# Patient Record
Sex: Male | Born: 1940 | Race: White | Hispanic: No | Smoking: Never smoker
Health system: Southern US, Community
[De-identification: ages and names within clinical notes are randomized; demographics above are authoritative.]

## PROBLEM LIST (undated history)

## (undated) DIAGNOSIS — K219 Gastro-esophageal reflux disease without esophagitis: Secondary | ICD-10-CM

## (undated) DIAGNOSIS — I2510 Atherosclerotic heart disease of native coronary artery without angina pectoris: Secondary | ICD-10-CM

## (undated) DIAGNOSIS — R55 Syncope and collapse: Secondary | ICD-10-CM

## (undated) DIAGNOSIS — G459 Transient cerebral ischemic attack, unspecified: Secondary | ICD-10-CM

## (undated) DIAGNOSIS — E785 Hyperlipidemia, unspecified: Secondary | ICD-10-CM

## (undated) DIAGNOSIS — I219 Acute myocardial infarction, unspecified: Secondary | ICD-10-CM

## (undated) DIAGNOSIS — I493 Ventricular premature depolarization: Secondary | ICD-10-CM

## (undated) DIAGNOSIS — I452 Bifascicular block: Secondary | ICD-10-CM

## (undated) HISTORY — DX: Bifascicular block: I45.2

## (undated) HISTORY — DX: Gastro-esophageal reflux disease without esophagitis: K21.9

## (undated) HISTORY — DX: Transient cerebral ischemic attack, unspecified: G45.9

## (undated) HISTORY — DX: Syncope and collapse: R55

## (undated) HISTORY — DX: Ventricular premature depolarization: I49.3

---

## 2003-09-03 HISTORY — PX: INGUINAL HERNIA REPAIR: SUR1180

## 2009-02-09 ENCOUNTER — Encounter: Payer: Self-pay | Admitting: Gastroenterology

## 2013-09-02 HISTORY — PX: CATARACT EXTRACTION: SUR2

## 2015-09-03 HISTORY — PX: INGUINAL HERNIA REPAIR: SUR1180

## 2015-10-12 DIAGNOSIS — L490 Exfoliation due to erythematous condition involving less than 10 percent of body surface: Secondary | ICD-10-CM | POA: Diagnosis not present

## 2015-10-12 DIAGNOSIS — R600 Localized edema: Secondary | ICD-10-CM | POA: Diagnosis not present

## 2015-10-12 DIAGNOSIS — I83812 Varicose veins of left lower extremities with pain: Secondary | ICD-10-CM | POA: Diagnosis not present

## 2015-11-09 DIAGNOSIS — I83812 Varicose veins of left lower extremities with pain: Secondary | ICD-10-CM | POA: Diagnosis not present

## 2015-11-09 DIAGNOSIS — R600 Localized edema: Secondary | ICD-10-CM | POA: Diagnosis not present

## 2015-11-09 DIAGNOSIS — L490 Exfoliation due to erythematous condition involving less than 10 percent of body surface: Secondary | ICD-10-CM | POA: Diagnosis not present

## 2015-11-21 DIAGNOSIS — E785 Hyperlipidemia, unspecified: Secondary | ICD-10-CM | POA: Diagnosis not present

## 2015-11-21 DIAGNOSIS — Z125 Encounter for screening for malignant neoplasm of prostate: Secondary | ICD-10-CM | POA: Diagnosis not present

## 2015-11-21 DIAGNOSIS — Z1389 Encounter for screening for other disorder: Secondary | ICD-10-CM | POA: Diagnosis not present

## 2015-11-21 DIAGNOSIS — Z0000 Encounter for general adult medical examination without abnormal findings: Secondary | ICD-10-CM | POA: Diagnosis not present

## 2015-11-21 DIAGNOSIS — Z9181 History of falling: Secondary | ICD-10-CM | POA: Diagnosis not present

## 2015-11-21 DIAGNOSIS — Z6825 Body mass index (BMI) 25.0-25.9, adult: Secondary | ICD-10-CM | POA: Diagnosis not present

## 2015-11-21 DIAGNOSIS — Z23 Encounter for immunization: Secondary | ICD-10-CM | POA: Diagnosis not present

## 2015-12-14 DIAGNOSIS — I83812 Varicose veins of left lower extremities with pain: Secondary | ICD-10-CM | POA: Diagnosis not present

## 2015-12-14 DIAGNOSIS — R600 Localized edema: Secondary | ICD-10-CM | POA: Diagnosis not present

## 2015-12-14 DIAGNOSIS — L490 Exfoliation due to erythematous condition involving less than 10 percent of body surface: Secondary | ICD-10-CM | POA: Diagnosis not present

## 2015-12-26 DIAGNOSIS — I83813 Varicose veins of bilateral lower extremities with pain: Secondary | ICD-10-CM | POA: Diagnosis not present

## 2016-01-02 DIAGNOSIS — I83812 Varicose veins of left lower extremities with pain: Secondary | ICD-10-CM | POA: Diagnosis not present

## 2016-01-09 DIAGNOSIS — I83812 Varicose veins of left lower extremities with pain: Secondary | ICD-10-CM | POA: Diagnosis not present

## 2016-01-11 DIAGNOSIS — I83812 Varicose veins of left lower extremities with pain: Secondary | ICD-10-CM | POA: Diagnosis not present

## 2016-01-11 DIAGNOSIS — L490 Exfoliation due to erythematous condition involving less than 10 percent of body surface: Secondary | ICD-10-CM | POA: Diagnosis not present

## 2016-02-06 DIAGNOSIS — I83812 Varicose veins of left lower extremities with pain: Secondary | ICD-10-CM | POA: Diagnosis not present

## 2016-02-12 DIAGNOSIS — H353130 Nonexudative age-related macular degeneration, bilateral, stage unspecified: Secondary | ICD-10-CM | POA: Diagnosis not present

## 2016-02-15 DIAGNOSIS — R600 Localized edema: Secondary | ICD-10-CM | POA: Diagnosis not present

## 2016-02-15 DIAGNOSIS — I83812 Varicose veins of left lower extremities with pain: Secondary | ICD-10-CM | POA: Diagnosis not present

## 2016-06-28 DIAGNOSIS — Z23 Encounter for immunization: Secondary | ICD-10-CM | POA: Diagnosis not present

## 2016-08-12 DIAGNOSIS — H353130 Nonexudative age-related macular degeneration, bilateral, stage unspecified: Secondary | ICD-10-CM | POA: Diagnosis not present

## 2016-08-12 DIAGNOSIS — H524 Presbyopia: Secondary | ICD-10-CM | POA: Diagnosis not present

## 2016-11-22 DIAGNOSIS — Z125 Encounter for screening for malignant neoplasm of prostate: Secondary | ICD-10-CM | POA: Diagnosis not present

## 2016-11-22 DIAGNOSIS — E785 Hyperlipidemia, unspecified: Secondary | ICD-10-CM | POA: Diagnosis not present

## 2016-11-22 DIAGNOSIS — Z6825 Body mass index (BMI) 25.0-25.9, adult: Secondary | ICD-10-CM | POA: Diagnosis not present

## 2016-11-22 DIAGNOSIS — Z0000 Encounter for general adult medical examination without abnormal findings: Secondary | ICD-10-CM | POA: Diagnosis not present

## 2017-02-17 DIAGNOSIS — H353130 Nonexudative age-related macular degeneration, bilateral, stage unspecified: Secondary | ICD-10-CM | POA: Diagnosis not present

## 2017-06-20 DIAGNOSIS — Z23 Encounter for immunization: Secondary | ICD-10-CM | POA: Diagnosis not present

## 2017-08-13 DIAGNOSIS — H353130 Nonexudative age-related macular degeneration, bilateral, stage unspecified: Secondary | ICD-10-CM | POA: Diagnosis not present

## 2017-08-13 DIAGNOSIS — H524 Presbyopia: Secondary | ICD-10-CM | POA: Diagnosis not present

## 2017-11-25 DIAGNOSIS — Z125 Encounter for screening for malignant neoplasm of prostate: Secondary | ICD-10-CM | POA: Diagnosis not present

## 2017-11-25 DIAGNOSIS — E785 Hyperlipidemia, unspecified: Secondary | ICD-10-CM | POA: Diagnosis not present

## 2017-11-25 DIAGNOSIS — Z0000 Encounter for general adult medical examination without abnormal findings: Secondary | ICD-10-CM | POA: Diagnosis not present

## 2017-11-25 DIAGNOSIS — Z6825 Body mass index (BMI) 25.0-25.9, adult: Secondary | ICD-10-CM | POA: Diagnosis not present

## 2018-02-02 DIAGNOSIS — H353131 Nonexudative age-related macular degeneration, bilateral, early dry stage: Secondary | ICD-10-CM | POA: Diagnosis not present

## 2018-06-24 DIAGNOSIS — Z23 Encounter for immunization: Secondary | ICD-10-CM | POA: Diagnosis not present

## 2019-01-27 DIAGNOSIS — H353130 Nonexudative age-related macular degeneration, bilateral, stage unspecified: Secondary | ICD-10-CM | POA: Diagnosis not present

## 2019-01-27 DIAGNOSIS — H524 Presbyopia: Secondary | ICD-10-CM | POA: Diagnosis not present

## 2019-02-01 DIAGNOSIS — Z0000 Encounter for general adult medical examination without abnormal findings: Secondary | ICD-10-CM | POA: Diagnosis not present

## 2019-02-01 DIAGNOSIS — E785 Hyperlipidemia, unspecified: Secondary | ICD-10-CM | POA: Diagnosis not present

## 2019-02-01 DIAGNOSIS — Z125 Encounter for screening for malignant neoplasm of prostate: Secondary | ICD-10-CM | POA: Diagnosis not present

## 2019-02-01 DIAGNOSIS — Z1331 Encounter for screening for depression: Secondary | ICD-10-CM | POA: Diagnosis not present

## 2019-04-20 ENCOUNTER — Encounter: Payer: Self-pay | Admitting: Gastroenterology

## 2019-06-08 DIAGNOSIS — Z1331 Encounter for screening for depression: Secondary | ICD-10-CM | POA: Diagnosis not present

## 2019-06-08 DIAGNOSIS — Z125 Encounter for screening for malignant neoplasm of prostate: Secondary | ICD-10-CM | POA: Diagnosis not present

## 2019-06-08 DIAGNOSIS — Z9181 History of falling: Secondary | ICD-10-CM | POA: Diagnosis not present

## 2019-06-08 DIAGNOSIS — Z0000 Encounter for general adult medical examination without abnormal findings: Secondary | ICD-10-CM | POA: Diagnosis not present

## 2019-06-12 DIAGNOSIS — Z23 Encounter for immunization: Secondary | ICD-10-CM | POA: Diagnosis not present

## 2019-08-25 DIAGNOSIS — H353132 Nonexudative age-related macular degeneration, bilateral, intermediate dry stage: Secondary | ICD-10-CM | POA: Diagnosis not present

## 2020-02-02 DIAGNOSIS — Z0000 Encounter for general adult medical examination without abnormal findings: Secondary | ICD-10-CM | POA: Diagnosis not present

## 2020-02-02 DIAGNOSIS — E785 Hyperlipidemia, unspecified: Secondary | ICD-10-CM | POA: Diagnosis not present

## 2020-02-23 DIAGNOSIS — H524 Presbyopia: Secondary | ICD-10-CM | POA: Diagnosis not present

## 2021-02-05 DIAGNOSIS — Z6824 Body mass index (BMI) 24.0-24.9, adult: Secondary | ICD-10-CM | POA: Diagnosis not present

## 2021-02-05 DIAGNOSIS — Z0000 Encounter for general adult medical examination without abnormal findings: Secondary | ICD-10-CM | POA: Diagnosis not present

## 2021-04-04 DIAGNOSIS — H353132 Nonexudative age-related macular degeneration, bilateral, intermediate dry stage: Secondary | ICD-10-CM | POA: Diagnosis not present

## 2021-04-04 DIAGNOSIS — H524 Presbyopia: Secondary | ICD-10-CM | POA: Diagnosis not present

## 2021-06-18 DIAGNOSIS — Z9181 History of falling: Secondary | ICD-10-CM | POA: Diagnosis not present

## 2021-06-18 DIAGNOSIS — Z0000 Encounter for general adult medical examination without abnormal findings: Secondary | ICD-10-CM | POA: Diagnosis not present

## 2021-06-18 DIAGNOSIS — E785 Hyperlipidemia, unspecified: Secondary | ICD-10-CM | POA: Diagnosis not present

## 2021-06-18 DIAGNOSIS — Z1331 Encounter for screening for depression: Secondary | ICD-10-CM | POA: Diagnosis not present

## 2021-06-22 DIAGNOSIS — Z23 Encounter for immunization: Secondary | ICD-10-CM | POA: Diagnosis not present

## 2021-10-09 DIAGNOSIS — Z961 Presence of intraocular lens: Secondary | ICD-10-CM | POA: Diagnosis not present

## 2021-10-09 DIAGNOSIS — H353132 Nonexudative age-related macular degeneration, bilateral, intermediate dry stage: Secondary | ICD-10-CM | POA: Diagnosis not present

## 2021-10-09 DIAGNOSIS — H524 Presbyopia: Secondary | ICD-10-CM | POA: Diagnosis not present

## 2021-10-09 DIAGNOSIS — H43393 Other vitreous opacities, bilateral: Secondary | ICD-10-CM | POA: Diagnosis not present

## 2021-11-11 DIAGNOSIS — G930 Cerebral cysts: Secondary | ICD-10-CM | POA: Diagnosis not present

## 2021-11-11 DIAGNOSIS — I6603 Occlusion and stenosis of bilateral middle cerebral arteries: Secondary | ICD-10-CM | POA: Diagnosis not present

## 2021-11-11 DIAGNOSIS — G459 Transient cerebral ischemic attack, unspecified: Secondary | ICD-10-CM | POA: Diagnosis not present

## 2021-11-11 DIAGNOSIS — R531 Weakness: Secondary | ICD-10-CM | POA: Diagnosis not present

## 2021-11-11 DIAGNOSIS — I361 Nonrheumatic tricuspid (valve) insufficiency: Secondary | ICD-10-CM | POA: Diagnosis not present

## 2021-11-11 DIAGNOSIS — Z7982 Long term (current) use of aspirin: Secondary | ICD-10-CM | POA: Diagnosis not present

## 2021-11-11 DIAGNOSIS — I340 Nonrheumatic mitral (valve) insufficiency: Secondary | ICD-10-CM | POA: Diagnosis not present

## 2021-11-11 DIAGNOSIS — R55 Syncope and collapse: Secondary | ICD-10-CM | POA: Diagnosis not present

## 2021-11-12 DIAGNOSIS — G930 Cerebral cysts: Secondary | ICD-10-CM | POA: Diagnosis not present

## 2021-11-12 DIAGNOSIS — R531 Weakness: Secondary | ICD-10-CM | POA: Diagnosis not present

## 2021-11-12 DIAGNOSIS — G459 Transient cerebral ischemic attack, unspecified: Secondary | ICD-10-CM | POA: Diagnosis not present

## 2021-11-12 DIAGNOSIS — R29898 Other symptoms and signs involving the musculoskeletal system: Secondary | ICD-10-CM | POA: Diagnosis not present

## 2021-11-12 DIAGNOSIS — R55 Syncope and collapse: Secondary | ICD-10-CM | POA: Diagnosis not present

## 2021-11-14 DIAGNOSIS — R55 Syncope and collapse: Secondary | ICD-10-CM | POA: Diagnosis not present

## 2021-11-14 DIAGNOSIS — I452 Bifascicular block: Secondary | ICD-10-CM | POA: Diagnosis not present

## 2021-11-14 DIAGNOSIS — G459 Transient cerebral ischemic attack, unspecified: Secondary | ICD-10-CM | POA: Diagnosis not present

## 2021-11-14 DIAGNOSIS — I493 Ventricular premature depolarization: Secondary | ICD-10-CM | POA: Diagnosis not present

## 2021-11-20 ENCOUNTER — Encounter: Payer: Self-pay | Admitting: Cardiology

## 2021-12-06 ENCOUNTER — Encounter: Payer: Self-pay | Admitting: Cardiology

## 2021-12-06 ENCOUNTER — Ambulatory Visit: Payer: Medicare Other | Admitting: Cardiology

## 2021-12-06 ENCOUNTER — Ambulatory Visit (INDEPENDENT_AMBULATORY_CARE_PROVIDER_SITE_OTHER): Payer: Medicare Other

## 2021-12-06 VITALS — BP 136/78 | HR 90 | Ht 69.5 in | Wt 170.8 lb

## 2021-12-06 DIAGNOSIS — R079 Chest pain, unspecified: Secondary | ICD-10-CM | POA: Diagnosis not present

## 2021-12-06 DIAGNOSIS — I10 Essential (primary) hypertension: Secondary | ICD-10-CM

## 2021-12-06 DIAGNOSIS — I25709 Atherosclerosis of coronary artery bypass graft(s), unspecified, with unspecified angina pectoris: Secondary | ICD-10-CM | POA: Diagnosis not present

## 2021-12-06 DIAGNOSIS — I2510 Atherosclerotic heart disease of native coronary artery without angina pectoris: Secondary | ICD-10-CM

## 2021-12-06 DIAGNOSIS — R55 Syncope and collapse: Secondary | ICD-10-CM

## 2021-12-06 DIAGNOSIS — E785 Hyperlipidemia, unspecified: Secondary | ICD-10-CM | POA: Insufficient documentation

## 2021-12-06 DIAGNOSIS — I493 Ventricular premature depolarization: Secondary | ICD-10-CM | POA: Insufficient documentation

## 2021-12-06 HISTORY — DX: Atherosclerotic heart disease of native coronary artery without angina pectoris: I25.10

## 2021-12-06 HISTORY — DX: Hyperlipidemia, unspecified: E78.5

## 2021-12-06 HISTORY — DX: Ventricular premature depolarization: I49.3

## 2021-12-06 HISTORY — DX: Essential (primary) hypertension: I10

## 2021-12-06 MED ORDER — ROSUVASTATIN CALCIUM 10 MG PO TABS: 10.0000 mg | ORAL_TABLET | Freq: Every day | ORAL | 3 refills | Status: DC

## 2021-12-06 MED ORDER — NITROGLYCERIN 0.4 MG SL SUBL: 0.4000 mg | SUBLINGUAL_TABLET | SUBLINGUAL | 2 refills | Status: DC | PRN

## 2021-12-06 MED ORDER — METOPROLOL TARTRATE 50 MG PO TABS: 50.0000 mg | ORAL_TABLET | Freq: Once | ORAL | 0 refills | Status: DC

## 2021-12-06 MED ORDER — METOPROLOL TARTRATE 25 MG PO TABS: 25.0000 mg | ORAL_TABLET | Freq: Two times a day (BID) | ORAL | 3 refills | Status: DC

## 2021-12-06 NOTE — Patient Instructions (Signed)
Medication Instructions:  ?Your physician has recommended you make the following change in your medication:  ? ?START: Metoprolol Tartrate 25 mg twice daily ?START: Crestor 10 mg daily ?START: Nitroglycerin 0.4 mg place 1 tablet under the tongue every five minutes as needed for chest pain ?*If you need a refill on your cardiac medications before your next appointment, please call your pharmacy* ? ? ?Lab Work: ? ?BMP 1 week before CT ? ?If you have labs (blood work) drawn today and your tests are completely normal, you will receive your results only by: ?MyChart Message (if you have MyChart) OR ?A paper copy in the mail ?If you have any lab test that is abnormal or we need to change your treatment, we will call you to review the results. ? ? ?Testing/Procedures: ?A zio monitor was ordered today. It will remain on for 14 days. You will then return monitor and event diary in provided box. It takes 1-2 weeks for report to be downloaded and returned to [place]. We will call you with the results. If monitor falls off or has orange flashing light, please call Zio for further instructions.  ? ? ? ?Your cardiac CT will be scheduled at one of the below locations:  ? ?[facility] ?[address] ?[place], [place] [address] ?[phone] ? ?OR ? ?[facility] [address] ?[address] ?[place],  [address] ?[phone] ? ?If scheduled at [facility], please arrive at the [facility] and Children's Entrance (Entrance C2) of [facility] 30 minutes prior to test start time. ?You can use the FREE valet parking offered at entrance C (encouraged to control the heart rate for the test)  ?Proceed to the [facility] Radiology Department (first floor) to check-in and test prep. ? ?All radiology patients and guests should use entrance C2 at [facility], accessed from [facility], even though the hospital's physical address listed is [address] ? ? ? ?If scheduled at [facility], please arrive 15 mins early for check-in and test prep. ? ?Please follow these instructions carefully (unless otherwise directed): ? ?On the Night Before the Test: ?Be sure to Drink plenty of water. ?Do not consume any caffeinated/decaffeinated beverages or chocolate 12 hours prior to your test. ?Do not take any antihistamines 12 hours prior to your test. ? ?On the Day of the Test: ?Drink plenty of water until 1 hour prior to the test. ?Do not eat any food 4 hours prior to the test. ?You may take your regular medications prior to the test.  ?Take metoprolol (Lopressor) two hours prior to test.   ? ?     ?After the Test: ?Drink plenty of water. ?After receiving IV contrast, you may experience a mild flushed feeling. This is normal. ?On occasion, you may experience a mild rash up to 24 hours after the test. This is not dangerous. If this occurs, you can take Benadryl 25 mg and increase your fluid intake. ?If you experience trouble breathing, this can be serious. If it is severe call 911 IMMEDIATELY. If it is mild, please call our office. ? ?We will call to schedule your test 2-4 weeks out understanding that some insurance companies will need an authorization prior to the service being performed.  ? ?For non-scheduling related questions, please contact the cardiac imaging nurse navigator should you have any questions/concerns: ?Marchia Bond, Cardiac Imaging Nurse Navigator ?Gordy Clement, Cardiac Imaging Nurse Navigator ?[facility] ?Direct Office Dial: [phone]  ? ?For scheduling needs, including cancellations and rescheduling, please call [place], [phone]. ? ? ? ?Follow-Up: ?At [facility], you and your health needs are our priority.  As part of our continuing mission to provide you with exceptional heart care, we have created designated Provider Care Teams.  These Care Teams include your primary  Cardiologist (physician) and Advanced Practice Providers (APPs -  Physician Assistants and Nurse Practitioners) who all work together to provide you with the care you need, when you need it. ? ?We recommend signing up for the patient portal called "MyChart".  Sign up information is provided on this After Visit Summary.  MyChart is used to connect with patients for Virtual Visits (Telemedicine).  Patients are able to view lab/test results, encounter notes, upcoming appointments, etc.  Non-urgent messages can be sent to your provider as well.   ?To learn more about what you can do with MyChart, go to [email or web address].   ? ?Your next appointment:   ?2 month(s) ? ?The format for your next appointment:   ?In Person ? ?Provider:   ?Jenne Campus, MD  ? ? ?Other Instructions ?None ? ?

## 2021-12-06 NOTE — Progress Notes (Signed)
? ?Cardiology Consultation:   ? ?Date:  12/06/2021  ? ?ID:  Roger Smith, DOB 1941/08/21, MRN [id number] ? ?PCP:  Paulina FusiSchultz, Douglas E, MD  ?Cardiologist:  Gypsy Balsamobert Skarleth Delmonico, MD  ? ?Referring MD: Paulina FusiSchultz, Douglas E, MD  ? ?Chief Complaint  ?Patient presents with  ? Chest Pain  ? Loss of Consciousness  ?  11/11/21 first episode/ Ongoing chest pain for 3 weeks   ? ? ?History of Present Illness:   ? ?Roger Smith is a 81 y.o. male who is being seen today for the evaluation of syncope at the request of Paulina FusiSchultz, Douglas E, MD. past medical history significant for essential hypertension, dyslipidemia, right bundle branch block, recently he ended up being in the hospital because of episode of syncope.  It happened on Sunday he went to church he ate some 2 donuts before he went there then he was sitting for about 1 hour listening to preacher preaching after that he got up to sink he became dizzy swimmy headed for couple seconds and eventually ended up sitting down and then passed out.  When he woke up a lot of people were around him and he was profoundly sweating he did not bite his tongue he did not wet himself.  He refused to go to hospital however his daughter convinced him to go in the hospital quite extensive evaluation has been done which included echocardiogram which show segmental wall motion abnormalities apparently he did have hypokinesis involving inferior wall.  Overall ejection fraction was 50%.  Interestingly EKG did not show evidence of Q-wave inferiorly.  He started get even more interesting.  He describes episodes of chest pain that happened with extreme exertion for example while he is cutting grass and has been going on for more than a year.  He stops that sensation goes away he describes fairly typical angina pectoris.  He never smoked, he is wife used to be my patient that she actually had severe cardiomyopathy in the pending heart transplant and leaflet that for more than 10 years eventually end up dying  from cancer that she develops complication of antirejection medications.  So he knows me very well.  He does not exercise on the regular basis but he still trying to be active and do activity of daily living. ? ?Past Medical History:  ?Diagnosis Date  ? Chronic GERD   ? Frequent PVCs   ? RBBB (right bundle branch block with left anterior fascicular block)   ? Syncope and collapse   ? TIA (transient ischemic attack)   ? ? ?Past Surgical History:  ?Procedure Laterality Date  ? CATARACT EXTRACTION Bilateral 2015  ? INGUINAL HERNIA REPAIR Right 2005  ? INGUINAL HERNIA REPAIR Left 2017  ? Dr. Dimas ChyleGimenez  ? ? ?Current Medications: ?Current Meds  ?Medication Sig  ? aspirin EC 81 MG tablet Take 81 mg by mouth daily. Swallow whole.  ? multivitamin-lutein (OCUVITE-LUTEIN) CAPS capsule Take 1 capsule by mouth 2 (two) times daily.  ? Omega-3 Fatty Acids (FISH OIL) 1200 MG CAPS Take 1,200 mg by mouth daily.  ?  ? ?Allergies:   Patient has no known allergies.  ? ?Social History  ? ?Socioeconomic History  ? Marital status: Widowed  ?  Spouse name: Not on file  ? Number of children: Not on file  ? Years of education: Not on file  ? Highest education level: Not on file  ?Occupational History  ? Not on file  ?Tobacco Use  ? Smoking status: Never  ?  Smokeless tobacco: Never  ?Substance and Sexual Activity  ? Alcohol use: Never  ? Drug use: Never  ? Sexual activity: Not on file  ?Other Topics Concern  ? Not on file  ?Social History Narrative  ? Not on file  ? ?Social Determinants of Health  ? ?Financial Resource Strain: Not on file  ?Food Insecurity: Not on file  ?Transportation Needs: Not on file  ?Physical Activity: Not on file  ?Stress: Not on file  ?Social Connections: Not on file  ?  ? ?Family History: ?The patient's family history includes Heart attack in his mother; Heart disease in his mother; Stomach cancer in his maternal grandfather. ?ROS:   ?Please see the history of present illness.    ?All 14 point review of systems negative  except as described per history of present illness. ? ?EKGs/Labs/Other Studies Reviewed:   ? ?The following studies were reviewed today: ?I did review record from hospital including echocardiogram showing inferior wall hypokinesis ejection fraction 50% ? ?EKG:  EKG is  ordered today.  The ekg ordered today demonstrates normal sinus rhythm, right bundle branch block, PVCs look like origin of this PVCs is RVOT, 1 ventricular couplets noted. ? ?Recent Labs: ?No results found for requested labs within last 8760 hours.  ?Recent Lipid Panel ?No results found for: CHOL, TRIG, HDL, CHOLHDL, VLDL, LDLCALC, LDLDIRECT ? ?Physical Exam:   ? ?VS:  BP 136/78 (BP Location: Left Arm, Patient Position: Sitting)   Pulse 90   Ht 5' 9.5" (1.765 m)   Wt 170 lb 12.8 oz (77.5 kg)   SpO2 96%   BMI 24.86 kg/m?    ? ?Wt Readings from Last 3 Encounters:  ?12/06/21 170 lb 12.8 oz (77.5 kg)  ?11/14/21 169 lb (76.7 kg)  ?  ? ?GEN:  Well nourished, well developed in no acute distress ?HEENT: Normal ?NECK: No JVD; No carotid bruits ?LYMPHATICS: No lymphadenopathy ?CARDIAC: RRR, no murmurs, no rubs, no gallops ?RESPIRATORY:  Clear to auscultation without rales, wheezing or rhonchi  ?ABDOMEN: Soft, non-tender, non-distended ?MUSCULOSKELETAL:  No edema; No deformity  ?SKIN: Warm and dry ?NEUROLOGIC:  Alert and oriented x 3 ?PSYCHIATRIC:  Normal affect  ? ?ASSESSMENT:   ? ?1. Coronary artery disease involving coronary bypass graft of native heart with angina pectoris (HCC)   ?2. Essential hypertension   ?3. Dyslipidemia   ?4. Syncope and collapse   ?5. Ventricular ectopy   ? ?PLAN:   ? ?In order of problems listed above: ? ?Coronary disease look like he may have had inferior wall myocardial infarction on top of that he described to have fairly typical angina pectoris which appears to be stable.  He is already on antiplatelet therapy which I will continue.  I will put him on metoprolol 25 mg twice daily, I will give him prescription for  nitroglycerin.  He will be scheduled to have coronary CT angio to have better understanding of the significance of those findings.  He also is also told to call 911 if pain is not relieved by nitroglycerin. ?Essential hypertension blood pressure seems to well controlled continue present management. 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  I will ask disease to put Zio patch for 2 weeks to see if he had any significant arrhythmia.  In the meantime we will use beta-blocker to manage palpitations. ?Dyslipidemia he is without any medications however  AST is look like he does have evidence of myocardial infarction I will put him on statin we will put him on Crestor 10 he is LDL was 128 HDL 51 this is a last year. ? ? ?Medication Adjustments/Labs and Tests Ordered: ?Current medicines are reviewed at length with the patient today.  Concerns regarding medicines are outlined above.  ?No orders of the defined types were placed in this encounter. ? ?No orders of the defined types were placed in this encounter. ? ? ?Signed, ?Georgeanna Lea, MD, [facility]. ?12/06/2021 11:03 AM    ?[facility] ?

## 2021-12-13 DIAGNOSIS — R079 Chest pain, unspecified: Secondary | ICD-10-CM | POA: Diagnosis not present

## 2021-12-13 DIAGNOSIS — I10 Essential (primary) hypertension: Secondary | ICD-10-CM | POA: Diagnosis not present

## 2021-12-13 DIAGNOSIS — I25709 Atherosclerosis of coronary artery bypass graft(s), unspecified, with unspecified angina pectoris: Secondary | ICD-10-CM | POA: Diagnosis not present

## 2021-12-13 DIAGNOSIS — E785 Hyperlipidemia, unspecified: Secondary | ICD-10-CM | POA: Diagnosis not present

## 2021-12-14 LAB — BASIC METABOLIC PANEL
BUN/Creatinine Ratio: 15 (ref 10–24)
BUN: 12 mg/dL (ref 8–27)
CO2: 26 mmol/L (ref 20–29)
Calcium: 10.5 mg/dL — ABNORMAL HIGH (ref 8.6–10.2)
Chloride: 106 mmol/L (ref 96–106)
Creatinine, Ser: 0.82 mg/dL (ref 0.76–1.27)
Glucose: 98 mg/dL (ref 70–99)
Potassium: 4.9 mmol/L (ref 3.5–5.2)
Sodium: 145 mmol/L — ABNORMAL HIGH (ref 134–144)
eGFR: 89 mL/min/{1.73_m2} (ref >59)

## 2021-12-19 ENCOUNTER — Telehealth (HOSPITAL_COMMUNITY): Payer: Self-pay | Admitting: *Deleted

## 2021-12-19 NOTE — Telephone Encounter (Signed)
Reaching out to patient to offer assistance regarding upcoming cardiac imaging study; pt verbalizes understanding of appt date/time, parking situation and where to check in, pre-test NPO status and medications ordered, and verified current allergies; name and call back number provided for further questions should they arise ? ?Larey Brick RN Navigator Cardiac Imaging ?[facility] ?[phone] office ?[phone] cell ? ?Patient to take 75mg  metoprolol tartrate two hours prior to his cardiac CT scan.  He is aware to arrive at 9:30am. ?

## 2021-12-20 ENCOUNTER — Encounter (HOSPITAL_COMMUNITY): Payer: Self-pay

## 2021-12-20 ENCOUNTER — Ambulatory Visit (HOSPITAL_COMMUNITY)
Admission: RE | Admit: 2021-12-20 | Discharge: 2021-12-20 | Disposition: A | Discharge status: Home / Self Care | Payer: Medicare Other | Source: Ambulatory Visit | Attending: Cardiology | Admitting: Cardiology

## 2021-12-20 DIAGNOSIS — I2510 Atherosclerotic heart disease of native coronary artery without angina pectoris: Secondary | ICD-10-CM | POA: Insufficient documentation

## 2021-12-20 DIAGNOSIS — R079 Chest pain, unspecified: Secondary | ICD-10-CM | POA: Diagnosis not present

## 2021-12-20 MED ORDER — NITROGLYCERIN 0.4 MG SL SUBL
0.8000 mg | SUBLINGUAL_TABLET | Freq: Once | SUBLINGUAL | Status: AC
  Administered 2021-12-20: 0.8 mg via SUBLINGUAL

## 2021-12-20 MED ORDER — NITROGLYCERIN 0.4 MG SL SUBL
SUBLINGUAL_TABLET | SUBLINGUAL | Status: AC
Start: 2021-12-20 — End: 2021-12-20
  Filled 2021-12-20: qty 2

## 2021-12-20 MED ORDER — IOHEXOL 350 MG/ML SOLN
100.0000 mL | Freq: Once | INTRAVENOUS | Status: AC | PRN
  Administered 2021-12-20: 100 mL via INTRAVENOUS

## 2021-12-21 ENCOUNTER — Telehealth: Payer: Self-pay

## 2021-12-21 NOTE — Telephone Encounter (Signed)
-----   Message from Georgeanna Lea, MD sent at 12/21/2021  8:24 AM EDT ----- ?Significantly abnormal coronary CT angio.  He need to have a follow-up appointment to talk about options most likely require cardiac catheterization ?

## 2021-12-21 NOTE — Telephone Encounter (Signed)
Patient came by in person, notified of the following per Dr. Raliegh Ip and reschedule on 04/26 at 12:40 ?

## 2021-12-26 ENCOUNTER — Encounter: Payer: Self-pay | Admitting: Cardiology

## 2021-12-26 ENCOUNTER — Ambulatory Visit: Payer: Medicare Other | Admitting: Cardiology

## 2021-12-26 VITALS — BP 110/52 | HR 48 | Ht 69.5 in | Wt 167.4 lb

## 2021-12-26 DIAGNOSIS — I10 Essential (primary) hypertension: Secondary | ICD-10-CM | POA: Diagnosis not present

## 2021-12-26 DIAGNOSIS — I493 Ventricular premature depolarization: Secondary | ICD-10-CM

## 2021-12-26 DIAGNOSIS — E785 Hyperlipidemia, unspecified: Secondary | ICD-10-CM | POA: Diagnosis not present

## 2021-12-26 DIAGNOSIS — I25118 Atherosclerotic heart disease of native coronary artery with other forms of angina pectoris: Secondary | ICD-10-CM | POA: Diagnosis not present

## 2021-12-26 NOTE — Addendum Note (Signed)
Addended by: Baldo Ash D on: 12/26/2021 02:12 PM ? ? Modules accepted: Orders ? ?

## 2021-12-26 NOTE — Patient Instructions (Addendum)
Medication Instructions:  ?No medication restrictions before procedure ? ?  ? ?*If you need a refill on your cardiac medications before your next appointment, please call your pharmacy* ? ? ?Lab Work: ?BMP, CBC today ? ?If you have labs (blood work) drawn today and your tests are completely normal, you will receive your results only by: ?MyChart Message (if you have MyChart) OR ?A paper copy in the mail ?If you have any lab test that is abnormal or we need to change your treatment, we will call you to review the results. ? ? ?Testing/Procedures: ? ?[facility] ?[facility] [place] ?[address] ?[place] [id number] ?Dept: [phone] ?Loc: [phone]: [phone] ? ?Roger Smith  12/26/2021 ? ?You are scheduled for a Cardiac Catheterization on Wednesday, May 3 with Dr. Verne Carrowhristopher McAlhany. ? ?1. Please arrive at the [facility]: [address] [place], [place] [address] at 9:30 AM(This time is two hours before your procedure to ensure your preparation). Free valet parking service is available.  ? ?Special note: Every effort is made to have your procedure done on time. Please understand that emergencies sometimes delay scheduled procedures. ? ?2. Diet: Do not eat solid foods after midnight.  You may have clear liquids until 5 AM upon the day of the procedure. ? ?3. Labs: You will need to have blood drawn on Wednesday, April 26 at [facility]: [address], [place] . You do not need to be fasting. ? ?4. Medication instructions in preparation for your procedure: ? ? Contrast Allergy: No ? ? ?*For reference purposes while preparing patient instructions.   ?Delete this med list prior to printing instructions for patient.* ? ? ?On the morning of your procedure, take Aspirin and any morning medicines NOT listed above.  You may use sips of water. ? ?5. Plan to go home the same day, you will only stay overnight if medically necessary. ?6.  You MUST have a responsible adult to drive you home. ?7. An adult MUST be with you the first 24 hours after you arrive home. ?8. Bring a current list of your medications, and the last time and date medication taken. ?9. Bring ID and current insurance cards. ?10.Please wear clothes that are easy to get on and off and wear slip-on shoes. ? ?Thank you for allowing us to care for you! ?  -- [facility]  ? ? ?Follow-Up: ?At [facility], you and your health needs are our priority.  As part of our continuing mission to provide you with exceptional heart care, we have created designated Provider Care Teams.  These Care Teams include your primary Cardiologist (physician) and Advanced Practice Providers (APPs -  Physician Assistants and Nurse Practitioners) who all work together to provide you with the care you need, when you need it. ? ?We recommend signing up for the patient portal called "MyChart".  Sign up information is provided on this After Visit Summary.  MyChart is used to connect with patients for Virtual Visits (Telemedicine).  Patients are able to view lab/test results, encounter notes, upcoming appointments, etc.  Non-urgent messages can be sent to your provider as well.   ?To learn more about what you can do with MyChart, go to [email or web address].   ? ?Your next appointment:   ?6 week(s) ? ?The format for your next appointment:   ?In Person ? ?Provider:   ?Gypsy Balsamobert Krasowski, MD ? ? ?Other Instructions ? ?Coronary Angiogram  With Stent ?Coronary angiogram with stent placement is a procedure to widen or open a narrow blood vessel of the heart (coronary artery). Arteries may become blocked by cholesterol buildup (plaques) in the lining of the artery wall. When a coronary artery becomes partially blocked, blood flow to that area decreases. This may lead to chest pain or a heart attack (myocardial infarction). ?A stent is a small piece of metal that looks like mesh or spring.  Stent placement may be done as treatment after a heart attack, or to prevent a heart attack if a blocked artery is found by a coronary angiogram. ?Let your health care provider know about: ?Any allergies you have, including allergies to medicines or contrast dye. ?All medicines you are taking, including vitamins, herbs, eye drops, creams, and over-the-counter medicines. ?Any problems you or family members have had with anesthetic medicines. ?Any blood disorders you have. ?Any surgeries you have had. ?Any medical conditions you have, including kidney problems or kidney failure. ?Whether you are pregnant or may be pregnant. ?Whether you are breastfeeding. ?What are the risks? ?Generally, this is a safe procedure. However, serious problems may occur, including: ?Damage to nearby structures or organs, such as the heart, blood vessels, or kidneys. ?A return of blockage. ?Bleeding, infection, or bruising at the insertion site. ?A collection of blood under the skin (hematoma) at the insertion site. ?A blood clot in another part of the body. ?Allergic reaction to medicines or dyes. ?Bleeding into the abdomen (retroperitoneal bleeding). ?Stroke (rare). ?Heart attack (rare). ?What happens before the procedure? ?Staying hydrated ?Follow instructions from your health care provider about hydration, which may include: ?Up to 2 hours before the procedure - you may continue to drink clear liquids, such as water, clear fruit juice, black coffee, and plain tea.  ?  ?Eating and drinking restrictions ?Follow instructions from your health care provider about eating and drinking, which may include: ?8 hours before the procedure - stop eating heavy meals or foods, such as meat, fried foods, or fatty foods. ?6 hours before the procedure - stop eating light meals or foods, such as toast or cereal. ?2 hours before the procedure - stop drinking clear liquids. ?Medicines ?Ask your health care provider about: ?Changing or stopping your regular  medicines. This is especially important if you are taking diabetes medicines or blood thinners. ?Taking medicines such as aspirin and ibuprofen. These medicines can thin your blood. Do not take these medicines unless your health care provider tells you to take them. ?Generally, aspirin is recommended before a thin tube, called a catheter, is passed through a blood vessel and inserted into the heart (cardiac catheterization). ?Taking over-the-counter medicines, vitamins, herbs, and supplements. ?General instructions ?Do not use any products that contain nicotine or tobacco for at least 4 weeks before the procedure. These products include cigarettes, e-cigarettes, and chewing tobacco. If you need help quitting, ask your health care provider. ?Plan to have someone take you home from the hospital or clinic. ?If you will be going home right after the procedure, plan to have someone with you for 24 hours. ?You may have tests and imaging procedures. ?Ask your health care provider: ?How your insertion site will be marked. Ask which artery will be used for the procedure. ?What steps will be taken to help prevent infection. These may include: ?Removing hair at the insertion site. ?Washing skin with a germ-killing soap. ?Taking antibiotic medicine. ?What happens during the procedure? ?An IV will be inserted into one of your veins. ?Electrodes  may be placed on your chest to monitor your heart rate during the procedure. ?You will be given one or more of the following: ?A medicine to help you relax (sedative). ?A medicine to numb the area (local anesthetic) for catheter insertion. ?A small incision will be made for catheter insertion. ?The catheter will be inserted into an artery using a guide wire. The location may be in your groin, your wrist, or the fold of your arm (near your elbow). ?An X-ray procedure (fluoroscopy) will be used to help guide the catheter to the opening of the heart arteries. ?A dye will be injected into the  catheter. X-rays will be taken. The dye helps to show where any narrowing or blockages are located in the arteries. ?Tell your health care provider if you have chest pain or trouble breathing. ?A tiny w

## 2021-12-26 NOTE — Progress Notes (Signed)
?Cardiology Office Note:   ? ?Date:  12/26/2021  ? ?ID:  Roger Smith, DOB [birth year], MRN [id number] ? ?PCP:  Nicoletta Dress, MD  ?Cardiologist:  Jenne Campus, MD   ? ?Referring MD: Nicoletta Dress, MD  ? ?Chief Complaint  ?Patient presents with  ? Results  ? ? ?History of Present Illness:   ? ?Roger Smith is a 81 y.o. male with past medical history significant for essential hypertension, dyslipidemia, right bundle branch block.  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  He described to have some chest pain with exertion.  That happen rather rarely I brought him back to the office today to talk about options for this situation. ? ?Past Medical History:  ?Diagnosis Date  ? Chronic GERD   ? Frequent PVCs   ? RBBB (right bundle branch block with left anterior fascicular block)   ? Syncope and collapse   ? TIA (transient ischemic attack)   ? ? ?Past Surgical History:  ?Procedure Laterality Date  ? CATARACT EXTRACTION Bilateral 2015  ? INGUINAL HERNIA REPAIR Right 2005  ? INGUINAL HERNIA REPAIR Left 2017  ? Dr. Pauletta Browns  ? ? ?Current Medications: ?Current Meds  ?Medication Sig  ? aspirin EC 81 MG tablet Take 81 mg by mouth daily. Swallow whole.  ? famotidine (PEPCID) 40 MG tablet Take 40 mg by mouth daily.  ? metoprolol tartrate (LOPRESSOR) 25 MG tablet Take 1 tablet (25 mg total) by mouth 2 (two) times daily.  ? multivitamin-lutein (OCUVITE-LUTEIN) CAPS capsule Take 1 capsule by mouth 2 (two) times daily.  ? nitroGLYCERIN (NITROSTAT) 0.4 MG SL tablet Place 1 tablet (0.4 mg total) under the tongue every 5 (five) minutes as needed for chest pain.  ? Omega-3 Fatty  Acids (FISH OIL) 1200 MG CAPS Take 1,200 mg by mouth daily.  ? rosuvastatin (CRESTOR) 10 MG tablet Take 1 tablet (10 mg total) by mouth daily.  ? [DISCONTINUED] metoprolol tartrate (LOPRESSOR) 50 MG tablet Take 1 tablet (50 mg total) by mouth once for 1 dose.  ?  ? ?Allergies:   Patient has no known allergies.  ? ?Social History  ? ?Socioeconomic History  ? Marital status: Widowed  ?  Spouse name: Not on file  ? Number of children: Not on file  ? Years of education: Not on file  ? Highest education level: Not on file  ?Occupational History  ? Not on file  ?Tobacco Use  ? Smoking status: Never  ? Smokeless tobacco: Never  ?Substance and Sexual Activity  ? Alcohol use: Never  ? Drug use: Never  ? Sexual activity: Not on file  ?Other Topics Concern  ? Not on file  ?Social History Narrative  ? Not on file  ? ?Social Determinants of Health  ? ?Financial Resource Strain: Not on file  ?Food Insecurity: Not on file  ?Transportation Needs: Not on file  ?Physical Activity: Not on file  ?Stress: Not on file  ?Social Connections: Not on file  ?  ? ?Family History: ?The patient's family history includes Heart attack in his mother; Heart disease in his mother; Stomach cancer in his maternal grandfather. ?ROS:   ?Please see the history of present  illness.    ?All 14 point review of systems negative except as described per history of present illness ? ?EKGs/Labs/Other Studies Reviewed:   ? ?Coronary CT angio showed: ?RCA is a large dominant artery that gives rise to PDA and PLA. There ?is mixed plaque in the proximal portion of this artery with severe ?stenosis of 70-99%. Mid portion of this artery has soft plaques with ?moderate stenosis of 50-69%. ?  ?Left main is a large artery that gives rise to LAD and LCX arteries ?as well as small IM branch. This artery has calcified plaque with ?minimal stenosis of 0-25%. ?  ?LAD is a large vessel that has no large mixed plaque in the proximal ?portion with severe stenosis of 70-99%. This  artery gives rise to ?large diagonal branch that has mixed moderate stenosis of 50-69%. ?  ?LCX is a non-dominant artery that gives rise to one large OM1 ?branch. Proximal portion of this artery has mixed plaque with severe ?stenosis of 70-99% ?  ?Other findings: ?  ?Normal pulmonary vein drainage into the left atrium. ?  ?Normal left atrial appendage without a thrombus. ?  ?Normal size of the pulmonary artery. ?  ?IMPRESSION: ?1. Coronary calcium score of 906. This was 15 percentile for age and ?sex matched control. ?  ?2. Normal coronary origin with right dominance. ?  ?3. CAD-RADS 4 Severe stenosis. (70-99% or > 50% left main) of LAD, ?CX, RCA. Cardiac catheterization or CT FFR is recommended. Consider ?symptom-guided anti-ischemic pharmacotherapy as well as risk factor ?modification per guideline directed care. ?  ? ?Recent Labs: ?12/13/2021: BUN 12; Creatinine, Ser 0.82; Potassium 4.9; Sodium 145  ?Recent Lipid Panel ?No results found for: CHOL, TRIG, HDL, CHOLHDL, VLDL, LDLCALC, LDLDIRECT ? ?Physical Exam:   ? ?VS:  BP (!) 110/52 (BP Location: Left Arm, Patient Position: Sitting)   Pulse (!) 48   Ht 5' 9.5" (1.765 m)   Wt 167 lb 6.4 oz (75.9 kg)   SpO2 92%   BMI 24.37 kg/m?    ? ?Wt Readings from Last 3 Encounters:  ?12/26/21 167 lb 6.4 oz (75.9 kg)  ?12/06/21 170 lb 12.8 oz (77.5 kg)  ?11/14/21 169 lb (76.7 kg)  ?  ? ?GEN:  Well nourished, well developed in no acute distress ?HEENT: Normal ?NECK: No JVD; No carotid bruits ?LYMPHATICS: No lymphadenopathy ?CARDIAC: RRR, no murmurs, no rubs, no gallops ?RESPIRATORY:  Clear to auscultation without rales, wheezing or rhonchi  ?ABDOMEN: Soft, non-tender, non-distended ?MUSCULOSKELETAL:  No edema; No deformity  ?SKIN: Warm and dry ?LOWER EXTREMITIES: no swelling ?NEUROLOGIC:  Alert and oriented x 3 ?PSYCHIATRIC:  Normal affect  ? ?ASSESSMENT:   ? ?1. Coronary artery disease of native artery of native heart with stable angina pectoris ([place])   ?2. Essential  hypertension   ?3. Ventricular ectopy   ?4. Dyslipidemia   ? ?PLAN:   ? ?In order of problems listed above: ? ?Coronary CT angio showing significant narrowing of 3 arteries concern is about triple-vessel disease.  There is a lot of calcium in his coronary arteries we talk about options for this situation my opinion best option is to pursue cardiac catheterization.  Procedure has been explained including all risk benefits alternatives.  He is already on antiplatelet therapy which I will continue, he is also on beta-blocker which I will continue.  He is already on statin.  He will get nitroglycerin to take on as-needed basis and will pursue cardiac catheterization. ?Essential hypertension, blood pressure well controlled continue  present management, ?Dyslipidemia I did review K PN which show me LDL of 128 HDL 51.  We will increase dose of Crestor we will check fasting lipid profile. ? ? ?Medication Adjustments/Labs and Tests Ordered: ?Current medicines are reviewed at length with the patient today.  Concerns regarding medicines are outlined above.  ?No orders of the defined types were placed in this encounter. ? ?Medication changes: No orders of the defined types were placed in this encounter. ? ? ?Signed, ?Park Liter, MD, [facility] ?12/26/2021 1:22 PM    ?[place] ?

## 2021-12-26 NOTE — H&P (View-Only) (Signed)
?Cardiology Office Note:   ? ?Date:  12/26/2021  ? ?ID:  Roger Smith, DOB 07/07/1941, MRN [id number] ? ?PCP:  Schultz, Douglas E, MD  ?Cardiologist:  Jenea Dake, MD   ? ?Referring MD: Schultz, Douglas E, MD  ? ?Chief Complaint  ?Patient presents with  ? Results  ? ? ?History of Present Illness:   ? ?Roger Smith is a 81 y.o. male with past medical history significant for essential hypertension, dyslipidemia, right bundle branch block.  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  He described to have some chest pain with exertion.  That happen rather rarely I brought him back to the office today to talk about options for this situation. ? ?Past Medical History:  ?Diagnosis Date  ? Chronic GERD   ? Frequent PVCs   ? RBBB (right bundle branch block with left anterior fascicular block)   ? Syncope and collapse   ? TIA (transient ischemic attack)   ? ? ?Past Surgical History:  ?Procedure Laterality Date  ? CATARACT EXTRACTION Bilateral 2015  ? INGUINAL HERNIA REPAIR Right 2005  ? INGUINAL HERNIA REPAIR Left 2017  ? Dr. Gimenez  ? ? ?Current Medications: ?Current Meds  ?Medication Sig  ? aspirin EC 81 MG tablet Take 81 mg by mouth daily. Swallow whole.  ? famotidine (PEPCID) 40 MG tablet Take 40 mg by mouth daily.  ? metoprolol tartrate (LOPRESSOR) 25 MG tablet Take 1 tablet (25 mg total) by mouth 2 (two) times daily.  ? multivitamin-lutein (OCUVITE-LUTEIN) CAPS capsule Take 1 capsule by mouth 2 (two) times daily.  ? nitroGLYCERIN (NITROSTAT) 0.4 MG SL tablet Place 1 tablet (0.4 mg total) under the tongue every 5 (five) minutes as needed for chest pain.  ? Omega-3 Fatty  Acids (FISH OIL) 1200 MG CAPS Take 1,200 mg by mouth daily.  ? rosuvastatin (CRESTOR) 10 MG tablet Take 1 tablet (10 mg total) by mouth daily.  ? [DISCONTINUED] metoprolol tartrate (LOPRESSOR) 50 MG tablet Take 1 tablet (50 mg total) by mouth once for 1 dose.  ?  ? ?Allergies:   Patient has no known allergies.  ? ?Social History  ? ?Socioeconomic History  ? Marital status: Widowed  ?  Spouse name: Not on file  ? Number of children: Not on file  ? Years of education: Not on file  ? Highest education level: Not on file  ?Occupational History  ? Not on file  ?Tobacco Use  ? Smoking status: Never  ? Smokeless tobacco: Never  ?Substance and Sexual Activity  ? Alcohol use: Never  ? Drug use: Never  ? Sexual activity: Not on file  ?Other Topics Concern  ? Not on file  ?Social History Narrative  ? Not on file  ? ?Social Determinants of Health  ? ?Financial Resource Strain: Not on file  ?Food Insecurity: Not on file  ?Transportation Needs: Not on file  ?Physical Activity: Not on file  ?Stress: Not on file  ?Social Connections: Not on file  ?  ? ?Family History: ?The patient's family history includes Heart attack in his mother; Heart disease in his mother; Stomach cancer in his maternal grandfather. ?ROS:   ?Please see the history of present   illness.    ?All 14 point review of systems negative except as described per history of present illness ? ?EKGs/Labs/Other Studies Reviewed:   ? ?Coronary CT angio showed: ?RCA is a large dominant artery that gives rise to PDA and PLA. There ?is mixed plaque in the proximal portion of this artery with severe ?stenosis of 70-99%. Mid portion of this artery has soft plaques with ?moderate stenosis of 50-69%. ?  ?Left main is a large artery that gives rise to LAD and LCX arteries ?as well as small IM branch. This artery has calcified plaque with ?minimal stenosis of 0-25%. ?  ?LAD is a large vessel that has no large mixed plaque in the proximal ?portion with severe stenosis of 70-99%. This  artery gives rise to ?large diagonal branch that has mixed moderate stenosis of 50-69%. ?  ?LCX is a non-dominant artery that gives rise to one large OM1 ?branch. Proximal portion of this artery has mixed plaque with severe ?stenosis of 70-99% ?  ?Other findings: ?  ?Normal pulmonary vein drainage into the left atrium. ?  ?Normal left atrial appendage without a thrombus. ?  ?Normal size of the pulmonary artery. ?  ?IMPRESSION: ?1. Coronary calcium score of 906. This was 70 percentile for age and ?sex matched control. ?  ?2. Normal coronary origin with right dominance. ?  ?3. CAD-RADS 4 Severe stenosis. (70-99% or > 50% left main) of LAD, ?CX, RCA. Cardiac catheterization or CT FFR is recommended. Consider ?symptom-guided anti-ischemic pharmacotherapy as well as risk factor ?modification per guideline directed care. ?  ? ?Recent Labs: ?12/13/2021: BUN 12; Creatinine, Ser 0.82; Potassium 4.9; Sodium 145  ?Recent Lipid Panel ?No results found for: CHOL, TRIG, HDL, CHOLHDL, VLDL, LDLCALC, LDLDIRECT ? ?Physical Exam:   ? ?VS:  BP (!) 110/52 (BP Location: Left Arm, Patient Position: Sitting)   Pulse (!) 48   Ht 5' 9.5" (1.765 m)   Wt 167 lb 6.4 oz (75.9 kg)   SpO2 92%   BMI 24.37 kg/m?    ? ?Wt Readings from Last 3 Encounters:  ?12/26/21 167 lb 6.4 oz (75.9 kg)  ?12/06/21 170 lb 12.8 oz (77.5 kg)  ?11/14/21 169 lb (76.7 kg)  ?  ? ?GEN:  Well nourished, well developed in no acute distress ?HEENT: Normal ?NECK: No JVD; No carotid bruits ?LYMPHATICS: No lymphadenopathy ?CARDIAC: RRR, no murmurs, no rubs, no gallops ?RESPIRATORY:  Clear to auscultation without rales, wheezing or rhonchi  ?ABDOMEN: Soft, non-tender, non-distended ?MUSCULOSKELETAL:  No edema; No deformity  ?SKIN: Warm and dry ?LOWER EXTREMITIES: no swelling ?NEUROLOGIC:  Alert and oriented x 3 ?PSYCHIATRIC:  Normal affect  ? ?ASSESSMENT:   ? ?1. Coronary artery disease of native artery of native heart with stable angina pectoris ([facility])   ?2. Essential  hypertension   ?3. Ventricular ectopy   ?4. Dyslipidemia   ? ?PLAN:   ? ?In order of problems listed above: ? ?Coronary CT angio showing significant narrowing of 3 arteries concern is about triple-vessel disease.  There is a lot of calcium in his coronary arteries we talk about options for this situation my opinion best option is to pursue cardiac catheterization.  Procedure has been explained including all risk benefits alternatives.  He is already on antiplatelet therapy which I will continue, he is also on beta-blocker which I will continue.  He is already on statin.  He will get nitroglycerin to take on as-needed basis and will pursue cardiac catheterization. ?Essential hypertension, blood pressure well controlled continue   present management, ?Dyslipidemia I did review K PN which show me LDL of 128 HDL 51.  We will increase dose of Crestor we will check fasting lipid profile. ? ? ?Medication Adjustments/Labs and Tests Ordered: ?Current medicines are reviewed at length with the patient today.  Concerns regarding medicines are outlined above.  ?No orders of the defined types were placed in this encounter. ? ?Medication changes: No orders of the defined types were placed in this encounter. ? ? ?Signed, ?Antavion Bartoszek J. Mairlyn Tegtmeyer, MD, FACC ?12/26/2021 1:22 PM    ?[facility] ?

## 2021-12-27 LAB — CBC
Hematocrit: 42.7 % (ref 37.5–51.0)
Hemoglobin: 14.7 g/dL (ref 13.0–17.7)
MCH: 33.5 pg — ABNORMAL HIGH (ref 26.6–33.0)
MCHC: 34.4 g/dL (ref 31.5–35.7)
MCV: 97 fL (ref 79–97)
Platelets: 241 10*3/uL (ref 150–450)
RBC: 4.39 x10E6/uL (ref 4.14–5.80)
RDW: 12.3 % (ref 11.6–15.4)
WBC: 9 10*3/uL (ref 3.4–10.8)

## 2021-12-27 LAB — BASIC METABOLIC PANEL
BUN/Creatinine Ratio: 13 (ref 10–24)
BUN: 12 mg/dL (ref 8–27)
CO2: 23 mmol/L (ref 20–29)
Calcium: 10.8 mg/dL — ABNORMAL HIGH (ref 8.6–10.2)
Chloride: 104 mmol/L (ref 96–106)
Creatinine, Ser: 0.92 mg/dL (ref 0.76–1.27)
Glucose: 103 mg/dL — ABNORMAL HIGH (ref 70–99)
Potassium: 4.7 mmol/L (ref 3.5–5.2)
Sodium: 141 mmol/L (ref 134–144)
eGFR: 85 mL/min/{1.73_m2} (ref >59)

## 2021-12-28 ENCOUNTER — Telehealth: Payer: Self-pay

## 2021-12-28 NOTE — Telephone Encounter (Signed)
-----   Message from Georgeanna Lea, MD sent at 12/28/2021  9:59 AM EDT ----- ?Labs are looking good, good to proceed with cardiac cath ?

## 2021-12-28 NOTE — Telephone Encounter (Signed)
Patient notified of results.

## 2022-01-01 ENCOUNTER — Telehealth: Payer: Self-pay | Admitting: *Deleted

## 2022-01-01 NOTE — Telephone Encounter (Signed)
Cardiac Catheterization scheduled at [facility] for: Wednesday Jan 02, 2022 11:30 AM ?Arrival time and place: [facility] Main Entrance A at: 9:30 AM ? ? ?No solid food after midnight prior to cath, clear liquids until 5 AM day of procedure. ? ?Medication instructions: ?-Usual morning medications can be taken with sips of water including aspirin 81 mg. ? ?Confirmed patient has responsible adult to drive home post procedure and be with patient first 24 hours after arriving home. ? ?Patient reports no new symptoms concerning for COVID-19/no exposure to COVID-19 in the past 10 days. ? ?Reviewed procedure instructions with patient.  ?

## 2022-01-02 ENCOUNTER — Ambulatory Visit (HOSPITAL_COMMUNITY)
Admission: RE | Disposition: A | Discharge status: Home / Self Care | Payer: Self-pay | Source: Ambulatory Visit | Attending: Cardiovascular Disease

## 2022-01-02 ENCOUNTER — Ambulatory Visit (HOSPITAL_COMMUNITY)
Admission: RE | Admit: 2022-01-02 | Discharge: 2022-01-02 | Disposition: A | Discharge status: Home / Self Care | Payer: Medicare Other | Source: Ambulatory Visit | Attending: Cardiovascular Disease | Admitting: Cardiovascular Disease

## 2022-01-02 ENCOUNTER — Other Ambulatory Visit: Payer: Self-pay

## 2022-01-02 DIAGNOSIS — I4510 Unspecified right bundle-branch block: Secondary | ICD-10-CM | POA: Diagnosis not present

## 2022-01-02 DIAGNOSIS — I2510 Atherosclerotic heart disease of native coronary artery without angina pectoris: Secondary | ICD-10-CM

## 2022-01-02 DIAGNOSIS — I493 Ventricular premature depolarization: Secondary | ICD-10-CM | POA: Insufficient documentation

## 2022-01-02 DIAGNOSIS — I25118 Atherosclerotic heart disease of native coronary artery with other forms of angina pectoris: Secondary | ICD-10-CM | POA: Diagnosis not present

## 2022-01-02 DIAGNOSIS — I10 Essential (primary) hypertension: Secondary | ICD-10-CM | POA: Diagnosis not present

## 2022-01-02 DIAGNOSIS — I2582 Chronic total occlusion of coronary artery: Secondary | ICD-10-CM | POA: Diagnosis not present

## 2022-01-02 DIAGNOSIS — E785 Hyperlipidemia, unspecified: Secondary | ICD-10-CM | POA: Insufficient documentation

## 2022-01-02 HISTORY — PX: LEFT HEART CATH AND CORONARY ANGIOGRAPHY: CATH118249

## 2022-01-02 SURGERY — LEFT HEART CATH AND CORONARY ANGIOGRAPHY
Anesthesia: LOCAL

## 2022-01-02 MED ORDER — HEPARIN SODIUM (PORCINE) 1000 UNIT/ML IJ SOLN
INTRAMUSCULAR | Status: AC
  Filled 2022-01-02: qty 10

## 2022-01-02 MED ORDER — HEPARIN SODIUM (PORCINE) 1000 UNIT/ML IJ SOLN
INTRAMUSCULAR | Status: DC | PRN
  Administered 2022-01-02: 4000 [IU] via INTRAVENOUS

## 2022-01-02 MED ORDER — MIDAZOLAM HCL 2 MG/2ML IJ SOLN
INTRAMUSCULAR | Status: AC
  Filled 2022-01-02: qty 2

## 2022-01-02 MED ORDER — FENTANYL CITRATE (PF) 100 MCG/2ML IJ SOLN
INTRAMUSCULAR | Status: AC
Start: 2022-01-02 — End: ?
  Filled 2022-01-02: qty 2

## 2022-01-02 MED ORDER — LABETALOL HCL 5 MG/ML IV SOLN: 10.0000 mg | INTRAVENOUS | Status: DC | PRN

## 2022-01-02 MED ORDER — HEPARIN (PORCINE) IN NACL 1000-0.9 UT/500ML-% IV SOLN
INTRAVENOUS | Status: AC
  Filled 2022-01-02: qty 500

## 2022-01-02 MED ORDER — SODIUM CHLORIDE 0.9% FLUSH: 3.0000 mL | Freq: Two times a day (BID) | INTRAVENOUS | Status: DC

## 2022-01-02 MED ORDER — SODIUM CHLORIDE 0.9 % WEIGHT BASED INFUSION: 1.0000 mL/kg/h | INTRAVENOUS | Status: DC

## 2022-01-02 MED ORDER — LIDOCAINE HCL (PF) 1 % IJ SOLN
INTRAMUSCULAR | Status: DC | PRN
  Administered 2022-01-02: 2 mL

## 2022-01-02 MED ORDER — SODIUM CHLORIDE 0.9% FLUSH: 3.0000 mL | INTRAVENOUS | Status: DC | PRN

## 2022-01-02 MED ORDER — LIDOCAINE HCL (PF) 1 % IJ SOLN
INTRAMUSCULAR | Status: AC
  Filled 2022-01-02: qty 30

## 2022-01-02 MED ORDER — ASPIRIN 81 MG PO CHEW: 81.0000 mg | CHEWABLE_TABLET | ORAL | Status: DC

## 2022-01-02 MED ORDER — FENTANYL CITRATE (PF) 100 MCG/2ML IJ SOLN
INTRAMUSCULAR | Status: DC | PRN
  Administered 2022-01-02: 25 ug via INTRAVENOUS

## 2022-01-02 MED ORDER — VERAPAMIL HCL 2.5 MG/ML IV SOLN
INTRAVENOUS | Status: AC
  Filled 2022-01-02: qty 2

## 2022-01-02 MED ORDER — HYDRALAZINE HCL 20 MG/ML IJ SOLN: 10.0000 mg | INTRAMUSCULAR | Status: DC | PRN

## 2022-01-02 MED ORDER — SODIUM CHLORIDE 0.9 % IV SOLN
250.0000 mL | INTRAVENOUS | Status: DC | PRN
Start: 2022-01-02 — End: 2022-01-02

## 2022-01-02 MED ORDER — ACETAMINOPHEN 325 MG PO TABS: 650.0000 mg | ORAL_TABLET | ORAL | Status: DC | PRN

## 2022-01-02 MED ORDER — IOHEXOL 350 MG/ML SOLN
INTRAVENOUS | Status: DC | PRN
  Administered 2022-01-02: 75 mL

## 2022-01-02 MED ORDER — HEPARIN (PORCINE) IN NACL 1000-0.9 UT/500ML-% IV SOLN
INTRAVENOUS | Status: DC | PRN
  Administered 2022-01-02 (×2): 500 mL

## 2022-01-02 MED ORDER — ONDANSETRON HCL 4 MG/2ML IJ SOLN: 4.0000 mg | Freq: Four times a day (QID) | INTRAMUSCULAR | Status: DC | PRN

## 2022-01-02 MED ORDER — SODIUM CHLORIDE 0.9 % IV SOLN: 250.0000 mL | INTRAVENOUS | Status: DC | PRN

## 2022-01-02 MED ORDER — SODIUM CHLORIDE 0.9 % WEIGHT BASED INFUSION
3.0000 mL/kg/h | INTRAVENOUS | Status: AC
  Administered 2022-01-02: 3 mL/kg/h via INTRAVENOUS

## 2022-01-02 MED ORDER — SODIUM CHLORIDE 0.9 % IV SOLN: INTRAVENOUS | Status: AC

## 2022-01-02 MED ORDER — MIDAZOLAM HCL 2 MG/2ML IJ SOLN
INTRAMUSCULAR | Status: DC | PRN
Start: 2022-01-02 — End: 2022-01-02
  Administered 2022-01-02: 1 mg via INTRAVENOUS

## 2022-01-02 SURGICAL SUPPLY — 9 items

## 2022-01-02 NOTE — Interval H&P Note (Signed)
History and Physical Interval Note: ? ?01/02/2022 ?9:31 AM ? ?Osmani Laclair  has presented today for surgery, with the diagnosis of abnormal CT angio.  The various methods of treatment have been discussed with the patient and family. After consideration of risks, benefits and other options for treatment, the patient has consented to  Procedure(s): ?LEFT HEART CATH AND CORONARY ANGIOGRAPHY (N/A) as a surgical intervention.  The patient's history has been reviewed, patient examined, no change in status, stable for surgery.  I have reviewed the patient's chart and labs.  Questions were answered to the patient's satisfaction.   ? ?Cath Lab Visit (complete for each Cath Lab visit) ? ?Clinical Evaluation Leading to the Procedure:  ? ?ACS: No. ? ?Non-ACS:   ? ?Anginal Classification: No Symptoms ? ?Anti-ischemic medical therapy: Minimal Therapy (1 class of medications) ? ?Non-Invasive Test Results: High-risk stress test findings: cardiac mortality >3%/year (Coronary CTA with severe three vessel CAD) ? ?Prior CABG: Previous CABG ? ? ? ? ? ? ? ?Lauree Chandler ? ? ?

## 2022-01-03 ENCOUNTER — Encounter (HOSPITAL_COMMUNITY): Payer: Self-pay | Admitting: Cardiovascular Disease

## 2022-01-03 MED FILL — Verapamil HCl IV Soln 2.5 MG/ML: INTRAVENOUS | Qty: 2 | Status: AC

## 2022-01-14 ENCOUNTER — Encounter: Payer: Self-pay | Admitting: Cardiothoracic Surgery

## 2022-01-14 ENCOUNTER — Other Ambulatory Visit: Payer: Self-pay | Admitting: *Deleted

## 2022-01-14 ENCOUNTER — Encounter: Payer: Self-pay | Admitting: *Deleted

## 2022-01-14 ENCOUNTER — Institutional Professional Consult (permissible substitution): Payer: Medicare Other | Admitting: Cardiothoracic Surgery

## 2022-01-14 VITALS — BP 130/73 | HR 64 | Resp 20 | Ht 69.0 in | Wt 168.0 lb

## 2022-01-14 DIAGNOSIS — I25118 Atherosclerotic heart disease of native coronary artery with other forms of angina pectoris: Secondary | ICD-10-CM

## 2022-01-14 NOTE — Progress Notes (Signed)
PCP is Nicoletta Dress, MD ?Referring Provider is Burnell Blanks* ? ?Chief Complaint  ?Patient presents with  ? Coronary Artery Disease  ?  Initial surgical consult, Cath 5/3, ECHO 3/12  ? ? ?HPI: ?Patient examined, images of coronary arteriogram and echocardiogram and cardiac CT personally reviewed and discussed with patient and daughter. ? ?Very nice retired 81 year old nondiabetic non-smoker with recently diagnosed severe three-vessel coronary artery disease.  A syncopal episode in church prompted his evaluation by cardiology in [place].  An echocardiogram showed inferior wall hypokinesia.  Subsequent cardiac CT calcium score was 700.  He admitted to episodes of exertional chest pain which preceded the syncopal episode.  Patient states he had a brain scan after the syncopal episode at [facility] which was normal.  He was never seen by neurology after discharge. ? ?Following the high calcium score he underwent cardiac catheterization here which demonstrated severe three-vessel coronary disease.  LVEDP is 25.  He did not have venogram performed. ? ?The echocardiogram was performed at [facility] for which images are not available.  EF was listed as 35%. ? ?Patient is retired from working at the [employer].  His wife died 48 years after cardiac transplantation from lymphoma.  He has daughters which live close by. ? ?Only prior surgical history of bilateral inguinal hernia repair under general anesthesia without complication or surgical bleeding. ? ?Patient is right-hand dominant.  No previous history of thoracic trauma or pneumothorax. ? ?Patient has had outpatient varicose left leg vein injection in the past.  He has bilateral varicose veins below the knee. ? ?Past Medical History:  ?Diagnosis Date  ? Chronic GERD   ? Frequent PVCs   ? RBBB (right bundle branch block with left anterior fascicular block)   ? Syncope and collapse   ? TIA (transient ischemic attack)   ? ? ?Past Surgical History:   ?Procedure Laterality Date  ? CATARACT EXTRACTION Bilateral 2015  ? INGUINAL HERNIA REPAIR Right 2005  ? INGUINAL HERNIA REPAIR Left 2017  ? Dr. Pauletta Browns  ? LEFT HEART CATH AND CORONARY ANGIOGRAPHY N/A 01/02/2022  ? Procedure: LEFT HEART CATH AND CORONARY ANGIOGRAPHY;  Surgeon: Burnell Blanks, MD;  Location: [place] [facility];  Service: Cardiovascular;  Laterality: N/A;  ? ? ?Family History  ?Problem Relation Age of Onset  ? Heart attack Mother   ? Heart disease Mother   ? Stomach cancer Maternal Grandfather   ? ? ?Social History ?Social History  ? ?Tobacco Use  ? Smoking status: Never  ? Smokeless tobacco: Never  ?Substance Use Topics  ? Alcohol use: Never  ? Drug use: Never  ? ? ?Current Outpatient Medications  ?Medication Sig Dispense Refill  ? aspirin EC 81 MG tablet Take 81 mg by mouth daily. Swallow whole.    ? famotidine (PEPCID) 40 MG tablet Take 40 mg by mouth daily.    ? metoprolol tartrate (LOPRESSOR) 25 MG tablet Take 1 tablet (25 mg total) by mouth 2 (two) times daily. 180 tablet 3  ? multivitamin-lutein (OCUVITE-LUTEIN) CAPS capsule Take 1 capsule by mouth 2 (two) times daily.    ? nitroGLYCERIN (NITROSTAT) 0.4 MG SL tablet Place 1 tablet (0.4 mg total) under the tongue every 5 (five) minutes as needed for chest pain. 35 tablet 2  ? Omega-3 Fatty Acids (FISH OIL) 1200 MG CAPS Take 1,200 mg by mouth daily.    ? rosuvastatin (CRESTOR) 10 MG tablet Take 1 tablet (10 mg total) by mouth daily. 90 tablet 3  ? ?  No current facility-administered medications for this visit.  ? ? ?No Known Allergies ? ?Review of Systems: ?     ? ?           ? ?Review of Systems :  [ y ] = yes, [  ] = no ? ?      General :  Weight gain [   ]    Weight loss  [   ]  Fatigue [  ]  Fever [  ]  Chills  [  ]                                 ? ? ?       HEENT    Headache [  ]  Dizziness [  ]  Blurred vision [  ] Glaucoma  [  ]   ?                       Nosebleeds [  ] Painful or loose teeth [  ] ? ?      Cardiac :  Chest pain/  pressure [ y exertional]  Resting SOB [  ] exertional SOB [  ] ?                       Orthopnea [  ]  Pedal edema  [  ]  Palpitations [  ] Syncope/presyncope [ ]  ?                       Paroxysmal nocturnal dyspnea [  ] ? ?       Pulmonary : cough [  ]  wheezing [  ]  Hemoptysis [  ] Sputum [  ] Snoring [  ] ?                             Pneumothorax [  ]  Sleep apnea [  ] ? ?      GI : Vomiting [  ]  Dysphagia [  ]  Melena  [  ]  Abdominal pain [  ] BRBPR [  ] ?             Heart burn [  ]  Constipation [  ] Diarrhea  [  ] Colonoscopy [   ] ? ?      GU : Hematuria [  ]  Dysuria [  ]  Nocturia [  ] UTI's [  ] ? ?      Vascular : Claudication [  ]  Rest pain [  ]  DVT [  ] Vein stripping [  ] leg ulcers [  ] ?                         TIA [  ] Stroke [  ]  Varicose veins [ y ] ? ?      NEURO :  Headaches  [  ] Seizures [  ] Vision changes [  ] Paresthesias [  ]      history of syncope as noted.  Right-hand-dominant.                                  ? ?  Musculoskeletal :  Arthritis [  ] Gout  [  ]  Back pain [  ]  Joint pain [  ] ? ?      Skin :  Rash [  ]  Melanoma [  ] Sores [  ] ? ?      Heme : Bleeding problems [  ]Clotting Disorders [  ] Anemia [  ]Blood Transfusion [ ]  ? ?      Endocrine : Diabetes [  ] Heat or Cold intolerance [  ] Polyuria [  ]excessive thirst [ ]  ? ?      Psych : Depression [  ]  Anxiety [  ]  Psych hospitalizations [  ] Memory change [  ] ?      ?   ?       ?                   ?         ? ?                   ?         ? ? ?BP 130/73 (BP Location: Right Arm, Patient Position: Sitting)   Pulse 64   Resp 20   Ht 5\' 9"  (1.753 m)   Wt 168 lb (76.2 kg)   SpO2 97% Comment: RA  BMI 24.81 kg/m?  ?Physical Exam: ?    ?Physical Exam ? ?General: Well-nourished but thin 81 year old male who appears to be in good health no distress ?HEENT: Normocephalic pupils equal , dentition adequate bilateral plates ?Neck: Supple without JVD, adenopathy, or bruit ?Chest: Clear to auscultation, symmetrical  breath sounds, no rhonchi, no tenderness ?            or deformity ?Cardiovascular: Regular rate and rhythm, no murmur, no gallop, peripheral pulses ?            palpable in all extremities ?Abdomen:  Soft, nontender, no palpable mass or organomegaly ?Extremities: Warm, well-perfused, no clubbing cyanosis edema or tenderness, ?       Bilateral varicose veins but      no venous stasis changes of the legs ?Rectal/GU: Deferred ?Neuro: Grossly non--focal and symmetrical throughout ?Skin: Clean and dry without rash or ulceration  ? ?Diagnostic Tests: ?Patient has severe three-vessel coronary disease with 95% proximal RCA, ?Total LAD, 80% circumflex and 90% stenosis of the large diagonal. ?LVEF depressed although echo images are pending. 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  Patient would benefit from multivessel bypass grafting for preservation of LV function, improve survival and relief of exertional angina. ? ?Plan: ?CABG will be scheduled at [facility] May 24.  Because of his varicose veins he may need left radial artery as conduit.  Prior to surgery he will receive an echocardiogram to further evaluate his LV function and valves, saphenous vein mapping, and pre-CABG Dopplers to assess carotids. ?I discussed the details of surgery with the patient and his daughter as well as the benefits and risks.  They understand and agree to proceed with the plan for surgery on May 24. ? ?Dahlia Byes, MD ?[facility] ?[phone] ? ? ? ? ?

## 2022-01-18 NOTE — Progress Notes (Signed)
Surgical Instructions    Your procedure is scheduled on Wednesday, 01/23/22.  Report to [facility] Main Entrance "A" at 5:30 A.M., then check in with the Admitting office.  Call this number if you have problems the morning of surgery:  [phone]   If you have any questions prior to your surgery date call [phone]: Open Monday-Friday 8am-4pm    Remember:  Do not eat or drink after midnight the night before your surgery     Take these medicines the morning of surgery with A SIP OF WATER:  famotidine (PEPCID)  metoprolol tartrate (LOPRESSOR) rosuvastatin (CRESTOR) nitroGLYCERIN (NITROSTAT)- if needed  As of today, STOP taking any Aspirin (unless otherwise instructed by your surgeon) Aleve, Naproxen, Ibuprofen, Motrin, Advil, Goody's, BC's, all herbal medications, fish oil, and all vitamins.           Do not wear jewelry or makeup Do not wear lotions, powders, perfumes/colognes, or deodorant. Men may shave face and neck. Do not bring valuables to the hospital. Do not wear nail polish, gel polish, artificial nails, or any other type of covering on natural nails (fingers and toes) If you have artificial nails or gel coating that need to be removed by a nail salon, please have this removed prior to surgery. Artificial nails or gel coating may interfere with anesthesia's ability to adequately monitor your vital signs.  [facility] is not responsible for any belongings or valuables. .   Do NOT Smoke (Tobacco/Vaping)  24 hours prior to your procedure  If you use a CPAP at night, you may bring your mask for your overnight stay.   Contacts, glasses, hearing aids, dentures or partials may not be worn into surgery, please bring cases for these belongings   For patients admitted to the hospital, discharge time will be determined by your treatment team.   Patients discharged the day of surgery will not be allowed to drive home, and someone needs to stay with them for 24  hours.   SURGICAL WAITING ROOM VISITATION Patients having surgery or a procedure in a hospital may have two support people. Children under the age of 15 must have an adult with them who is not the patient. They may stay in the waiting area during the procedure and may switch out with other visitors. If the patient needs to stay at the hospital during part of their recovery, the visitor guidelines for inpatient rooms apply.  Please refer to the [facility] website for the visitor guidelines for Inpatients (after your surgery is over and you are in a regular room).       Special instructions:    Oral Hygiene is also important to reduce your risk of infection.  Remember - BRUSH YOUR TEETH THE MORNING OF SURGERY WITH YOUR REGULAR TOOTHPASTE   [facility]- Preparing For Surgery  Before surgery, you can play an important role. Because skin is not sterile, your skin needs to be as free of germs as possible. You can reduce the number of germs on your skin by washing with CHG (chlorahexidine gluconate) Soap before surgery.  CHG is an antiseptic cleaner which kills germs and bonds with the skin to continue killing germs even after washing.     Please do not use if you have an allergy to CHG or antibacterial soaps. If your skin becomes reddened/irritated stop using the CHG.  Do not shave (including legs and underarms) for at least 48 hours prior to first CHG shower. It is OK to shave your face.  Please follow these instructions carefully.     Shower the NIGHT BEFORE SURGERY and the MORNING OF SURGERY with CHG Soap.   If you chose to wash your hair, wash your hair first as usual with your normal shampoo. After you shampoo, rinse your hair and body thoroughly to remove the shampoo.  Then [employer] and genitals (private parts) with your normal soap and rinse thoroughly to remove soap.  After that Use CHG Soap as you would any other liquid soap. You can apply CHG directly to the skin and wash gently  with a scrungie or a clean washcloth.   Apply the CHG Soap to your body ONLY FROM THE NECK DOWN.  Do not use on open wounds or open sores. Avoid contact with your eyes, ears, mouth and genitals (private parts). Wash Face and genitals (private parts)  with your normal soap.   Wash thoroughly, paying special attention to the area where your surgery will be performed.  Thoroughly rinse your body with warm water from the neck down.  DO NOT shower/wash with your normal soap after using and rinsing off the CHG Soap.  Pat yourself dry with a CLEAN TOWEL.  Wear CLEAN PAJAMAS to bed the night before surgery  Place CLEAN SHEETS on your bed the night before your surgery  DO NOT SLEEP WITH PETS.   Day of Surgery: Take a shower with CHG soap. Wear Clean/Comfortable clothing the morning of surgery Do not apply any deodorants/lotions.   Remember to brush your teeth WITH YOUR REGULAR TOOTHPASTE.    If you received a COVID test during your pre-op visit, it is requested that you wear a mask when out in public, stay away from anyone that may not be feeling well, and notify your surgeon if you develop symptoms. If you have been in contact with anyone that has tested positive in the last 10 days, please notify your surgeon.    Please read over the following fact sheets that you were given.

## 2022-01-21 ENCOUNTER — Ambulatory Visit (HOSPITAL_BASED_OUTPATIENT_CLINIC_OR_DEPARTMENT_OTHER)
Admission: RE | Admit: 2022-01-21 | Discharge: 2022-01-21 | Disposition: A | Discharge status: Home / Self Care | Payer: Medicare Other | Source: Ambulatory Visit | Attending: Cardiothoracic Surgery | Admitting: Cardiothoracic Surgery

## 2022-01-21 ENCOUNTER — Other Ambulatory Visit: Payer: Self-pay

## 2022-01-21 ENCOUNTER — Ambulatory Visit (HOSPITAL_COMMUNITY)
Admission: RE | Admit: 2022-01-21 | Discharge: 2022-01-21 | Disposition: A | Discharge status: Home / Self Care | Payer: Medicare Other | Source: Ambulatory Visit | Attending: Cardiothoracic Surgery | Admitting: Cardiothoracic Surgery

## 2022-01-21 ENCOUNTER — Encounter (HOSPITAL_COMMUNITY)
Admission: RE | Admit: 2022-01-21 | Discharge: 2022-01-21 | Disposition: A | Discharge status: Home / Self Care | Payer: Medicare Other | Source: Ambulatory Visit | Attending: Cardiothoracic Surgery | Admitting: Cardiothoracic Surgery

## 2022-01-21 ENCOUNTER — Encounter (HOSPITAL_COMMUNITY): Payer: Self-pay

## 2022-01-21 VITALS — BP 144/90 | HR 85 | Temp 98.0°F | Resp 18 | Ht 69.0 in | Wt 167.7 lb

## 2022-01-21 DIAGNOSIS — I2510 Atherosclerotic heart disease of native coronary artery without angina pectoris: Secondary | ICD-10-CM | POA: Diagnosis not present

## 2022-01-21 DIAGNOSIS — I340 Nonrheumatic mitral (valve) insufficiency: Secondary | ICD-10-CM | POA: Insufficient documentation

## 2022-01-21 DIAGNOSIS — I25118 Atherosclerotic heart disease of native coronary artery with other forms of angina pectoris: Secondary | ICD-10-CM | POA: Diagnosis not present

## 2022-01-21 DIAGNOSIS — D696 Thrombocytopenia, unspecified: Secondary | ICD-10-CM | POA: Diagnosis not present

## 2022-01-21 DIAGNOSIS — E785 Hyperlipidemia, unspecified: Secondary | ICD-10-CM | POA: Diagnosis not present

## 2022-01-21 DIAGNOSIS — J90 Pleural effusion, not elsewhere classified: Secondary | ICD-10-CM | POA: Diagnosis not present

## 2022-01-21 DIAGNOSIS — Z8673 Personal history of transient ischemic attack (TIA), and cerebral infarction without residual deficits: Secondary | ICD-10-CM | POA: Insufficient documentation

## 2022-01-21 DIAGNOSIS — I10 Essential (primary) hypertension: Secondary | ICD-10-CM | POA: Diagnosis not present

## 2022-01-21 DIAGNOSIS — Z8249 Family history of ischemic heart disease and other diseases of the circulatory system: Secondary | ICD-10-CM | POA: Diagnosis not present

## 2022-01-21 DIAGNOSIS — J811 Chronic pulmonary edema: Secondary | ICD-10-CM | POA: Diagnosis not present

## 2022-01-21 DIAGNOSIS — Z20822 Contact with and (suspected) exposure to covid-19: Secondary | ICD-10-CM | POA: Diagnosis not present

## 2022-01-21 DIAGNOSIS — R531 Weakness: Secondary | ICD-10-CM | POA: Diagnosis not present

## 2022-01-21 DIAGNOSIS — J939 Pneumothorax, unspecified: Secondary | ICD-10-CM | POA: Diagnosis not present

## 2022-01-21 DIAGNOSIS — I252 Old myocardial infarction: Secondary | ICD-10-CM | POA: Diagnosis not present

## 2022-01-21 DIAGNOSIS — I452 Bifascicular block: Secondary | ICD-10-CM | POA: Diagnosis not present

## 2022-01-21 DIAGNOSIS — I8390 Asymptomatic varicose veins of unspecified lower extremity: Secondary | ICD-10-CM | POA: Insufficient documentation

## 2022-01-21 DIAGNOSIS — Z01818 Encounter for other preprocedural examination: Secondary | ICD-10-CM

## 2022-01-21 DIAGNOSIS — E8770 Fluid overload, unspecified: Secondary | ICD-10-CM | POA: Diagnosis not present

## 2022-01-21 DIAGNOSIS — I255 Ischemic cardiomyopathy: Secondary | ICD-10-CM | POA: Diagnosis not present

## 2022-01-21 DIAGNOSIS — D62 Acute posthemorrhagic anemia: Secondary | ICD-10-CM | POA: Diagnosis not present

## 2022-01-21 DIAGNOSIS — Z951 Presence of aortocoronary bypass graft: Secondary | ICD-10-CM | POA: Diagnosis not present

## 2022-01-21 DIAGNOSIS — K219 Gastro-esophageal reflux disease without esophagitis: Secondary | ICD-10-CM | POA: Diagnosis not present

## 2022-01-21 DIAGNOSIS — J9811 Atelectasis: Secondary | ICD-10-CM | POA: Diagnosis not present

## 2022-01-21 DIAGNOSIS — I088 Other rheumatic multiple valve diseases: Secondary | ICD-10-CM | POA: Diagnosis not present

## 2022-01-21 DIAGNOSIS — Z800 Family history of malignant neoplasm of digestive organs: Secondary | ICD-10-CM | POA: Diagnosis not present

## 2022-01-21 HISTORY — DX: Atherosclerotic heart disease of native coronary artery without angina pectoris: I25.10

## 2022-01-21 HISTORY — DX: Acute myocardial infarction, unspecified: I21.9

## 2022-01-21 HISTORY — DX: Hyperlipidemia, unspecified: E78.5

## 2022-01-21 LAB — CBC
HCT: 41.3 % (ref 39.0–52.0)
Hemoglobin: 14.3 g/dL (ref 13.0–17.0)
MCH: 33.8 pg (ref 26.0–34.0)
MCHC: 34.6 g/dL (ref 30.0–36.0)
MCV: 97.6 fL (ref 80.0–100.0)
Platelets: 187 10*3/uL (ref 150–400)
RBC: 4.23 MIL/uL (ref 4.22–5.81)
RDW: 12.4 % (ref 11.5–15.5)
WBC: 10.8 10*3/uL — ABNORMAL HIGH (ref 4.0–10.5)
nRBC: 0 % (ref 0.0–0.2)

## 2022-01-21 LAB — BLOOD GAS, ARTERIAL
Acid-base deficit: 0.2 mmol/L (ref 0.0–2.0)
Bicarbonate: 24 mmol/L (ref 20.0–28.0)
Drawn by: 58793
O2 Saturation: 98.5 %
Patient temperature: 37
pCO2 arterial: 37 mmHg (ref 32–48)
pH, Arterial: 7.42 (ref 7.35–7.45)
pO2, Arterial: 111 mmHg — ABNORMAL HIGH (ref 83–108)

## 2022-01-21 LAB — URINALYSIS, ROUTINE W REFLEX MICROSCOPIC
Bilirubin Urine: NEGATIVE
Glucose, UA: NEGATIVE mg/dL
Hgb urine dipstick: NEGATIVE
Ketones, ur: NEGATIVE mg/dL
Leukocytes,Ua: NEGATIVE
Nitrite: NEGATIVE
Protein, ur: NEGATIVE mg/dL
Specific Gravity, Urine: 1.016 (ref 1.005–1.030)
pH: 6 (ref 5.0–8.0)

## 2022-01-21 LAB — COMPREHENSIVE METABOLIC PANEL
ALT: 15 U/L (ref 0–44)
AST: 18 U/L (ref 15–41)
Albumin: 4 g/dL (ref 3.5–5.0)
Alkaline Phosphatase: 42 U/L (ref 38–126)
Anion gap: 9 (ref 5–15)
BUN: 13 mg/dL (ref 8–23)
CO2: 21 mmol/L — ABNORMAL LOW (ref 22–32)
Calcium: 10.1 mg/dL (ref 8.9–10.3)
Chloride: 108 mmol/L (ref 98–111)
Creatinine, Ser: 0.9 mg/dL (ref 0.61–1.24)
GFR, Estimated: >60 mL/min (ref >60)
Glucose, Bld: 120 mg/dL — ABNORMAL HIGH (ref 70–99)
Potassium: 4.3 mmol/L (ref 3.5–5.1)
Sodium: 138 mmol/L (ref 135–145)
Total Bilirubin: 0.6 mg/dL (ref 0.3–1.2)
Total Protein: 6.9 g/dL (ref 6.5–8.1)

## 2022-01-21 LAB — ECHOCARDIOGRAM COMPLETE
Area-P 1/2: 4.1 cm2
Calc EF: 23.9 %
MV M vel: 4.55 m/s
MV Peak grad: 82.6 mmHg
Radius: 0.3 cm
S' Lateral: 5.4 cm
Single Plane A2C EF: 29.2 %
Single Plane A4C EF: 17.2 %

## 2022-01-21 LAB — HEMOGLOBIN A1C
Hgb A1c MFr Bld: 5.3 % (ref 4.8–5.6)
Mean Plasma Glucose: 105.41 mg/dL

## 2022-01-21 LAB — PROTIME-INR
INR: 1 (ref 0.8–1.2)
Prothrombin Time: 13.6 seconds (ref 11.4–15.2)

## 2022-01-21 LAB — SURGICAL PCR SCREEN
MRSA, PCR: NEGATIVE
Staphylococcus aureus: NEGATIVE

## 2022-01-21 LAB — APTT: aPTT: 31 seconds (ref 24–36)

## 2022-01-21 NOTE — Progress Notes (Signed)
PCP - Dr. Delanna Notice Cardiologist - Dr. Blima Ledger  PPM/ICD - n/a  Chest x-ray - 01/21/22 EKG - 01/21/22 Stress Test - denies ECHO - 01/21/22 Cardiac Cath - 01/02/22  Sleep Study - denies CPAP - denies  Blood Thinner Instructions: n/a Aspirin Instructions: Cont taking, none DOS.   NPO  COVID TEST- 01/21/22, done in PAT  Anesthesia review: Yes, hx of CAD.  Patient denies shortness of breath, fever, cough and chest pain at PAT appointment   All instructions explained to the patient, with a verbal understanding of the material. Patient agrees to go over the instructions while at home for a better understanding. Patient also instructed to self quarantine after being tested for COVID-19. The opportunity to ask questions was provided.

## 2022-01-21 NOTE — Progress Notes (Signed)
  Echocardiogram 2D Echocardiogram has been performed.  Fidel Levy 01/21/2022, 2:04 PM

## 2022-01-22 ENCOUNTER — Other Ambulatory Visit: Payer: Self-pay | Admitting: *Deleted

## 2022-01-22 DIAGNOSIS — I25118 Atherosclerotic heart disease of native coronary artery with other forms of angina pectoris: Secondary | ICD-10-CM

## 2022-01-22 LAB — SARS CORONAVIRUS 2 (TAT 6-24 HRS): SARS Coronavirus 2: NEGATIVE

## 2022-01-22 MED ORDER — DEXMEDETOMIDINE HCL IN NACL 400 MCG/100ML IV SOLN
0.1000 ug/kg/h | INTRAVENOUS | Status: AC
  Administered 2022-01-23: .5 ug/kg/h via INTRAVENOUS
  Filled 2022-01-22: qty 100

## 2022-01-22 MED ORDER — INSULIN REGULAR(HUMAN) IN NACL 100-0.9 UT/100ML-% IV SOLN
INTRAVENOUS | Status: AC
Start: 2022-01-23 — End: 2022-01-23
  Administered 2022-01-23: .9 [IU]/h via INTRAVENOUS
  Filled 2022-01-22: qty 100

## 2022-01-22 MED ORDER — TRANEXAMIC ACID (OHS) PUMP PRIME SOLUTION
2.0000 mg/kg | INTRAVENOUS | Status: DC
  Filled 2022-01-22: qty 1.52

## 2022-01-22 MED ORDER — POTASSIUM CHLORIDE 2 MEQ/ML IV SOLN
80.0000 meq | INTRAVENOUS | Status: DC
  Filled 2022-01-22: qty 40

## 2022-01-22 MED ORDER — VANCOMYCIN HCL 1250 MG/250ML IV SOLN
1250.0000 mg | INTRAVENOUS | Status: AC
Start: 2022-01-23 — End: 2022-01-23
  Administered 2022-01-23: 1250 mg via INTRAVENOUS
  Filled 2022-01-22: qty 250

## 2022-01-22 MED ORDER — NOREPINEPHRINE 4 MG/250ML-% IV SOLN
0.0000 ug/min | INTRAVENOUS | Status: AC
  Administered 2022-01-23: 2 ug/min via INTRAVENOUS
  Filled 2022-01-22: qty 250

## 2022-01-22 MED ORDER — PLASMA-LYTE A IV SOLN
INTRAVENOUS | Status: DC
  Filled 2022-01-22 (×2): qty 2.5

## 2022-01-22 MED ORDER — CEFAZOLIN SODIUM-DEXTROSE 2-4 GM/100ML-% IV SOLN
2.0000 g | INTRAVENOUS | Status: AC
  Administered 2022-01-23 (×2): 2 g via INTRAVENOUS
  Filled 2022-01-22: qty 100

## 2022-01-22 MED ORDER — NITROGLYCERIN IN D5W 200-5 MCG/ML-% IV SOLN
2.0000 ug/min | INTRAVENOUS | Status: AC
  Administered 2022-01-23: 5 ug/min via INTRAVENOUS
  Filled 2022-01-22: qty 250

## 2022-01-22 MED ORDER — EPINEPHRINE HCL 5 MG/250ML IV SOLN IN NS
0.0000 ug/min | INTRAVENOUS | Status: AC
  Administered 2022-01-23: 3 ug/min via INTRAVENOUS
  Filled 2022-01-22: qty 250

## 2022-01-22 MED ORDER — MAGNESIUM SULFATE 50 % IJ SOLN
40.0000 meq | INTRAMUSCULAR | Status: DC
  Filled 2022-01-22: qty 9.85

## 2022-01-22 MED ORDER — TRANEXAMIC ACID (OHS) BOLUS VIA INFUSION
15.0000 mg/kg | INTRAVENOUS | Status: AC
  Administered 2022-01-23: 1141.5 mg via INTRAVENOUS
  Filled 2022-01-22: qty 1142

## 2022-01-22 MED ORDER — HEPARIN 30,000 UNITS/1000 ML (OHS) CELLSAVER SOLUTION
Status: DC
  Filled 2022-01-22: qty 1000

## 2022-01-22 MED ORDER — TRANEXAMIC ACID 1000 MG/10ML IV SOLN
1.5000 mg/kg/h | INTRAVENOUS | Status: AC
  Administered 2022-01-23: 1.5 mg/kg/h via INTRAVENOUS
  Filled 2022-01-22: qty 25

## 2022-01-22 MED ORDER — MILRINONE LACTATE IN DEXTROSE 20-5 MG/100ML-% IV SOLN
0.3000 ug/kg/min | INTRAVENOUS | Status: AC
  Administered 2022-01-23: .3 ug/kg/min via INTRAVENOUS
  Filled 2022-01-22: qty 100

## 2022-01-22 MED ORDER — PHENYLEPHRINE HCL-NACL 20-0.9 MG/250ML-% IV SOLN
30.0000 ug/min | INTRAVENOUS | Status: AC
  Administered 2022-01-23: 50 ug/min via INTRAVENOUS
  Filled 2022-01-22: qty 250

## 2022-01-22 MED ORDER — CEFAZOLIN SODIUM-DEXTROSE 2-4 GM/100ML-% IV SOLN
2.0000 g | INTRAVENOUS | Status: DC
  Filled 2022-01-22: qty 100

## 2022-01-22 NOTE — Anesthesia Preprocedure Evaluation (Signed)
Anesthesia Evaluation  Patient identified by MRN, date of birth, ID band Patient awake    Reviewed: Allergy & Precautions, H&P , NPO status , Patient's Chart, lab work & pertinent test results, reviewed documented beta blocker date and time   Airway Mallampati: I  TM Distance: >3 FB Neck ROM: Full    Dental no notable dental hx. (+) Edentulous Upper, Edentulous Lower, Dental Advisory Given   Pulmonary neg pulmonary ROS,    Pulmonary exam normal breath sounds clear to auscultation       Cardiovascular Exercise Tolerance: Good hypertension, Pt. on medications and Pt. on home beta blockers + CAD and + Past MI  + dysrhythmias  Rhythm:Regular Rate:Normal     Neuro/Psych negative neurological ROS  negative psych ROS   GI/Hepatic Neg liver ROS, GERD  Medicated,  Endo/Other  negative endocrine ROS  Renal/GU negative Renal ROS  negative genitourinary   Musculoskeletal   Abdominal   Peds  Hematology negative hematology ROS (+)   Anesthesia Other Findings   Reproductive/Obstetrics negative OB ROS                            Anesthesia Physical Anesthesia Plan  ASA: 4  Anesthesia Plan: General   Post-op Pain Management: Tylenol PO (pre-op)*   Induction: Intravenous  PONV Risk Score and Plan: 2 and Ondansetron, Dexamethasone and Midazolam  Airway Management Planned: Oral ETT  Additional Equipment: Arterial line, CVP, PA Cath, TEE and Ultrasound Guidance Line Placement  Intra-op Plan:   Post-operative Plan: Post-operative intubation/ventilation  Informed Consent: I have reviewed the patients History and Physical, chart, labs and discussed the procedure including the risks, benefits and alternatives for the proposed anesthesia with the patient or authorized representative who has indicated his/her understanding and acceptance.     Dental advisory given  Plan Discussed with:  CRNA  Anesthesia Plan Comments:        Anesthesia Quick Evaluation

## 2022-01-22 NOTE — Progress Notes (Signed)
Pt made aware of surgery time change. Surgery [phone], arrival 0615.

## 2022-01-23 ENCOUNTER — Inpatient Hospital Stay (HOSPITAL_COMMUNITY)
Admission: RE | Disposition: A | Discharge status: Home / Self Care | Payer: Self-pay | Source: Home / Self Care | Attending: Cardiothoracic Surgery

## 2022-01-23 ENCOUNTER — Inpatient Hospital Stay (HOSPITAL_COMMUNITY): Payer: Medicare Other

## 2022-01-23 ENCOUNTER — Inpatient Hospital Stay (HOSPITAL_COMMUNITY): Payer: Medicare Other | Admitting: Physician Assistant

## 2022-01-23 ENCOUNTER — Encounter (HOSPITAL_COMMUNITY): Payer: Self-pay | Admitting: Cardiothoracic Surgery

## 2022-01-23 ENCOUNTER — Other Ambulatory Visit: Payer: Self-pay

## 2022-01-23 ENCOUNTER — Inpatient Hospital Stay (HOSPITAL_COMMUNITY): Payer: Medicare Other | Admitting: Anesthesiology

## 2022-01-23 ENCOUNTER — Inpatient Hospital Stay (HOSPITAL_COMMUNITY)
Admission: RE | Admit: 2022-01-23 | Discharge: 2022-01-29 | DRG: 236 | Disposition: A | Discharge status: Home Health | Payer: Medicare Other | Attending: Cardiothoracic Surgery | Admitting: Cardiothoracic Surgery

## 2022-01-23 DIAGNOSIS — K219 Gastro-esophageal reflux disease without esophagitis: Secondary | ICD-10-CM | POA: Diagnosis present

## 2022-01-23 DIAGNOSIS — D696 Thrombocytopenia, unspecified: Secondary | ICD-10-CM | POA: Diagnosis not present

## 2022-01-23 DIAGNOSIS — I25118 Atherosclerotic heart disease of native coronary artery with other forms of angina pectoris: Principal | ICD-10-CM | POA: Diagnosis present

## 2022-01-23 DIAGNOSIS — I10 Essential (primary) hypertension: Secondary | ICD-10-CM | POA: Diagnosis not present

## 2022-01-23 DIAGNOSIS — E8770 Fluid overload, unspecified: Secondary | ICD-10-CM | POA: Diagnosis not present

## 2022-01-23 DIAGNOSIS — Z8249 Family history of ischemic heart disease and other diseases of the circulatory system: Secondary | ICD-10-CM | POA: Diagnosis not present

## 2022-01-23 DIAGNOSIS — I252 Old myocardial infarction: Secondary | ICD-10-CM

## 2022-01-23 DIAGNOSIS — I452 Bifascicular block: Secondary | ICD-10-CM | POA: Diagnosis not present

## 2022-01-23 DIAGNOSIS — E785 Hyperlipidemia, unspecified: Secondary | ICD-10-CM | POA: Diagnosis not present

## 2022-01-23 DIAGNOSIS — J811 Chronic pulmonary edema: Secondary | ICD-10-CM | POA: Diagnosis not present

## 2022-01-23 DIAGNOSIS — D62 Acute posthemorrhagic anemia: Secondary | ICD-10-CM | POA: Diagnosis not present

## 2022-01-23 DIAGNOSIS — Z8673 Personal history of transient ischemic attack (TIA), and cerebral infarction without residual deficits: Secondary | ICD-10-CM | POA: Diagnosis not present

## 2022-01-23 DIAGNOSIS — I2510 Atherosclerotic heart disease of native coronary artery without angina pectoris: Secondary | ICD-10-CM

## 2022-01-23 DIAGNOSIS — Z800 Family history of malignant neoplasm of digestive organs: Secondary | ICD-10-CM | POA: Diagnosis not present

## 2022-01-23 DIAGNOSIS — Z951 Presence of aortocoronary bypass graft: Principal | ICD-10-CM

## 2022-01-23 DIAGNOSIS — I255 Ischemic cardiomyopathy: Secondary | ICD-10-CM | POA: Diagnosis present

## 2022-01-23 DIAGNOSIS — Z20822 Contact with and (suspected) exposure to covid-19: Secondary | ICD-10-CM | POA: Diagnosis not present

## 2022-01-23 HISTORY — PX: CORONARY ARTERY BYPASS GRAFT: SHX141

## 2022-01-23 HISTORY — PX: TEE WITHOUT CARDIOVERSION: SHX5443

## 2022-01-23 HISTORY — DX: Presence of aortocoronary bypass graft: Z95.1

## 2022-01-23 HISTORY — PX: RADIAL ARTERY HARVEST: SHX5067

## 2022-01-23 LAB — BASIC METABOLIC PANEL
Anion gap: 6 (ref 5–15)
BUN: 11 mg/dL (ref 8–23)
CO2: 21 mmol/L — ABNORMAL LOW (ref 22–32)
Calcium: 8.1 mg/dL — ABNORMAL LOW (ref 8.9–10.3)
Chloride: 109 mmol/L (ref 98–111)
Creatinine, Ser: 0.81 mg/dL (ref 0.61–1.24)
GFR, Estimated: >60 mL/min (ref >60)
Glucose, Bld: 204 mg/dL — ABNORMAL HIGH (ref 70–99)
Potassium: 3.7 mmol/L (ref 3.5–5.1)
Sodium: 136 mmol/L (ref 135–145)

## 2022-01-23 LAB — POCT I-STAT, CHEM 8
BUN: 17 mg/dL (ref 8–23)
BUN: 14 mg/dL (ref 8–23)
BUN: 12 mg/dL (ref 8–23)
BUN: 11 mg/dL (ref 8–23)
BUN: 11 mg/dL (ref 8–23)
BUN: 12 mg/dL (ref 8–23)
Calcium, Ion: 1.37 mmol/L (ref 1.15–1.40)
Calcium, Ion: 1.32 mmol/L (ref 1.15–1.40)
Calcium, Ion: 1.1 mmol/L — ABNORMAL LOW (ref 1.15–1.40)
Calcium, Ion: 1.14 mmol/L — ABNORMAL LOW (ref 1.15–1.40)
Calcium, Ion: 1.07 mmol/L — ABNORMAL LOW (ref 1.15–1.40)
Calcium, Ion: 0.95 mmol/L — ABNORMAL LOW (ref 1.15–1.40)
Chloride: 105 mmol/L (ref 98–111)
Chloride: 106 mmol/L (ref 98–111)
Chloride: 106 mmol/L (ref 98–111)
Chloride: 102 mmol/L (ref 98–111)
Chloride: 104 mmol/L (ref 98–111)
Chloride: 103 mmol/L (ref 98–111)
Creatinine, Ser: 0.6 mg/dL — ABNORMAL LOW (ref 0.61–1.24)
Creatinine, Ser: 0.5 mg/dL — ABNORMAL LOW (ref 0.61–1.24)
Creatinine, Ser: 0.4 mg/dL — ABNORMAL LOW (ref 0.61–1.24)
Creatinine, Ser: 0.4 mg/dL — ABNORMAL LOW (ref 0.61–1.24)
Creatinine, Ser: 0.4 mg/dL — ABNORMAL LOW (ref 0.61–1.24)
Creatinine, Ser: 0.4 mg/dL — ABNORMAL LOW (ref 0.61–1.24)
Glucose, Bld: 112 mg/dL — ABNORMAL HIGH (ref 70–99)
Glucose, Bld: 114 mg/dL — ABNORMAL HIGH (ref 70–99)
Glucose, Bld: 103 mg/dL — ABNORMAL HIGH (ref 70–99)
Glucose, Bld: 130 mg/dL — ABNORMAL HIGH (ref 70–99)
Glucose, Bld: 140 mg/dL — ABNORMAL HIGH (ref 70–99)
Glucose, Bld: 140 mg/dL — ABNORMAL HIGH (ref 70–99)
HCT: 36 % — ABNORMAL LOW (ref 39.0–52.0)
HCT: 33 % — ABNORMAL LOW (ref 39.0–52.0)
HCT: 24 % — ABNORMAL LOW (ref 39.0–52.0)
HCT: 23 % — ABNORMAL LOW (ref 39.0–52.0)
HCT: 25 % — ABNORMAL LOW (ref 39.0–52.0)
HCT: 23 % — ABNORMAL LOW (ref 39.0–52.0)
Hemoglobin: 12.2 g/dL — ABNORMAL LOW (ref 13.0–17.0)
Hemoglobin: 11.2 g/dL — ABNORMAL LOW (ref 13.0–17.0)
Hemoglobin: 8.2 g/dL — ABNORMAL LOW (ref 13.0–17.0)
Hemoglobin: 7.8 g/dL — ABNORMAL LOW (ref 13.0–17.0)
Hemoglobin: 8.5 g/dL — ABNORMAL LOW (ref 13.0–17.0)
Hemoglobin: 7.8 g/dL — ABNORMAL LOW (ref 13.0–17.0)
Potassium: 4.4 mmol/L (ref 3.5–5.1)
Potassium: 4.2 mmol/L (ref 3.5–5.1)
Potassium: 6.3 mmol/L (ref 3.5–5.1)
Potassium: 4.9 mmol/L (ref 3.5–5.1)
Potassium: 4.9 mmol/L (ref 3.5–5.1)
Potassium: 3.7 mmol/L (ref 3.5–5.1)
Sodium: 141 mmol/L (ref 135–145)
Sodium: 140 mmol/L (ref 135–145)
Sodium: 138 mmol/L (ref 135–145)
Sodium: 136 mmol/L (ref 135–145)
Sodium: 137 mmol/L (ref 135–145)
Sodium: 141 mmol/L (ref 135–145)
TCO2: 29 mmol/L (ref 22–32)
TCO2: 25 mmol/L (ref 22–32)
TCO2: 25 mmol/L (ref 22–32)
TCO2: 24 mmol/L (ref 22–32)
TCO2: 24 mmol/L (ref 22–32)
TCO2: 23 mmol/L (ref 22–32)

## 2022-01-23 LAB — POCT I-STAT 7, (LYTES, BLD GAS, ICA,H+H)
Acid-Base Excess: 5 mmol/L — ABNORMAL HIGH (ref 0.0–2.0)
Acid-Base Excess: 1 mmol/L (ref 0.0–2.0)
Acid-base deficit: 1 mmol/L (ref 0.0–2.0)
Acid-base deficit: 1 mmol/L (ref 0.0–2.0)
Acid-base deficit: 2 mmol/L (ref 0.0–2.0)
Acid-base deficit: 3 mmol/L — ABNORMAL HIGH (ref 0.0–2.0)
Acid-base deficit: 4 mmol/L — ABNORMAL HIGH (ref 0.0–2.0)
Acid-base deficit: 5 mmol/L — ABNORMAL HIGH (ref 0.0–2.0)
Bicarbonate: 30.8 mmol/L — ABNORMAL HIGH (ref 20.0–28.0)
Bicarbonate: 25.8 mmol/L (ref 20.0–28.0)
Bicarbonate: 24 mmol/L (ref 20.0–28.0)
Bicarbonate: 24.3 mmol/L (ref 20.0–28.0)
Bicarbonate: 23.1 mmol/L (ref 20.0–28.0)
Bicarbonate: 23.3 mmol/L (ref 20.0–28.0)
Bicarbonate: 21.9 mmol/L (ref 20.0–28.0)
Bicarbonate: 20.4 mmol/L (ref 20.0–28.0)
Calcium, Ion: 1.41 mmol/L — ABNORMAL HIGH (ref 1.15–1.40)
Calcium, Ion: 1.12 mmol/L — ABNORMAL LOW (ref 1.15–1.40)
Calcium, Ion: 1.13 mmol/L — ABNORMAL LOW (ref 1.15–1.40)
Calcium, Ion: 1.02 mmol/L — ABNORMAL LOW (ref 1.15–1.40)
Calcium, Ion: 0.99 mmol/L — ABNORMAL LOW (ref 1.15–1.40)
Calcium, Ion: 1.23 mmol/L (ref 1.15–1.40)
Calcium, Ion: 1.27 mmol/L (ref 1.15–1.40)
Calcium, Ion: 1.3 mmol/L (ref 1.15–1.40)
HCT: 37 % — ABNORMAL LOW (ref 39.0–52.0)
HCT: 25 % — ABNORMAL LOW (ref 39.0–52.0)
HCT: 22 % — ABNORMAL LOW (ref 39.0–52.0)
HCT: 27 % — ABNORMAL LOW (ref 39.0–52.0)
HCT: 26 % — ABNORMAL LOW (ref 39.0–52.0)
HCT: 26 % — ABNORMAL LOW (ref 39.0–52.0)
HCT: 23 % — ABNORMAL LOW (ref 39.0–52.0)
HCT: 24 % — ABNORMAL LOW (ref 39.0–52.0)
Hemoglobin: 12.6 g/dL — ABNORMAL LOW (ref 13.0–17.0)
Hemoglobin: 8.5 g/dL — ABNORMAL LOW (ref 13.0–17.0)
Hemoglobin: 7.5 g/dL — ABNORMAL LOW (ref 13.0–17.0)
Hemoglobin: 9.2 g/dL — ABNORMAL LOW (ref 13.0–17.0)
Hemoglobin: 8.8 g/dL — ABNORMAL LOW (ref 13.0–17.0)
Hemoglobin: 8.8 g/dL — ABNORMAL LOW (ref 13.0–17.0)
Hemoglobin: 7.8 g/dL — ABNORMAL LOW (ref 13.0–17.0)
Hemoglobin: 8.2 g/dL — ABNORMAL LOW (ref 13.0–17.0)
O2 Saturation: 100 %
O2 Saturation: 100 %
O2 Saturation: 100 %
O2 Saturation: 100 %
O2 Saturation: 99 %
O2 Saturation: 96 %
O2 Saturation: 99 %
O2 Saturation: 96 %
Patient temperature: 36.4
Patient temperature: 97.6
Patient temperature: 98.2
Potassium: 4.4 mmol/L (ref 3.5–5.1)
Potassium: 4.4 mmol/L (ref 3.5–5.1)
Potassium: 4.7 mmol/L (ref 3.5–5.1)
Potassium: 5.4 mmol/L — ABNORMAL HIGH (ref 3.5–5.1)
Potassium: 3.7 mmol/L (ref 3.5–5.1)
Potassium: 3.6 mmol/L (ref 3.5–5.1)
Potassium: 3.7 mmol/L (ref 3.5–5.1)
Potassium: 3.8 mmol/L (ref 3.5–5.1)
Sodium: 140 mmol/L (ref 135–145)
Sodium: 140 mmol/L (ref 135–145)
Sodium: 139 mmol/L (ref 135–145)
Sodium: 137 mmol/L (ref 135–145)
Sodium: 142 mmol/L (ref 135–145)
Sodium: 139 mmol/L (ref 135–145)
Sodium: 139 mmol/L (ref 135–145)
Sodium: 139 mmol/L (ref 135–145)
TCO2: 32 mmol/L (ref 22–32)
TCO2: 27 mmol/L (ref 22–32)
TCO2: 25 mmol/L (ref 22–32)
TCO2: 26 mmol/L (ref 22–32)
TCO2: 24 mmol/L (ref 22–32)
TCO2: 25 mmol/L (ref 22–32)
TCO2: 23 mmol/L (ref 22–32)
TCO2: 22 mmol/L (ref 22–32)
pCO2 arterial: 47.7 mmHg (ref 32–48)
pCO2 arterial: 41 mmHg (ref 32–48)
pCO2 arterial: 38.3 mmHg (ref 32–48)
pCO2 arterial: 42.9 mmHg (ref 32–48)
pCO2 arterial: 40.3 mmHg (ref 32–48)
pCO2 arterial: 43.5 mmHg (ref 32–48)
pCO2 arterial: 41.7 mmHg (ref 32–48)
pCO2 arterial: 39.3 mmHg (ref 32–48)
pH, Arterial: 7.418 (ref 7.35–7.45)
pH, Arterial: 7.407 (ref 7.35–7.45)
pH, Arterial: 7.404 (ref 7.35–7.45)
pH, Arterial: 7.36 (ref 7.35–7.45)
pH, Arterial: 7.366 (ref 7.35–7.45)
pH, Arterial: 7.333 — ABNORMAL LOW (ref 7.35–7.45)
pH, Arterial: 7.327 — ABNORMAL LOW (ref 7.35–7.45)
pH, Arterial: 7.322 — ABNORMAL LOW (ref 7.35–7.45)
pO2, Arterial: 388 mmHg — ABNORMAL HIGH (ref 83–108)
pO2, Arterial: 376 mmHg — ABNORMAL HIGH (ref 83–108)
pO2, Arterial: 261 mmHg — ABNORMAL HIGH (ref 83–108)
pO2, Arterial: 303 mmHg — ABNORMAL HIGH (ref 83–108)
pO2, Arterial: 125 mmHg — ABNORMAL HIGH (ref 83–108)
pO2, Arterial: 83 mmHg (ref 83–108)
pO2, Arterial: 126 mmHg — ABNORMAL HIGH (ref 83–108)
pO2, Arterial: 89 mmHg (ref 83–108)

## 2022-01-23 LAB — CBC
HCT: 29 % — ABNORMAL LOW (ref 39.0–52.0)
HCT: 25.1 % — ABNORMAL LOW (ref 39.0–52.0)
Hemoglobin: 10.2 g/dL — ABNORMAL LOW (ref 13.0–17.0)
Hemoglobin: 8.7 g/dL — ABNORMAL LOW (ref 13.0–17.0)
MCH: 33.3 pg (ref 26.0–34.0)
MCH: 33.2 pg (ref 26.0–34.0)
MCHC: 35.2 g/dL (ref 30.0–36.0)
MCHC: 34.7 g/dL (ref 30.0–36.0)
MCV: 94.8 fL (ref 80.0–100.0)
MCV: 95.8 fL (ref 80.0–100.0)
Platelets: 112 10*3/uL — ABNORMAL LOW (ref 150–400)
Platelets: 101 10*3/uL — ABNORMAL LOW (ref 150–400)
RBC: 3.06 MIL/uL — ABNORMAL LOW (ref 4.22–5.81)
RBC: 2.62 MIL/uL — ABNORMAL LOW (ref 4.22–5.81)
RDW: 13.6 % (ref 11.5–15.5)
RDW: 13.6 % (ref 11.5–15.5)
WBC: 19.7 10*3/uL — ABNORMAL HIGH (ref 4.0–10.5)
WBC: 16.3 10*3/uL — ABNORMAL HIGH (ref 4.0–10.5)
nRBC: 0 % (ref 0.0–0.2)
nRBC: 0 % (ref 0.0–0.2)

## 2022-01-23 LAB — POCT I-STAT EG7
Acid-Base Excess: 0 mmol/L (ref 0.0–2.0)
Bicarbonate: 25 mmol/L (ref 20.0–28.0)
Calcium, Ion: 1.19 mmol/L (ref 1.15–1.40)
HCT: 26 % — ABNORMAL LOW (ref 39.0–52.0)
Hemoglobin: 8.8 g/dL — ABNORMAL LOW (ref 13.0–17.0)
O2 Saturation: 86 %
Potassium: 4.1 mmol/L (ref 3.5–5.1)
Sodium: 141 mmol/L (ref 135–145)
TCO2: 26 mmol/L (ref 22–32)
pCO2, Ven: 42.3 mmHg — ABNORMAL LOW (ref 44–60)
pH, Ven: 7.38 (ref 7.25–7.43)
pO2, Ven: 52 mmHg — ABNORMAL HIGH (ref 32–45)

## 2022-01-23 LAB — GLUCOSE, CAPILLARY
Glucose-Capillary: 117 mg/dL — ABNORMAL HIGH (ref 70–99)
Glucose-Capillary: 160 mg/dL — ABNORMAL HIGH (ref 70–99)
Glucose-Capillary: 182 mg/dL — ABNORMAL HIGH (ref 70–99)
Glucose-Capillary: 180 mg/dL — ABNORMAL HIGH (ref 70–99)
Glucose-Capillary: 180 mg/dL — ABNORMAL HIGH (ref 70–99)
Glucose-Capillary: 191 mg/dL — ABNORMAL HIGH (ref 70–99)
Glucose-Capillary: 190 mg/dL — ABNORMAL HIGH (ref 70–99)
Glucose-Capillary: 182 mg/dL — ABNORMAL HIGH (ref 70–99)

## 2022-01-23 LAB — HEMOGLOBIN AND HEMATOCRIT, BLOOD
HCT: 24.8 % — ABNORMAL LOW (ref 39.0–52.0)
Hemoglobin: 8.6 g/dL — ABNORMAL LOW (ref 13.0–17.0)

## 2022-01-23 LAB — PLATELET COUNT: Platelets: 124 10*3/uL — ABNORMAL LOW (ref 150–400)

## 2022-01-23 LAB — APTT: aPTT: 32 seconds (ref 24–36)

## 2022-01-23 LAB — MAGNESIUM: Magnesium: 2.6 mg/dL — ABNORMAL HIGH (ref 1.7–2.4)

## 2022-01-23 LAB — ABO/RH: ABO/RH(D): O POS

## 2022-01-23 LAB — PROTIME-INR
INR: 1.5 — ABNORMAL HIGH (ref 0.8–1.2)
Prothrombin Time: 17.5 seconds — ABNORMAL HIGH (ref 11.4–15.2)

## 2022-01-23 LAB — FIBRINOGEN: Fibrinogen: 148 mg/dL — ABNORMAL LOW (ref 210–475)

## 2022-01-23 LAB — ECHO INTRAOPERATIVE TEE
Height: 69 in
S' Lateral: 5.1 cm
Weight: 2688 oz

## 2022-01-23 LAB — PREPARE RBC (CROSSMATCH)

## 2022-01-23 SURGERY — CORONARY ARTERY BYPASS GRAFTING (CABG)
Anesthesia: General | Site: Chest

## 2022-01-23 MED ORDER — NITROGLYCERIN IN D5W 200-5 MCG/ML-% IV SOLN: 7.0000 ug/min | INTRAVENOUS | Status: DC

## 2022-01-23 MED ORDER — ORAL CARE MOUTH RINSE: 15.0000 mL | OROMUCOSAL | Status: DC

## 2022-01-23 MED ORDER — SODIUM CHLORIDE 0.9 % IV SOLN: 250.0000 mL | INTRAVENOUS | Status: DC

## 2022-01-23 MED ORDER — ACETAMINOPHEN 650 MG RE SUPP
650.0000 mg | Freq: Once | RECTAL | Status: AC
  Administered 2022-01-23: 650 mg via RECTAL

## 2022-01-23 MED ORDER — ACETAMINOPHEN 160 MG/5ML PO SOLN: 1000.0000 mg | Freq: Four times a day (QID) | ORAL | Status: AC

## 2022-01-23 MED ORDER — CHLORHEXIDINE GLUCONATE 4 % EX LIQD: 30.0000 mL | CUTANEOUS | Status: DC

## 2022-01-23 MED ORDER — SODIUM CHLORIDE 0.9% FLUSH: 10.0000 mL | INTRAVENOUS | Status: DC | PRN

## 2022-01-23 MED ORDER — FENTANYL CITRATE (PF) 250 MCG/5ML IJ SOLN
INTRAMUSCULAR | Status: AC
  Filled 2022-01-23: qty 5

## 2022-01-23 MED ORDER — METOPROLOL TARTRATE 5 MG/5ML IV SOLN: 2.5000 mg | INTRAVENOUS | Status: DC | PRN

## 2022-01-23 MED ORDER — SODIUM CHLORIDE 0.9 % IV SOLN: INTRAVENOUS | Status: DC | PRN

## 2022-01-23 MED ORDER — LACTATED RINGERS IV SOLN: 500.0000 mL | Freq: Once | INTRAVENOUS | Status: DC | PRN

## 2022-01-23 MED ORDER — POTASSIUM CHLORIDE 10 MEQ/50ML IV SOLN
10.0000 meq | INTRAVENOUS | Status: AC
  Administered 2022-01-23 – 2022-01-24 (×3): 10 meq via INTRAVENOUS
  Filled 2022-01-23 (×3): qty 50

## 2022-01-23 MED ORDER — MILRINONE LACTATE IN DEXTROSE 20-5 MG/100ML-% IV SOLN
0.3000 ug/kg/min | INTRAVENOUS | Status: DC
  Administered 2022-01-24: 0.3 ug/kg/min via INTRAVENOUS
  Filled 2022-01-23: qty 100

## 2022-01-23 MED ORDER — NOREPINEPHRINE 4 MG/250ML-% IV SOLN
0.0000 ug/min | INTRAVENOUS | Status: DC
  Administered 2022-01-24: 6 ug/min via INTRAVENOUS
  Filled 2022-01-23: qty 250

## 2022-01-23 MED ORDER — SODIUM CHLORIDE 0.9% FLUSH: 3.0000 mL | INTRAVENOUS | Status: DC | PRN

## 2022-01-23 MED ORDER — ROCURONIUM BROMIDE 10 MG/ML (PF) SYRINGE
PREFILLED_SYRINGE | INTRAVENOUS | Status: AC
  Filled 2022-01-23: qty 30

## 2022-01-23 MED ORDER — LACTATED RINGERS IV SOLN: INTRAVENOUS | Status: DC | PRN

## 2022-01-23 MED ORDER — BISACODYL 10 MG RE SUPP
10.0000 mg | Freq: Every day | RECTAL | Status: DC
  Filled 2022-01-23: qty 1

## 2022-01-23 MED ORDER — ROCURONIUM BROMIDE 100 MG/10ML IV SOLN
INTRAVENOUS | Status: DC | PRN
  Administered 2022-01-23: 20 mg via INTRAVENOUS
  Administered 2022-01-23: 100 mg via INTRAVENOUS
  Administered 2022-01-23: 50 mg via INTRAVENOUS
  Administered 2022-01-23: 30 mg via INTRAVENOUS
  Administered 2022-01-23: 50 mg via INTRAVENOUS

## 2022-01-23 MED ORDER — MAGNESIUM SULFATE 4 GM/100ML IV SOLN
4.0000 g | Freq: Once | INTRAVENOUS | Status: AC
  Administered 2022-01-23: 4 g via INTRAVENOUS
  Filled 2022-01-23: qty 100

## 2022-01-23 MED ORDER — PROTAMINE SULFATE 10 MG/ML IV SOLN
INTRAVENOUS | Status: DC | PRN
  Administered 2022-01-23: 20 mg via INTRAVENOUS
  Administered 2022-01-23: 10 mg via INTRAVENOUS
  Administered 2022-01-23: 220 mg via INTRAVENOUS

## 2022-01-23 MED ORDER — ALBUMIN HUMAN 5 % IV SOLN: INTRAVENOUS | Status: DC | PRN

## 2022-01-23 MED ORDER — SODIUM CHLORIDE 0.9 % IV SOLN
20.0000 ug | Freq: Once | INTRAVENOUS | Status: AC
  Administered 2022-01-23: 20 ug via INTRAVENOUS
  Filled 2022-01-23: qty 5

## 2022-01-23 MED ORDER — OXYCODONE HCL 5 MG PO TABS
5.0000 mg | ORAL_TABLET | ORAL | Status: DC | PRN
  Administered 2022-01-24 (×2): 10 mg via ORAL
  Administered 2022-01-24: 5 mg via ORAL
  Administered 2022-01-25: 10 mg via ORAL
  Filled 2022-01-23: qty 2
  Filled 2022-01-23: qty 1
  Filled 2022-01-23 (×2): qty 2

## 2022-01-23 MED ORDER — SODIUM CHLORIDE 0.9 % IV SOLN: INTRAVENOUS | Status: DC

## 2022-01-23 MED ORDER — DEXTROSE 50 % IV SOLN: 0.0000 mL | INTRAVENOUS | Status: DC | PRN

## 2022-01-23 MED ORDER — ROSUVASTATIN CALCIUM 5 MG PO TABS
10.0000 mg | ORAL_TABLET | Freq: Every day | ORAL | Status: DC
  Administered 2022-01-24 – 2022-01-29 (×6): 10 mg via ORAL
  Filled 2022-01-23 (×6): qty 2

## 2022-01-23 MED ORDER — OCUVITE-LUTEIN PO CAPS: 1.0000 | ORAL_CAPSULE | Freq: Two times a day (BID) | ORAL | Status: DC

## 2022-01-23 MED ORDER — VANCOMYCIN HCL IN DEXTROSE 1-5 GM/200ML-% IV SOLN: 1000.0000 mg | Freq: Once | INTRAVENOUS | Status: DC

## 2022-01-23 MED ORDER — POTASSIUM CHLORIDE 10 MEQ/50ML IV SOLN
10.0000 meq | INTRAVENOUS | Status: AC
  Administered 2022-01-23 (×3): 10 meq via INTRAVENOUS

## 2022-01-23 MED ORDER — VANCOMYCIN HCL IN DEXTROSE 1-5 GM/200ML-% IV SOLN
1000.0000 mg | Freq: Two times a day (BID) | INTRAVENOUS | Status: AC
  Administered 2022-01-23 – 2022-01-24 (×2): 1000 mg via INTRAVENOUS
  Filled 2022-01-23 (×2): qty 200

## 2022-01-23 MED ORDER — ASPIRIN 325 MG PO TBEC
325.0000 mg | DELAYED_RELEASE_TABLET | Freq: Every day | ORAL | Status: DC
  Administered 2022-01-24: 325 mg via ORAL
  Filled 2022-01-23: qty 1

## 2022-01-23 MED ORDER — SODIUM CHLORIDE (PF) 0.9 % IJ SOLN
OROMUCOSAL | Status: DC | PRN
  Administered 2022-01-23: 12 mL via TOPICAL

## 2022-01-23 MED ORDER — EPINEPHRINE HCL 5 MG/250ML IV SOLN IN NS
0.0000 ug/min | INTRAVENOUS | Status: DC
  Administered 2022-01-24: 2 ug/min via INTRAVENOUS
  Filled 2022-01-23: qty 250

## 2022-01-23 MED ORDER — METOPROLOL TARTRATE 12.5 MG HALF TABLET: 12.5000 mg | ORAL_TABLET | Freq: Two times a day (BID) | ORAL | Status: DC

## 2022-01-23 MED ORDER — PHENYLEPHRINE HCL-NACL 20-0.9 MG/250ML-% IV SOLN: 0.0000 ug/min | INTRAVENOUS | Status: DC

## 2022-01-23 MED ORDER — SODIUM CHLORIDE 0.9% FLUSH
10.0000 mL | Freq: Two times a day (BID) | INTRAVENOUS | Status: DC
  Administered 2022-01-23 – 2022-01-27 (×5): 10 mL

## 2022-01-23 MED ORDER — TRAMADOL HCL 50 MG PO TABS
50.0000 mg | ORAL_TABLET | ORAL | Status: DC | PRN
  Administered 2022-01-24 (×2): 50 mg via ORAL
  Filled 2022-01-23 (×2): qty 1

## 2022-01-23 MED ORDER — METOPROLOL TARTRATE 25 MG/10 ML ORAL SUSPENSION: 12.5000 mg | Freq: Two times a day (BID) | ORAL | Status: DC

## 2022-01-23 MED ORDER — ONDANSETRON HCL 4 MG/2ML IJ SOLN
4.0000 mg | Freq: Four times a day (QID) | INTRAMUSCULAR | Status: DC | PRN
  Administered 2022-01-24 – 2022-01-25 (×3): 4 mg via INTRAVENOUS
  Filled 2022-01-23 (×3): qty 2

## 2022-01-23 MED ORDER — MIDAZOLAM HCL 2 MG/2ML IJ SOLN
2.0000 mg | INTRAMUSCULAR | Status: DC | PRN
  Administered 2022-01-23 (×2): 2 mg via INTRAVENOUS
  Filled 2022-01-23 (×2): qty 2

## 2022-01-23 MED ORDER — FAMOTIDINE IN NACL 20-0.9 MG/50ML-% IV SOLN
20.0000 mg | Freq: Two times a day (BID) | INTRAVENOUS | Status: AC
  Administered 2022-01-23: 20 mg via INTRAVENOUS
  Filled 2022-01-23: qty 50

## 2022-01-23 MED ORDER — SODIUM CHLORIDE 0.9% FLUSH
3.0000 mL | Freq: Two times a day (BID) | INTRAVENOUS | Status: DC
  Administered 2022-01-24 – 2022-01-27 (×6): 3 mL via INTRAVENOUS

## 2022-01-23 MED ORDER — MIDAZOLAM HCL 5 MG/5ML IJ SOLN
INTRAMUSCULAR | Status: DC | PRN
  Administered 2022-01-23 (×2): 1 mg via INTRAVENOUS
  Administered 2022-01-23: 2 mg via INTRAVENOUS
  Administered 2022-01-23: 1 mg via INTRAVENOUS
  Administered 2022-01-23: 2 mg via INTRAVENOUS
  Administered 2022-01-23: 1 mg via INTRAVENOUS
  Administered 2022-01-23: 2 mg via INTRAVENOUS

## 2022-01-23 MED ORDER — PROPOFOL 10 MG/ML IV BOLUS
INTRAVENOUS | Status: AC
  Filled 2022-01-23: qty 20

## 2022-01-23 MED ORDER — MIDAZOLAM HCL (PF) 10 MG/2ML IJ SOLN
INTRAMUSCULAR | Status: AC
  Filled 2022-01-23: qty 2

## 2022-01-23 MED ORDER — HEMOSTATIC AGENTS (NO CHARGE) OPTIME
TOPICAL | Status: DC | PRN
  Administered 2022-01-23 (×3): 1 via TOPICAL

## 2022-01-23 MED ORDER — BISACODYL 5 MG PO TBEC
10.0000 mg | DELAYED_RELEASE_TABLET | Freq: Every day | ORAL | Status: DC
  Administered 2022-01-24 – 2022-01-27 (×4): 10 mg via ORAL
  Filled 2022-01-23 (×5): qty 2

## 2022-01-23 MED ORDER — FENTANYL CITRATE (PF) 250 MCG/5ML IJ SOLN
INTRAMUSCULAR | Status: DC | PRN
  Administered 2022-01-23: 25 ug via INTRAVENOUS
  Administered 2022-01-23 (×2): 150 ug via INTRAVENOUS
  Administered 2022-01-23: 125 ug via INTRAVENOUS
  Administered 2022-01-23 (×2): 100 ug via INTRAVENOUS

## 2022-01-23 MED ORDER — CHLORHEXIDINE GLUCONATE 0.12 % MT SOLN
15.0000 mL | OROMUCOSAL | Status: AC
  Administered 2022-01-23: 15 mL via OROMUCOSAL

## 2022-01-23 MED ORDER — PLASMA-LYTE A IV SOLN: INTRAVENOUS | Status: DC | PRN

## 2022-01-23 MED ORDER — CHLORHEXIDINE GLUCONATE 0.12 % MT SOLN
15.0000 mL | Freq: Once | OROMUCOSAL | Status: AC
  Administered 2022-01-23: 15 mL via OROMUCOSAL
  Filled 2022-01-23: qty 15

## 2022-01-23 MED ORDER — LACTATED RINGERS IV SOLN: INTRAVENOUS | Status: DC

## 2022-01-23 MED ORDER — ACETAMINOPHEN 160 MG/5ML PO SOLN: 650.0000 mg | Freq: Once | ORAL | Status: AC

## 2022-01-23 MED ORDER — ETOMIDATE 2 MG/ML IV SOLN
INTRAVENOUS | Status: DC | PRN
  Administered 2022-01-23: 10 mg via INTRAVENOUS

## 2022-01-23 MED ORDER — METOPROLOL TARTRATE 12.5 MG HALF TABLET
12.5000 mg | ORAL_TABLET | Freq: Once | ORAL | Status: DC
  Filled 2022-01-23: qty 1

## 2022-01-23 MED ORDER — DOCUSATE SODIUM 100 MG PO CAPS
200.0000 mg | ORAL_CAPSULE | Freq: Every day | ORAL | Status: DC
  Administered 2022-01-24 – 2022-01-28 (×5): 200 mg via ORAL
  Filled 2022-01-23 (×5): qty 2

## 2022-01-23 MED ORDER — ORAL CARE MOUTH RINSE
15.0000 mL | Freq: Two times a day (BID) | OROMUCOSAL | Status: DC
  Administered 2022-01-23 – 2022-01-28 (×9): 15 mL via OROMUCOSAL

## 2022-01-23 MED ORDER — 0.9 % SODIUM CHLORIDE (POUR BTL) OPTIME
TOPICAL | Status: DC | PRN
  Administered 2022-01-23: 5000 mL

## 2022-01-23 MED ORDER — PANTOPRAZOLE SODIUM 40 MG PO TBEC
40.0000 mg | DELAYED_RELEASE_TABLET | Freq: Every day | ORAL | Status: DC
  Administered 2022-01-25 – 2022-01-29 (×5): 40 mg via ORAL
  Filled 2022-01-23 (×5): qty 1

## 2022-01-23 MED ORDER — CEFAZOLIN SODIUM-DEXTROSE 2-4 GM/100ML-% IV SOLN
2.0000 g | Freq: Three times a day (TID) | INTRAVENOUS | Status: AC
  Administered 2022-01-23 – 2022-01-25 (×6): 2 g via INTRAVENOUS
  Filled 2022-01-23 (×6): qty 100

## 2022-01-23 MED ORDER — HEPARIN SODIUM (PORCINE) 1000 UNIT/ML IJ SOLN
INTRAMUSCULAR | Status: DC | PRN
  Administered 2022-01-23: 19000 [IU] via INTRAVENOUS
  Administered 2022-01-23 (×2): 2000 [IU] via INTRAVENOUS

## 2022-01-23 MED ORDER — ACETAMINOPHEN 500 MG PO TABS
1000.0000 mg | ORAL_TABLET | Freq: Four times a day (QID) | ORAL | Status: AC
  Administered 2022-01-24 – 2022-01-28 (×16): 1000 mg via ORAL
  Filled 2022-01-23 (×15): qty 2

## 2022-01-23 MED ORDER — CHLORHEXIDINE GLUCONATE 0.12% ORAL RINSE (MEDLINE KIT)
15.0000 mL | Freq: Two times a day (BID) | OROMUCOSAL | Status: DC
  Administered 2022-01-23: 15 mL via OROMUCOSAL

## 2022-01-23 MED ORDER — SODIUM CHLORIDE 0.45 % IV SOLN: INTRAVENOUS | Status: DC | PRN

## 2022-01-23 MED ORDER — INSULIN REGULAR(HUMAN) IN NACL 100-0.9 UT/100ML-% IV SOLN
INTRAVENOUS | Status: DC
  Administered 2022-01-23: 1.8 [IU]/h via INTRAVENOUS

## 2022-01-23 MED ORDER — ~~LOC~~ CARDIAC SURGERY, PATIENT & FAMILY EDUCATION
Freq: Once | Status: DC
  Filled 2022-01-23: qty 1

## 2022-01-23 MED ORDER — ASPIRIN 81 MG PO CHEW: 324.0000 mg | CHEWABLE_TABLET | Freq: Every day | ORAL | Status: DC

## 2022-01-23 MED ORDER — MORPHINE SULFATE (PF) 2 MG/ML IV SOLN
1.0000 mg | INTRAVENOUS | Status: DC | PRN
  Administered 2022-01-23: 4 mg via INTRAVENOUS
  Administered 2022-01-23 – 2022-01-24 (×2): 1 mg via INTRAVENOUS
  Filled 2022-01-23: qty 1
  Filled 2022-01-23: qty 2
  Filled 2022-01-23: qty 1

## 2022-01-23 MED ORDER — DEXMEDETOMIDINE HCL IN NACL 400 MCG/100ML IV SOLN
0.0000 ug/kg/h | INTRAVENOUS | Status: DC
  Administered 2022-01-23: 0.7 ug/kg/h via INTRAVENOUS
  Filled 2022-01-23: qty 100

## 2022-01-23 MED ORDER — CHLORHEXIDINE GLUCONATE CLOTH 2 % EX PADS
6.0000 | MEDICATED_PAD | Freq: Every day | CUTANEOUS | Status: DC
  Administered 2022-01-23 – 2022-01-27 (×5): 6 via TOPICAL

## 2022-01-23 MED ORDER — ALBUMIN HUMAN 5 % IV SOLN
250.0000 mL | INTRAVENOUS | Status: DC | PRN
  Administered 2022-01-23 (×3): 12.5 g via INTRAVENOUS
  Filled 2022-01-23 (×2): qty 250

## 2022-01-23 SURGICAL SUPPLY — 125 items
ADAPTER CARDIO PERF ANTE/RETRO (ADAPTER) ×4 IMPLANT
APPLIER CLIP 9.375 SM OPEN (CLIP) ×4
BAG DECANTER FOR FLEXI CONT (MISCELLANEOUS) ×4 IMPLANT
BLADE CLIPPER SURG (BLADE) ×4 IMPLANT
BLADE NDL 3 SS STRL (BLADE) IMPLANT
BLADE NEEDLE 3 SS STRL (BLADE) ×4 IMPLANT
BLADE STERNUM SYSTEM 6 (BLADE) ×4 IMPLANT
BLADE SURG 12 STRL SS (BLADE) ×4 IMPLANT
BLADE SURG 15 STRL LF DISP TIS (BLADE) ×3 IMPLANT
BLADE SURG 15 STRL SS (BLADE) ×1
BNDG ELASTIC 4X5.8 VLCR STR LF (GAUZE/BANDAGES/DRESSINGS) ×5 IMPLANT
BNDG ELASTIC 6X5.8 VLCR STR LF (GAUZE/BANDAGES/DRESSINGS) ×4 IMPLANT
BNDG GAUZE ELAST 4 BULKY (GAUZE/BANDAGES/DRESSINGS) ×5 IMPLANT
CANISTER SUCT 3000ML PPV (MISCELLANEOUS) ×4 IMPLANT
CANNULA GUNDRY RCSP 15FR (MISCELLANEOUS) ×4 IMPLANT
CATH CPB KIT VANTRIGT (MISCELLANEOUS) ×4 IMPLANT
CATH ROBINSON RED A/P 18FR (CATHETERS) ×12 IMPLANT
CATH THORACIC 28FR (CATHETERS) ×1 IMPLANT
CATH THORACIC 28FR RT ANG (CATHETERS) ×4 IMPLANT
CLIP APPLIE 9.375 SM OPEN (CLIP) ×3 IMPLANT
CLIP FOGARTY SPRING 6M (CLIP) ×1 IMPLANT
CLIP TI WIDE RED SMALL 24 (CLIP) ×3 IMPLANT
CLIP VESOCCLUDE MED 24/CT (CLIP) IMPLANT
CLIP VESOCCLUDE SM WIDE 24/CT (CLIP) IMPLANT
CONTAINER PROTECT SURGISLUSH (MISCELLANEOUS) ×8 IMPLANT
COVER MAYO STAND STRL (DRAPES) ×4 IMPLANT
COVER PROBE W GEL 5X96 (DRAPES) ×1 IMPLANT
CUFF TOURN SGL QUICK 18X4 (TOURNIQUET CUFF) IMPLANT
CUFF TOURN SGL QUICK 24 (TOURNIQUET CUFF)
CUFF TRNQT CYL 24X4X16.5-23 (TOURNIQUET CUFF) IMPLANT
DERMABOND ADVANCED (GAUZE/BANDAGES/DRESSINGS) ×1
DERMABOND ADVANCED .7 DNX12 (GAUZE/BANDAGES/DRESSINGS) IMPLANT
DRAIN CHANNEL 32F RND 10.7 FF (WOUND CARE) ×4 IMPLANT
DRAPE CARDIOVASCULAR INCISE (DRAPES) ×1
DRAPE EXTREMITY T 121X128X90 (DISPOSABLE) ×4 IMPLANT
DRAPE HALF SHEET 40X57 (DRAPES) ×5 IMPLANT
DRAPE SLUSH/WARMER DISC (DRAPES) ×3 IMPLANT
DRAPE SRG 135X102X78XABS (DRAPES) ×3 IMPLANT
DRSG AQUACEL AG ADV 3.5X14 (GAUZE/BANDAGES/DRESSINGS) ×4 IMPLANT
DRSG COVADERM 4X14 (GAUZE/BANDAGES/DRESSINGS) ×1 IMPLANT
ELECT BLADE 4.0 EZ CLEAN MEGAD (MISCELLANEOUS) ×4
ELECT BLADE 6.5 EXT (BLADE) ×4 IMPLANT
ELECT CAUTERY BLADE 6.4 (BLADE) ×4 IMPLANT
ELECT REM PT RETURN 9FT ADLT (ELECTROSURGICAL) ×8
ELECTRODE BLDE 4.0 EZ CLN MEGD (MISCELLANEOUS) ×3 IMPLANT
ELECTRODE REM PT RTRN 9FT ADLT (ELECTROSURGICAL) ×6 IMPLANT
FELT TEFLON 1X6 (MISCELLANEOUS) ×7 IMPLANT
GAUZE 4X4 16PLY ~~LOC~~+RFID DBL (SPONGE) ×4 IMPLANT
GAUZE SPONGE 4X4 12PLY STRL (GAUZE/BANDAGES/DRESSINGS) ×9 IMPLANT
GEL ULTRASOUND 20GR AQUASONIC (MISCELLANEOUS) ×4 IMPLANT
GLOVE BIO SURGEON STRL SZ 6 (GLOVE) ×1 IMPLANT
GLOVE BIO SURGEON STRL SZ 6.5 (GLOVE) ×2 IMPLANT
GLOVE BIO SURGEON STRL SZ7.5 (GLOVE) ×12 IMPLANT
GLOVE BIO SURGEON STRL SZ8 (GLOVE) ×1 IMPLANT
GLOVE SKINSENSE NS SZ6.5 (GLOVE) ×1
GLOVE SKINSENSE STRL SZ6.5 (GLOVE) IMPLANT
GLOVE SS N UNI LF 6.5 STRL (GLOVE) ×1 IMPLANT
GOWN STRL REUS W/ TWL LRG LVL3 (GOWN DISPOSABLE) ×12 IMPLANT
GOWN STRL REUS W/TWL LRG LVL3 (GOWN DISPOSABLE) ×6
HEMOSTAT POWDER SURGIFOAM 1G (HEMOSTASIS) ×12 IMPLANT
HEMOSTAT SURGICEL 2X14 (HEMOSTASIS) ×3 IMPLANT
INSERT FOGARTY XLG (MISCELLANEOUS) IMPLANT
KIT BASIN OR (CUSTOM PROCEDURE TRAY) ×4 IMPLANT
KIT SUCTION CATH 14FR (SUCTIONS) ×4 IMPLANT
KIT TURNOVER KIT B (KITS) ×4 IMPLANT
KIT VASOVIEW HEMOPRO 2 VH 4000 (KITS) ×4 IMPLANT
LEAD PACING MYOCARDI (MISCELLANEOUS) ×4 IMPLANT
MARKER GRAFT CORONARY BYPASS (MISCELLANEOUS) ×12 IMPLANT
NS IRRIG 1000ML POUR BTL (IV SOLUTION) ×20 IMPLANT
PACK E OPEN HEART (SUTURE) ×4 IMPLANT
PACK OPEN HEART (CUSTOM PROCEDURE TRAY) ×4 IMPLANT
PAD ARMBOARD 7.5X6 YLW CONV (MISCELLANEOUS) ×8 IMPLANT
PAD ELECT DEFIB RADIOL ZOLL (MISCELLANEOUS) ×4 IMPLANT
PENCIL BUTTON HOLSTER BLD 10FT (ELECTRODE) ×4 IMPLANT
POSITIONER HEAD DONUT 9IN (MISCELLANEOUS) ×4 IMPLANT
POWDER SURGICEL 3.0 GRAM (HEMOSTASIS) ×4 IMPLANT
PUNCH AORTIC ROTATE 4.0MM (MISCELLANEOUS) IMPLANT
PUNCH AORTIC ROTATE 4.5MM 8IN (MISCELLANEOUS) ×1 IMPLANT
PUNCH AORTIC ROTATE 5MM 8IN (MISCELLANEOUS) IMPLANT
SET MPS 3-ND DEL (MISCELLANEOUS) ×1 IMPLANT
SHEARS HARMONIC 9CM CVD (BLADE) ×4 IMPLANT
SPONGE T-LAP 18X18 ~~LOC~~+RFID (SPONGE) ×18 IMPLANT
SPONGE T-LAP 4X18 ~~LOC~~+RFID (SPONGE) ×4 IMPLANT
STAPLER VISISTAT 35W (STAPLE) ×1 IMPLANT
SUPPORT HEART JANKE-BARRON (MISCELLANEOUS) ×4 IMPLANT
SURGIFLO W/THROMBIN 8M KIT (HEMOSTASIS) ×4 IMPLANT
SUT BONE WAX W31G (SUTURE) ×4 IMPLANT
SUT MNCRL AB 4-0 PS2 18 (SUTURE) ×3 IMPLANT
SUT PROLENE 3 0 SH DA (SUTURE) ×1 IMPLANT
SUT PROLENE 3 0 SH1 36 (SUTURE) IMPLANT
SUT PROLENE 4 0 RB 1 (SUTURE) ×2
SUT PROLENE 4 0 SH DA (SUTURE) ×5 IMPLANT
SUT PROLENE 4-0 RB1 .5 CRCL 36 (SUTURE) ×3 IMPLANT
SUT PROLENE 5 0 C 1 36 (SUTURE) ×1 IMPLANT
SUT PROLENE 6 0 C 1 24 (SUTURE) ×1 IMPLANT
SUT PROLENE 6 0 C 1 30 (SUTURE) ×4 IMPLANT
SUT PROLENE 6 0 CC (SUTURE) ×14 IMPLANT
SUT PROLENE 8 0 BV175 6 (SUTURE) ×1 IMPLANT
SUT PROLENE BLUE 7 0 (SUTURE) ×5 IMPLANT
SUT PROLENE POLY MONO (SUTURE) ×6 IMPLANT
SUT SILK  1 MH (SUTURE)
SUT SILK 1 MH (SUTURE) IMPLANT
SUT SILK 2 0 SH CR/8 (SUTURE) ×1 IMPLANT
SUT SILK 3 0 SH CR/8 (SUTURE) IMPLANT
SUT STEEL 6MS V (SUTURE) ×7 IMPLANT
SUT STEEL SZ 6 DBL 3X14 BALL (SUTURE) ×4 IMPLANT
SUT VIC AB 1 CTX 36 (SUTURE) ×3
SUT VIC AB 1 CTX36XBRD ANBCTR (SUTURE) ×6 IMPLANT
SUT VIC AB 2-0 CT1 27 (SUTURE) ×3
SUT VIC AB 2-0 CT1 TAPERPNT 27 (SUTURE) IMPLANT
SUT VIC AB 2-0 CTX 27 (SUTURE) IMPLANT
SUT VIC AB 3-0 SH 27 (SUTURE)
SUT VIC AB 3-0 SH 27X BRD (SUTURE) IMPLANT
SUT VIC AB 3-0 X1 27 (SUTURE) IMPLANT
SYR 50ML SLIP (SYRINGE) IMPLANT
SYSTEM SAHARA CHEST DRAIN ATS (WOUND CARE) ×4 IMPLANT
TAPE CLOTH SURG 4X10 WHT LF (GAUZE/BANDAGES/DRESSINGS) ×1 IMPLANT
TAPE PAPER 2X10 WHT MICROPORE (GAUZE/BANDAGES/DRESSINGS) ×1 IMPLANT
TOWEL GREEN STERILE (TOWEL DISPOSABLE) ×4 IMPLANT
TOWEL GREEN STERILE FF (TOWEL DISPOSABLE) ×4 IMPLANT
TRAY FOLEY SLVR 16FR TEMP STAT (SET/KITS/TRAYS/PACK) ×4 IMPLANT
TUBE SUCTION CARDIAC 10FR (CANNULA) ×1 IMPLANT
TUBING LAP HI FLOW INSUFFLATIO (TUBING) ×4 IMPLANT
UNDERPAD 30X36 HEAVY ABSORB (UNDERPADS AND DIAPERS) ×4 IMPLANT
WATER STERILE IRR 1000ML POUR (IV SOLUTION) ×8 IMPLANT

## 2022-01-23 NOTE — Brief Op Note (Signed)
01/23/2022  11:52 AM  PATIENT:  Roger Smith  81 y.o. male  PRE-OPERATIVE DIAGNOSIS:  Coronary Artery Disease  POST-OPERATIVE DIAGNOSIS:  Coronary Artery Disease  PROCEDURE: TRANSESOPHAGEAL ECHOCARDIOGRAM (TEE), CORONARY ARTERY BYPASS GRAFTING (CABG) x 4 (LIMA to LAD, SVG to DIAGONAL, LEFT RADIAL ARTERY to OM, SVG to PDA using LEFT RADIAL ARTERY HARVEST, LEFT INTERNAL MAMMARY ARTERY and RIGHT THIGH GREATER SAPHENOUS VEIN HARVESTED ENDOSCOPICALLY  Vein harvest time: 28 minutes  Vein prep time: 14 minutes Radial artery harvest time: 39 minutes Radial artery prep time: 5 minutes  SURGEON:  Surgeon(s) and Role:    Dahlia Byes, MD - Primary  PHYSICIAN ASSISTANT: Lars Pinks PA-C  ASSISTANTS: Ardyth Man RNFA   ANESTHESIA:   general  EBL:  Per anesthesia, perfusion record  BLOOD ADMINISTERED: Two CC PRBC, two FFP, and one PLTS  DRAINS:  Chest tubes placed in the mediastinal and pleural spaces    COUNTS CORRECT:  YES  DICTATION: .Dragon Dictation  PLAN OF CARE: Admit to inpatient   PATIENT DISPOSITION:  ICU - intubated and hemodynamically stable.   Delay start of Pharmacological VTE agent (>24hrs) due to surgical blood loss or risk of bleeding: no  BASELINE WEIGHT: 76.2 kg

## 2022-01-23 NOTE — Anesthesia Procedure Notes (Signed)
Central Venous Catheter Insertion Performed by: Gaynelle Adu, MD, anesthesiologist Start/End5/24/2023 6:55 AM, 01/23/2022 7:15 AM Patient location: Pre-op. Preanesthetic checklist: patient identified, IV checked, site marked, risks and benefits discussed, surgical consent, monitors and equipment checked, pre-op evaluation, timeout performed and anesthesia consent Hand hygiene performed  and maximum sterile barriers used  PA cath was placed.Swan type:thermodilution PA Cath depth:50 Procedure performed without using ultrasound guided technique. Attempts: 1 Patient tolerated the procedure well with no immediate complications.

## 2022-01-23 NOTE — Anesthesia Procedure Notes (Signed)
Procedure Name: Intubation Date/Time: 01/23/2022 8:33 AM Performed by: Lynnell Chad, CRNA Pre-anesthesia Checklist: Patient identified, Emergency Drugs available, Suction available and Patient being monitored Patient Re-evaluated:Patient Re-evaluated prior to induction Oxygen Delivery Method: Circle System Utilized Preoxygenation: Pre-oxygenation with 100% oxygen Induction Type: IV induction Ventilation: Mask ventilation without difficulty Laryngoscope Size: Miller and 2 Grade View: Grade I Tube type: Oral Tube size: 8.0 mm Number of attempts: 1 Airway Equipment and Method: Stylet and Oral airway Placement Confirmation: ETT inserted through vocal cords under direct vision, positive ETCO2 and breath sounds checked- equal and bilateral Secured at: 21 cm Tube secured with: Tape Dental Injury: Teeth and Oropharynx as per pre-operative assessment

## 2022-01-23 NOTE — Discharge Summary (Incomplete)
Physician Discharge Summary       [address]       [place] [address]             [phone]    Patient ID: Roger Smith MRN: [id number] DOB/AGE: December 29, 1940 81 y.o.  Admit date: 01/23/2022 Discharge date: 01/29/2022  Admission Diagnoses: Coronary artery disease Discharge Diagnoses:  S/p CABG x 4 2. Post op a fib 3. History of the following:  Frequent PVCs     Hyperlipidemia     Myocardial infarction ([place])     RBBB (right bundle branch block with left anterior fascicular block)     Syncope and collapse     TIA (transient ischemic attack)     Chronic GERD  Consults: None  Procedure (s):  1.  Coronary artery bypass grafting x 4 (left internal mammary artery to LAD, left radial artery free graft to OM1, saphenous vein graft to diagonal, saphenous vein graft to posterior descending). 2.  Open harvest of the left radial artery. 3.  Endoscopic harvest of the right leg greater saphenous vein by Dr. Prescott Gum on 01/24/2022.  HPI: Very nice retired 81 year old nondiabetic non-smoker with recently diagnosed severe three-vessel coronary artery disease.  A syncopal episode in church prompted his evaluation by cardiology in [place].  An echocardiogram showed inferior wall hypokinesia.  Subsequent cardiac CT calcium score was 700.  He admitted to episodes of exertional chest pain which preceded the syncopal episode.  Patient states he had a brain scan after the syncopal episode at [facility] which was normal.  He was never seen by neurology after discharge.   Following the high calcium score he underwent cardiac catheterization here which demonstrated severe three-vessel coronary disease.  LVEDP is 25.  He did not have venogram performed.   The echocardiogram was performed at [facility] for which images are not available.  EF was listed as 35%.   Patient is retired from working at the [employer].  His wife died 51 years after cardiac transplantation from  lymphoma.  He has daughters which live close by.   Patient is right-hand dominant.  No previous history of thoracic trauma or pneumothorax.   Patient has had outpatient varicose left leg vein injection in the past.  He has bilateral varicose veins below the knee. Dr. Prescott Gum discussed the need for coronary artery bypass grafting surgery. Potential risks, benefits, and complications of the surgery were discussed with the patient and he agreed to proceed with surgery. Pre operative carotid duplex US showed no significant internal carotid artery stenosis bilaterally.  Hospital Course: Patient underwent a CABG x 4. He was transferred from the OR to [facility] ICU in stable condition. He was extubated late the evening of surgery. He was weaned off Nor epinephrine, Epinephrine, and Milrinone drips. Nitro was stopped and he was started on Imdur for left radial artery harvest. Gordy Councilman was removed on post op day one. A line, chest tubes and foley were removed over the next few days. He was transitioned off the Insulin drip. Pre op HGA1C 5.3. We will stop accu checks and SS PRN after transfer. He had expected post op blood loss anemia and required a transfusion on 05/25. He was put on Trinsicon. Last H and H was 10.3/30.4. He also had thrombocytopenia. His last platelet count was 171. He was requiring several liters of oxygen via [place] but was later weaned to room air. He did have nausea on post op day 2. He was given  Zofran and scheduled Reglan, which helped. All wounds are clean, dry, and healing without signs of infection. He has been tolerating a diet and has had a bowel movement.  He developed Atrial Fibrillation and was treated with Amiodarone drip protocol.  He converted to NSR.  He also developed mild confusion overnight which also resolved.  He was treated with Lasix and Zaroxolyn for volume overloaded state.  He was transferred to the progressive care unit on 01/27/2022.  He continued to progress.  He remained in  NSR with occasional PVCs.  Coreg was increased to 6.25 mg bid and Amiodarone was changed to 200 mg bid. Of note, will will likely decrease Amiodarone to 200 mg daily when he comes for staple removal on 02/04/2022. His pacing wires were removed prior to discharge. He is ambulating without difficulty. Wilder Glade has been started (LVEF 20%). Chest tube sutures to be removed today. Home health has been arranged as well. As discussed with Dr. Prescott Gum, patient  is medically stable for discharge home today.     Latest Vital Signs: Blood pressure 110/68, pulse 98, temperature 98.3 F (36.8 C), temperature source Oral, resp. rate 16, height 5\' 9"  (1.753 m), weight 74.6 kg, SpO2 98 %.  Physical Exam:   Cardiovascular: RRR Pulmonary: Clear to auscultation bilaterally Abdomen: Soft, non tender, bowel sounds present. Extremities: Trace bilateral lower extremity edema. Wounds: Clean and dry.  No erythema or signs of infection.  Discharge Condition:Stable and discharged to home.  Recent laboratory studies:  Lab Results  Component Value Date   WBC 10.2 01/29/2022   HGB 10.3 (L) 01/29/2022   HCT 30.7 (L) 01/29/2022   MCV 96.2 01/29/2022   PLT 204 01/29/2022   Lab Results  Component Value Date   NA 133 (L) 01/29/2022   K 3.6 01/29/2022   CL 98 01/29/2022   CO2 29 01/29/2022   CREATININE 0.95 01/29/2022   GLUCOSE 114 (H) 01/29/2022      Diagnostic Studies: DG Chest 2 View  Result Date: 01/29/2022 CLINICAL DATA:  81 year old male with pleural effusions. Postoperative day 6 status post CABG. EXAM: CHEST - 2 VIEW COMPARISON:  Portable chest 01/26/2022 and earlier. FINDINGS: PA and lateral views at 0548 hours. Chest tubes and right IJ introducer sheath removed. Epicardial pacer wires may have also been removed. No pneumothorax or pulmonary edema. Small bilateral pleural effusions. Stable cardiac size and mediastinal contours. Sequelae of CABG. Visualized tracheal air column is within normal limits. No  air bronchograms. No acute osseous abnormality identified. Negative visible bowel gas. IMPRESSION: 1. Chest tubes and right IJ introducer sheath removed. 2. Small bilateral pleural effusions with No pneumothorax or pulmonary edema. Electronically Signed   By: Genevie Ann M.D.   On: 01/29/2022 08:00   DG Chest 2 View  Result Date: 01/22/2022 CLINICAL DATA:  Preoperative study prior to CABG. No chest complaints at this time. EXAM: CHEST - 2 VIEW COMPARISON:  CT coronary arteries December 20, 2021 FINDINGS: The heart size is borderline. The hila and mediastinum are normal. No pneumothorax. No nodules or masses. Scar or atelectasis in the lateral left lung base, unchanged since the scout film from December 20, 2021. No other acute abnormalities are identified. IMPRESSION: No active cardiopulmonary disease. Electronically Signed   By: Dorise Bullion III M.D.   On: 01/22/2022 09:02   CARDIAC CATHETERIZATION  Result Date: 01/02/2022   Prox RCA lesion is 99% stenosed.   Mid RCA lesion is 40% stenosed.   Ost Cx to Prox Cx  lesion is 99% stenosed.   Mid LM to Dist LM lesion is 50% stenosed.   2nd Diag lesion is 60% stenosed.   Mid LAD lesion is 100% stenosed.   Ost LAD to Prox LAD lesion is 70% stenosed. Severe three vessel CAD. Moderate distal left main stenosis Chronic total occlusion mid LAD. The mid and distal vessel fills from collaterals from the RCA and Circumflex. The large diagonal branch has severe stenosis The Circumflex is a large caliber vessel with severe ostial/proximal stenosis The RCA is a large dominant vessel with severe proximal stenosis. Recommendations: He has severe, three vessel CAD involving the left main, ostial Circumflex and proximal RCA with CTO of the mid LAD. The best option for complete revascularization will be CABG. He has no symptoms currently. I will make an outpatient referral to CT surgery to discuss CABG.   DG CHEST PORT 1 VIEW  Result Date: 01/26/2022 CLINICAL DATA:  Evaluate for  pleural effusion. EXAM: PORTABLE CHEST 1 VIEW COMPARISON:  01/25/2022 FINDINGS: Status post median sternotomy and CABG procedure. There are bilateral chest tubes in place. No pneumothorax. Right IJ Cordis is stable in position projecting over the proximal SVC. Lung volumes have improved compared with the previous exam. Continued blunting of the costophrenic angles due to small residual pleural effusions with overlying platelike atelectasis noted. No signs of interstitial edema or airspace disease. IMPRESSION: 1. Bilateral chest tubes in place without pneumothorax. 2. Small residual bilateral pleural effusions and overlying atelectasis. Electronically Signed   By: Kerby Moors M.D.   On: 01/26/2022 09:49   DG Chest Port 1 View  Result Date: 01/25/2022 CLINICAL DATA:  History of CABG EXAM: PORTABLE CHEST 1 VIEW COMPARISON:  Chest x-ray dated Jan 24, 2022 FINDINGS: Interval removal of PA catheter with right IJ line sheath remaining in place. Unchanged position of bilateral chest tubes. Cardiac and mediastinal contours are unchanged post median sternotomy and CABG. Mild basilar predominant opacities, likely due to atelectasis. Small bilateral pleural effusions. No evidence of pneumothorax. IMPRESSION: 1. Interval removal of PA catheter with right IJ line sheath remaining in place. 2. Unchanged position of bilateral chest tubes. No evidence of pneumothorax. Electronically Signed   By: Yetta Glassman M.D.   On: 01/25/2022 08:40   DG Chest Port 1 View  Result Date: 01/24/2022 CLINICAL DATA:  Pneumothorax EXAM: PORTABLE CHEST 1 VIEW COMPARISON:  01/23/2022 FINDINGS: Endotracheal and enteric tubes are no longer present. Right IJ Swan-Ganz catheter is unchanged. Bilateral chest tubes are still present. No pneumothorax. Patchy bibasilar atelectasis. Stable cardiomediastinal contours. IMPRESSION: Lines and tubes as above. No pneumothorax. Patchy bibasilar atelectasis. Electronically Signed   By: Macy Mis M.D.    On: 01/24/2022 08:17   DG Chest Port 1 View  Result Date: 01/23/2022 CLINICAL DATA:  Post CABG EXAM: PORTABLE CHEST 1 VIEW COMPARISON:  Portable exam 1614 hours compared to 01/21/2022 FINDINGS: Tip of endotracheal tube projects 7.1 cm above carina. Tip of nasogastric tube projects over mid esophagus; recommend advancing tube 17 cm to place proximal side-port within stomach. BILATERAL thoracostomy tubes present. RIGHT jugular Swan-Ganz catheter with tip projecting over proximal RIGHT pulmonary artery. Enlargement of cardiac silhouette post CABG with epicardial pacing wires noted. Pulmonary vascular congestion and mild perihilar edema bilaterally. No pleural effusion or pneumothorax. IMPRESSION: Expected postoperative changes with mild perihilar edema. Recommend advancing nasogastric tube 17 cm to place tip in the stomach. Electronically Signed   By: Lavonia Dana M.D.   On: 01/23/2022 16:31   VAS [place]  LOWER EXT SAPHENOUS VEIN MAPPING  Result Date: 01/21/2022 LOWER EXTREMITY VEIN MAPPING Patient Name:  DYLLIN LICHTENSTEIN  Date of Exam:   01/21/2022 Medical Rec #: [id number]      Accession #:    [id number] Date of Birth: April 08, 1941      Patient Gender: M Patient Age:   78 years Exam Location:  [facility] Procedure:      VAS [place] LOWER EXTREMITY SAPHENOUS VEIN MAPPING Referring Phys: Dahlia Byes --------------------------------------------------------------------------------  Indications:        Pre-CABG, varicose veins Other Risk Factors: Patient endorses vein stripping procedure involving left                     leg.  Comparison Study: No prior studies. Performing Technologist: Darlin Coco RDMS, RVT  Examination Guidelines: A complete evaluation includes B-mode imaging, spectral Doppler, color Doppler, and power Doppler as needed of all accessible portions of each vessel. Bilateral testing is considered an integral part of a complete examination. Limited examinations for reoccurring indications may be  performed as noted. +--------------+-----------+----------------------+--------------+-------------+  RT Diameter  RT Findings         GSV           LT Diameter   LT Findings       (cm)                                           (cm)                   +--------------+-----------+----------------------+--------------+-------------+      0.45                    Saphenofemoral         0.33                                                   Junction                                   +--------------+-----------+----------------------+--------------+-------------+      0.37                    Proximal thigh         0.32                   +--------------+-----------+----------------------+--------------+-------------+      0.36                      Mid thigh                          not                                                                    visualized   +--------------+-----------+----------------------+--------------+-------------+      0.37  Distal thigh                        not                                                                    visualized   +--------------+-----------+----------------------+--------------+-------------+      0.30                         Knee                            not                                                                    visualized   +--------------+-----------+----------------------+--------------+-------------+      0.33      branching       Prox calf                          not                                                                    visualized   +--------------+-----------+----------------------+--------------+-------------+      0.27      branching        Mid calf                          not                                                                    visualized    +--------------+-----------+----------------------+--------------+-------------+      0.19                     Distal calf                         not                                                                    visualized   +--------------+-----------+----------------------+--------------+-------------+      0.21  Ankle                            not                                                                    visualized   +--------------+-----------+----------------------+--------------+-------------+ +----------------+-----------+---------------+----------------+-----------+ RT diameter (cm)RT Findings      SSV      LT Diameter (cm)LT Findings +----------------+-----------+---------------+----------------+-----------+       0.20                 Popliteal fossa      0.21                  +----------------+-----------+---------------+----------------+-----------+       0.20                  Proximal calf       0.27                  +----------------+-----------+---------------+----------------+-----------+       0.19       branching    Mid calf          0.18                  +----------------+-----------+---------------+----------------+-----------+       0.25                   Distal calf        0.25                  +----------------+-----------+---------------+----------------+-----------+ Diagnosing physician: Monica Martinez MD Electronically signed by Monica Martinez MD on 01/21/2022 at 5:19:04 PM.    Final    ECHOCARDIOGRAM COMPLETE  Result Date: 01/21/2022    ECHOCARDIOGRAM REPORT   Patient Name:   DAUNTAE POEPPING Date of Exam: 01/21/2022 Medical Rec #:  [id number]     Height:       69.0 in Accession #:    [id number]    Weight:       168.0 lb Date of Birth:  1941/01/27     BSA:          1.918 m Patient Age:    78 years      BP:           125/73 mmHg Patient Gender: M             HR:           73 bpm. Exam Location:   Outpatient Procedure: 2D Echo, Cardiac Doppler and Color Doppler Indications:    CAD Native Vessel I25.10  History:        Patient has prior history of Echocardiogram examinations, most                 recent 11/11/2021. TIA, Arrythmias:RBBB, Signs/Symptoms:Syncope;                 Risk Factors:GERD.  Sonographer:    Eartha Inch Sonographer#2:  Bernadene Person RDCS Referring Phys: [place]  1. Left ventricular ejection fraction, by estimation, is <20%. The left ventricle has severely decreased function. The left ventricle demonstrates global hypokinesis. The left ventricular internal cavity  size was moderately dilated. Left ventricular diastolic parameters are consistent with Grade II diastolic dysfunction (pseudonormalization). Elevated left atrial pressure.  2. Right ventricular systolic function is moderately reduced. The right ventricular size is normal. There is mildly elevated pulmonary artery systolic pressure.  3. Left atrial size was mildly dilated.  4. The mitral valve is normal in structure. Mild mitral valve regurgitation.  5. The aortic valve is tricuspid. Aortic valve regurgitation is not visualized. Aortic valve sclerosis/calcification is present, without any evidence of aortic stenosis.  6. The inferior vena cava is normal in size with greater than 50% respiratory variability, suggesting right atrial pressure of 3 mmHg. FINDINGS  Left Ventricle: Left ventricular ejection fraction, by estimation, is <20%. The left ventricle has severely decreased function. The left ventricle demonstrates global hypokinesis. The left ventricular internal cavity size was moderately dilated. There is no left ventricular hypertrophy. Left ventricular diastolic parameters are consistent with Grade II diastolic dysfunction (pseudonormalization). Elevated left atrial pressure. Right Ventricle: The right ventricular size is normal. Right vetricular wall thickness was not assessed. Right ventricular  systolic function is moderately reduced. There is mildly elevated pulmonary artery systolic pressure. The tricuspid regurgitant velocity is 2.93 m/s, and with an assumed right atrial pressure of 3 mmHg, the estimated right ventricular systolic pressure is 123XX123 mmHg. Left Atrium: Left atrial size was mildly dilated. Right Atrium: Right atrial size was normal in size. Pericardium: There is no evidence of pericardial effusion. Mitral Valve: The mitral valve is normal in structure. Mild mitral valve regurgitation. Tricuspid Valve: The tricuspid valve is normal in structure. Tricuspid valve regurgitation is mild. Aortic Valve: The aortic valve is tricuspid. Aortic valve regurgitation is not visualized. Aortic valve sclerosis/calcification is present, without any evidence of aortic stenosis. Pulmonic Valve: The pulmonic valve was normal in structure. Pulmonic valve regurgitation is mild to moderate. Aorta: The aortic root is normal in size and structure. Venous: The inferior vena cava is normal in size with greater than 50% respiratory variability, suggesting right atrial pressure of 3 mmHg. IAS/Shunts: No atrial level shunt detected by color flow Doppler.  LEFT VENTRICLE PLAX 2D LVIDd:         5.80 cm      Diastology LVIDs:         5.40 cm      LV e' medial:    4.73 cm/s LV PW:         0.80 cm      LV E/e' medial:  23.5 LV IVS:        0.80 cm      LV e' lateral:   7.65 cm/s LVOT diam:     2.00 cm      LV E/e' lateral: 14.5 LV SV:         57 LV SV Index:   30 LVOT Area:     3.14 cm  LV Volumes (MOD) LV vol d, MOD A2C: 195.0 ml LV vol d, MOD A4C: 151.0 ml LV vol s, MOD A2C: 138.0 ml LV vol s, MOD A4C: 125.0 ml LV SV MOD A2C:     57.0 ml LV SV MOD A4C:     151.0 ml LV SV MOD BP:      41.9 ml RIGHT VENTRICLE RV S prime:     14.50 cm/s LEFT ATRIUM             Index        RIGHT ATRIUM           Index  LA diam:        4.80 cm 2.50 cm/m   RA Area:     17.50 cm LA Vol (A2C):   86.4 ml 45.04 ml/m  RA Volume:   43.30 ml  22.57  ml/m LA Vol (A4C):   61.8 ml 32.21 ml/m LA Biplane Vol: 73.6 ml 38.36 ml/m  AORTIC VALVE LVOT Vmax:   111.00 cm/s LVOT Vmean:  69.200 cm/s LVOT VTI:    0.182 m  AORTA Ao Root diam: 2.90 cm Ao Asc diam:  3.90 cm MITRAL VALVE                  TRICUSPID VALVE MV Area (PHT): 4.10 cm       TR Peak grad:   34.3 mmHg MV Decel Time: 185 msec       TR Vmax:        293.00 cm/s MR Peak grad:    82.6 mmHg MR Mean grad:    54.0 mmHg    SHUNTS MR Vmax:         454.50 cm/s  Systemic VTI:  0.18 m MR Vmean:        347.0 cm/s   Systemic Diam: 2.00 cm MR PISA:         0.57 cm MR PISA Eff ROA: 4 mm MR PISA Radius:  0.30 cm MV E velocity: 111.00 cm/s MV A velocity: 79.10 cm/s MV E/A ratio:  1.40 Dorris Carnes MD Electronically signed by Dorris Carnes MD Signature Date/Time: 01/21/2022/5:22:30 PM    Final    ECHO INTRAOPERATIVE TEE  Result Date: 01/23/2022  *INTRAOPERATIVE TRANSESOPHAGEAL REPORT *  Patient Name:   SHARIQ DEPOLO  Date of Exam: 01/23/2022 Medical Rec #:  [id number]      Height:       69.0 in Accession #:    [id number]     Weight:       168.0 lb Date of Birth:  12-19-1940      BSA:          1.92 m Patient Age:    31 years       BP:           144/90 mmHg Patient Gender: M              HR:           85 bpm. Exam Location:  Anesthesiology Transesophogeal exam was perform intraoperatively during surgical procedure. Patient was closely monitored under general anesthesia during the entirety of examination. Indications:     Coronary artery disease of native artery of native heart with                  stable angina pectoris [facility]) [I25.118] Performing Phys: [place] VANTRIGT Diagnosing Phys: Roderic Palau MD Complications: No known complications during this procedure. POST-OP IMPRESSIONS Overall, there were no significant changes from pre-bypass. PRE-OP FINDINGS  Left Ventricle: The left ventricle has severely reduced systolic function, with an ejection fraction of 20-30%. The cavity size was mildly dilated. There is no  increase in left ventricular wall thickness. Left ventricular diffuse hypokinesis. There is no left ventricular hypertrophy. Right Ventricle: The right ventricle has normal systolic function. The cavity was normal. There is no increase in right ventricular wall thickness. Left Atrium: Left atrial size was not assessed. No left atrial/left atrial appendage thrombus was detected. Right Atrium: Right atrial size was not assessed. Interatrial Septum: No atrial level shunt detected by color flow Doppler. Pericardium: There is  no evidence of pericardial effusion. Mitral Valve: The mitral valve is normal in structure. Mitral valve regurgitation is trivial by color flow Doppler. The MR jet is centrally-directed. Tricuspid Valve: The tricuspid valve was normal in structure. Tricuspid valve regurgitation is mild by color flow Doppler. Aortic Valve: The aortic valve is tricuspid Aortic valve regurgitation is trivial by color flow Doppler. Pulmonic Valve: The pulmonic valve was normal in structure. Pulmonic valve regurgitation is trivial by color flow Doppler. +--------------+-------++ LEFT VENTRICLE        +--------------+-------++ PLAX 2D               +--------------+-------++ LVIDd:        5.90 cm +--------------+-------++ LVIDs:        5.10 cm +--------------+-------++ LV SV:        49 ml   +--------------+-------++ LV SV Index:  25.54   +--------------+-------++                       +--------------+-------++  Roderic Palau MD Electronically signed by Roderic Palau MD Signature Date/Time: 01/23/2022/4:28:13 PM    Final    VAS US DOPPLER PRE CABG  Result Date: 01/21/2022 PREOPERATIVE VASCULAR EVALUATION Patient Name:  TEJ KLINDT  Date of Exam:   01/21/2022 Medical Rec #: [id number]      Accession #:    [id number] Date of Birth: October 15, 1940      Patient Gender: M Patient Age:   81 years Exam Location:  [facility] Procedure:      VAS US DOPPLER PRE CABG Referring Phys: Collier [place]  VANTRIGT --------------------------------------------------------------------------------  Indications:      Pre-CABG. Risk Factors:     Hypertension, hyperlipidemia. Other Factors:    TIA. Comparison Study: No prior studies. Performing Technologist: Darlin Coco RDMS RVT  Examination Guidelines: A complete evaluation includes B-mode imaging, spectral Doppler, color Doppler, and power Doppler as needed of all accessible portions of each vessel. Bilateral testing is considered an integral part of a complete examination. Limited examinations for reoccurring indications may be performed as noted.  Right Carotid Findings: +----------+--------+--------+--------+------------+------------------+           PSV cm/sEDV cm/sStenosisDescribe    Comments           +----------+--------+--------+--------+------------+------------------+ CCA Prox  86      12                                             +----------+--------+--------+--------+------------+------------------+ CCA Distal60      14                          intimal thickening +----------+--------+--------+--------+------------+------------------+ ICA Prox  69      23      1-39%   heterogenoustortuous           +----------+--------+--------+--------+------------+------------------+ ICA Distal49      22                          tortuous           +----------+--------+--------+--------+------------+------------------+ ECA       82      15                          tortuous           +----------+--------+--------+--------+------------+------------------+ +----------+--------+-------+----------------+------------+  PSV cm/sEDV cmsDescribe        Arm Pressure +----------+--------+-------+----------------+------------+ Subclavian112            Multiphasic, WNL             +----------+--------+-------+----------------+------------+ +---------+--------+--+--------+--+---------+ VertebralPSV cm/s49EDV  cm/s16Antegrade +---------+--------+--+--------+--+---------+ Left Carotid Findings: +----------+--------+--------+--------+------------+------------------+           PSV cm/sEDV cm/sStenosisDescribe    Comments           +----------+--------+--------+--------+------------+------------------+ CCA Prox  94      20                                             +----------+--------+--------+--------+------------+------------------+ CCA Distal77      20                          intimal thickening +----------+--------+--------+--------+------------+------------------+ ICA Prox  49      16      1-39%   heterogenous                   +----------+--------+--------+--------+------------+------------------+ ICA Distal51      17                          tortuous           +----------+--------+--------+--------+------------+------------------+ ECA       88      15                                             +----------+--------+--------+--------+------------+------------------+ +----------+--------+--------+----------------+------------+ SubclavianPSV cm/sEDV cm/sDescribe        Arm Pressure +----------+--------+--------+----------------+------------+           76              Multiphasic, WNL             +----------+--------+--------+----------------+------------+ +---------+--------+--+--------+-+---------+ VertebralPSV cm/s25EDV cm/s8Antegrade +---------+--------+--+--------+-+---------+  ABI Findings: +--------+------------------+-----+---------+--------+ Right   Rt Pressure (mmHg)IndexWaveform Comment  +--------+------------------+-----+---------+--------+ [id number]                    triphasic         +--------+------------------+-----+---------+--------+ PTA     100               0.81 biphasic          +--------+------------------+-----+---------+--------+ DP      19                0.15 triphasic          +--------+------------------+-----+---------+--------+ +--------+------------------+-----+---------+-------+ Left    Lt Pressure (mmHg)IndexWaveform Comment +--------+------------------+-----+---------+-------+ [id number]                    triphasic        +--------+------------------+-----+---------+-------+ PTA     154               1.25 triphasic        +--------+------------------+-----+---------+-------+ DP      116               0.94 triphasic        +--------+------------------+-----+---------+-------+  Right Doppler Findings: +--------+--------+-----+---------+--------+ Site    PressureIndexDoppler  Comments +--------+--------+-----+---------+--------+ [id number]  triphasic         +--------+--------+-----+---------+--------+ Radial               triphasic         +--------+--------+-----+---------+--------+ Ulnar                triphasic         +--------+--------+-----+---------+--------+  Left Doppler Findings: +--------+--------+-----+---------+--------+ Site    PressureIndexDoppler  Comments +--------+--------+-----+---------+--------+ [id number]          triphasic         +--------+--------+-----+---------+--------+ Radial               triphasic         +--------+--------+-----+---------+--------+ Ulnar                triphasic         +--------+--------+-----+---------+--------+  Summary: Right Carotid: Velocities in the right ICA are consistent with a 1-39% stenosis. Left Carotid: Velocities in the left ICA are consistent with a 1-39% stenosis. Vertebrals:  Bilateral vertebral arteries demonstrate antegrade flow. Subclavians: Normal flow hemodynamics were seen in bilateral subclavian              arteries. Right ABI: Resting right ankle-brachial index is within normal range. No evidence of significant right lower extremity arterial disease. Left ABI: Resting left ankle-brachial index is within normal range. No  evidence of significant left lower extremity arterial disease. Right Upper Extremity: Doppler waveforms remain within normal limits with right radial compression. Doppler waveforms remain within normal limits with right ulnar compression. Left Upper Extremity: Doppler waveforms remain within normal limits with left radial compression. Doppler waveforms remain within normal limits with left ulnar compression.  Electronically signed by Monica Martinez MD on 01/21/2022 at 5:21:44 PM.    Final       Discharge Medications: Allergies as of 01/29/2022   No Known Allergies      Medication List     STOP taking these medications    metoprolol tartrate 25 MG tablet Commonly known as: LOPRESSOR   nitroGLYCERIN 0.4 MG SL tablet Commonly known as: NITROSTAT       TAKE these medications    amiodarone 200 MG tablet Commonly known as: PACERONE Take 1 tablet (200 mg total) by mouth 2 (two) times daily.   aspirin EC 325 MG tablet Take 1 tablet (325 mg total) by mouth daily. What changed:  medication strength how much to take additional instructions   carvedilol 6.25 MG tablet Commonly known as: COREG Take 1 tablet (6.25 mg total) by mouth 2 (two) times daily with a meal.   dapagliflozin propanediol 10 MG Tabs tablet Commonly known as: FARXIGA Take 1 tablet (10 mg total) by mouth daily.   famotidine 40 MG tablet Commonly known as: PEPCID Take 40 mg by mouth daily.   Fish Oil 1200 MG Caps Take 1,200 mg by mouth daily.   furosemide 20 MG tablet Commonly known as: LASIX Take 1 tablet (20 mg total) by mouth daily. For 5 days then stop. Start taking on: Jan 30, 2022   isosorbide mononitrate 30 MG 24 hr tablet Commonly known as: IMDUR Take 0.5 tablets (15 mg total) by mouth daily.   multivitamin-lutein Caps capsule Take 1 capsule by mouth 2 (two) times daily.   potassium chloride 10 MEQ tablet Commonly known as: KLOR-CON M Take 1 tablet (10 mEq total) by mouth daily. For 5  days then stop   rosuvastatin 10 MG tablet Commonly known as: CRESTOR Take 1 tablet (  10 mg total) by mouth daily.   traMADol 50 MG tablet Commonly known as: ULTRAM Take 1 tablet (50 mg total) by mouth every 6 (six) hours as needed for moderate pain.               Durable Medical Equipment  (From admission, onward)           Start     Ordered   01/28/22 0959  For home use only DME Walker rolling  Once       Question Answer Comment  Walker: With 5 Inch Wheels   Patient needs a walker to treat with the following condition S/P CABG x 4      01/28/22 1001   01/28/22 0905  For home use only DME Walker rolling  Once       Question Answer Comment  Walker: With 5 Inch Wheels   Patient needs a walker to treat with the following condition S/P CABG (coronary artery bypass graft)   Patient needs a walker to treat with the following condition Physical deconditioning      01/28/22 0904           The patient has been discharged on:   1.Beta Blocker:  Yes [ X  ]                              No   [   ]                              If No, reason:  2.Ace Inhibitor/ARB: Yes [   ]                                     No  [ N   ]                                     If No, reason: labile BP  3.Statin:   Yes [  X ]                  No  [   ]                  If No, reason:  4.Ecasa:  Yes  [ X  ]                  No   [   ]                  If No, reason:  Patient had ACS upon admission: No   Plavix/P2Y12 inhibitor: Yes [   ]                                      No  [ x  ]   Follow Up Appointments:  Follow-up Information     Park Liter, MD. Go on 02/11/2022.   Specialty: Cardiology Why: Appointment time is at 8:40 am Contact information: [address] [place] [address] [phone]         Dr. Prescott Gum. Go on 02/18/2022.   Why: PA/LAT CXR to be  taken (at [facility] which is in the same building as Dr. Lucianne Lei Trigt's office) on 06/19 at 1:00  pm;Appointment time is at 1:30 pm Contact information: [address] [place], [place], [place] [address] [phone]        [facility] [place]. Go on 02/04/2022.   Specialty: Cardiothoracic Surgery Why: Appointment is with nurse only for staple removal of LUE. Appointment time is at 10:30 am Contact information: [place], [place]                Signed: Arnoldo Lenis 01/29/2022, 11:54 AM   Walking in hall nsr    patient examined and medical record reviewed,agree with above note. Donielle Liston Alba 01/29/2022 DC instructions reviewed with patient

## 2022-01-23 NOTE — Hospital Course (Addendum)
HPI: Very nice retired 81 year old nondiabetic non-smoker with recently diagnosed severe three-vessel coronary artery disease.  A syncopal episode in church prompted his evaluation by cardiology in [place].  An echocardiogram showed inferior wall hypokinesia.  Subsequent cardiac CT calcium score was 700.  He admitted to episodes of exertional chest pain which preceded the syncopal episode.  Patient states he had a brain scan after the syncopal episode at [facility] which was normal.  He was never seen by neurology after discharge.   Following the high calcium score he underwent cardiac catheterization here which demonstrated severe three-vessel coronary disease.  LVEDP is 25.  He did not have venogram performed.   The echocardiogram was performed at [facility] for which images are not available.  EF was listed as 35%.   Patient is retired from working at the [employer].  His wife died 10 years after cardiac transplantation from lymphoma.  He has daughters which live close by.   Patient is right-hand dominant.  No previous history of thoracic trauma or pneumothorax.   Patient has had outpatient varicose left leg vein injection in the past.  He has bilateral varicose veins below the knee. Dr. Donata Clay discussed the need for coronary artery bypass grafting surgery. Potential risks, benefits, and complications of the surgery were discussed with the patient and he agreed to proceed with surgery. Pre operative carotid duplex US showed no significant internal carotid artery stenosis bilaterally.  Hospital Course: Patient underwent a CABG x 4. He was transferred from the OR to [facility] ICU in stable condition. He was extubated late the evening of surgery. He was weaned off Nor epinephrine, Epinephrine, and Milrinone drips. Nitro was stopped and he was started on Imdur for left radial artery harvest. Theone Murdoch was removed on post op day one. A line, chest tubes and foley were removed over the next few  days. He was transitioned off the Insulin drip. Pre op HGA1C 5.3. We will stop accu checks and SS PRN after transfer. He had expected post op blood loss anemia and required a transfusion on 05/25. He was put on Trinsicon. Last H and H was 10.3/30.4. He also had thrombocytopenia. His last platelet count was 171. He was requiring several liters of oxygen via [place] but was later weaned to room air. He did have nausea on post op day 2. He was given Zofran and scheduled Reglan, which helped. All wounds are clean, dry, and healing without signs of infection. He has been tolerating a diet and has had a bowel movement.  He developed Atrial Fibrillation and was treated with Amiodarone drip protocol.  He converted to NSR.  He also developed mild confusion overnight which also resolved.  He was treated with Lasix and Zaroxolyn for volume overloaded state.  He was transferred to the progressive care unit on 01/27/2022.  He continued to progress.  He remained in NSR with occasional PVCs.  His pacing wires were removed prior to discharge. He is ambulating without difficulty. He is medically stable for discharge home today.

## 2022-01-23 NOTE — Progress Notes (Signed)
Rapid wean initiated per protocol  

## 2022-01-23 NOTE — Anesthesia Procedure Notes (Signed)
Arterial Line Insertion Start/End5/24/2023 6:45 AM, 01/23/2022 7:00 AM Performed by: Gaynelle Adu, MD, anesthesiologist  Lidocaine 1% used for infiltration and patient sedated Right, radial was placed Catheter size: 20 G  Attempts: 2 Procedure performed without using ultrasound guided technique. Following insertion, dressing applied and Biopatch. Post procedure assessment: unchanged  Patient tolerated the procedure well with no immediate complications.

## 2022-01-23 NOTE — Anesthesia Procedure Notes (Signed)
Central Venous Catheter Insertion Performed by: Roderic Palau, MD, anesthesiologist Start/End5/24/2023 6:55 AM, 01/23/2022 7:15 AM Patient location: Pre-op. Preanesthetic checklist: patient identified, IV checked, site marked, risks and benefits discussed, surgical consent, monitors and equipment checked, pre-op evaluation, timeout performed and anesthesia consent Position: Trendelenburg Lidocaine 1% used for infiltration and patient sedated Hand hygiene performed , maximum sterile barriers used  and Seldinger technique used Catheter size: 9 Fr Total catheter length 10. Central line was placed.MAC introducer Procedure performed using ultrasound guided technique. Ultrasound Notes:anatomy identified, needle tip was noted to be adjacent to the nerve/plexus identified, no ultrasound evidence of intravascular and/or intraneural injection and image(s) printed for medical record Attempts: 1 Following insertion, line sutured, dressing applied and Biopatch. Post procedure assessment: blood return through all ports, free fluid flow and no air  Patient tolerated the procedure well with no immediate complications.

## 2022-01-23 NOTE — H&P (Signed)
PCP is Paulina Fusi, MD Referring Provider is No ref. provider found  No chief complaint on file.   HPI: Patient examined, images of coronary arteriogram and echocardiogram and cardiac CT personally reviewed and discussed with patient and daughter.  Very nice retired 81 year old nondiabetic non-smoker with recently diagnosed severe three-vessel coronary artery disease.  A syncopal episode in church prompted his evaluation by cardiology in [place].  An echocardiogram showed inferior wall hypokinesia.  Subsequent cardiac CT calcium score was 700.  He admitted to episodes of exertional chest pain which preceded the syncopal episode.  Patient states he had a brain scan after the syncopal episode at [facility] which was normal.  He was never seen by neurology after discharge.  Following the high calcium score he underwent cardiac catheterization here which demonstrated severe three-vessel coronary disease.  LVEDP is 25.  He did not have venogram performed.  The echocardiogram was performed at [facility] for which images are not available.  EF was listed as 35%.  Patient is retired from working at the [employer].  His wife died 10 years after cardiac transplantation from lymphoma.  He has daughters which live close by.  Only prior surgical history of bilateral inguinal hernia repair under general anesthesia without complication or surgical bleeding.  Patient is right-hand dominant.  No previous history of thoracic trauma or pneumothorax.  Patient has had outpatient varicose left leg vein injection in the past.  He has bilateral varicose veins below the knee.  Past Medical History:  Diagnosis Date   Chronic GERD    Coronary artery disease    Frequent PVCs    Hyperlipidemia    Myocardial infarction (HCC)    RBBB (right bundle branch block with left anterior fascicular block)    Syncope and collapse    TIA (transient ischemic attack)     Past Surgical History:  Procedure  Laterality Date   CATARACT EXTRACTION Bilateral 2015   INGUINAL HERNIA REPAIR Right 2005   INGUINAL HERNIA REPAIR Left 2017   Dr. Dimas Chyle   LEFT HEART CATH AND CORONARY ANGIOGRAPHY N/A 01/02/2022   Procedure: LEFT HEART CATH AND CORONARY ANGIOGRAPHY;  Surgeon: Kathleene Hazel, MD;  Location: [facility] INVASIVE CV LAB;  Service: Cardiovascular;  Laterality: N/A;    Family History  Problem Relation Age of Onset   Heart attack Mother    Heart disease Mother    Stomach cancer Maternal Grandfather     Social History Social History   Tobacco Use   Smoking status: Never   Smokeless tobacco: Never  Vaping Use   Vaping Use: Never used  Substance Use Topics   Alcohol use: Never   Drug use: Never    Current Facility-Administered Medications  Medication Dose Route Frequency Provider Last Rate Last Admin   ceFAZolin (ANCEF) IVPB 2g/100 mL premix  2 g Intravenous To OR Lovett Sox, MD       ceFAZolin (ANCEF) IVPB 2g/100 mL premix  2 g Intravenous To OR Lovett Sox, MD       chlorhexidine (HIBICLENS) 4 % liquid 2 application.  30 mL Topical UD Lovett Sox, MD       [facility]iac Surgery, Patient & Family Education   Does not apply Once Lovett Sox, MD       dexmedetomidine (PRECEDEX) 400 MCG/100ML (4 mcg/mL) infusion  0.1-0.7 mcg/kg/hr Intravenous To OR Lovett Sox, MD       EPINEPHrine (ADRENALIN) 5 mg in NS 250 mL (0.02 mg/mL) premix infusion  0-10  mcg/min Intravenous To OR Lovett Sox, MD       heparin 30,000 units/NS 1000 mL solution for CELLSAVER   Other To OR Lovett Sox, MD       heparin sodium (porcine) 2,500 Units, papaverine 30 mg in electrolyte-A (PLASMALYTE-A PH 7.4) 500 mL irrigation   Irrigation To OR Lovett Sox, MD       insulin regular, human (MYXREDLIN) 100 units/ 100 mL infusion   Intravenous To OR Lovett Sox, MD       lactated ringers infusion   Intravenous Continuous Lowella Curb, MD 10 mL/hr at 01/23/22 [id number] New Bag at  01/23/22 4034   magnesium sulfate (IV Push/IM) injection 40 mEq  40 mEq Other To OR Lovett Sox, MD       metoprolol tartrate (LOPRESSOR) tablet 12.5 mg  12.5 mg Oral Once Lovett Sox, MD       milrinone (PRIMACOR) 20 MG/100 ML (0.2 mg/mL) infusion  0.3 mcg/kg/min Intravenous To OR Lovett Sox, MD       nitroGLYCERIN 50 mg in dextrose 5 % 250 mL (0.2 mg/mL) infusion  2-200 mcg/min Intravenous To OR Lovett Sox, MD       norepinephrine (LEVOPHED) 4mg  in (0.016 mg/mL) premix infusion  0-40 mcg/min Intravenous To OR , MD       phenylephrine (NEO-SYNEPHRINE) 20mg /NS Lovett Sox premix infusion  30-200 mcg/min Intravenous To OR , MD       potassium chloride injection 80 mEq  80 mEq Other To OR , MD       tranexamic acid (CYKLOKAPRON) 2,500 mg in sodium chloride 0.9 % 250 mL (10 mg/mL) infusion  1.5 mg/kg/hr Intravenous To OR Lovett Sox, MD       tranexamic acid (CYKLOKAPRON) bolus via infusion - over 30 minutes 1,141.5 mg  15 mg/kg Intravenous To OR Lovett Sox, MD       tranexamic acid (CYKLOKAPRON) pump prime solution 152 mg  2 mg/kg Intracatheter To OR Lovett Sox, MD       vancomycin (VANCOREADY) IVPB 1250 mg/250 mL  1,250 mg Intravenous To OR Lovett Sox, MD        No Known Allergies  Review of Systems:                   Review of Systems :  [ y ] = yes, [  ] = no        General :  Weight gain [   ]    Weight loss  [   ]  Fatigue [  ]  Fever [  ]  Chills  [  ]                                          HEENT    Headache [  ]  Dizziness [  ]  Blurred vision [  ] Glaucoma  [  ]                          Nosebleeds [  ] Painful or loose teeth [  ]        Cardiac :  Chest pain/ pressure [ y exertional]  Resting SOB [  ] exertional SOB [  ]  Orthopnea [  ]  Pedal edema  [  ]  Palpitations [  ] Syncope/presyncope                         Paroxysmal nocturnal dyspnea [  ]         Pulmonary :  cough [  ]  wheezing [  ]  Hemoptysis [  ] Sputum [  ] Snoring [  ]                              Pneumothorax [  ]  Sleep apnea [  ]        GI : Vomiting [  ]  Dysphagia [  ]  Melena  [  ]  Abdominal pain [  ] BRBPR [  ]              Heart burn [  ]  Constipation [  ] Diarrhea  [  ] Colonoscopy [   ]        GU : Hematuria [  ]  Dysuria [  ]  Nocturia [  ] UTI's [  ]        Vascular : Claudication [  ]  Rest pain [  ]  DVT [  ] Vein stripping [  ] leg ulcers [  ]                          TIA [  ] Stroke [  ]  Varicose veins [ y ]        NEURO :  Headaches  [  ] Seizures [  ] Vision changes [  ] Paresthesias [  ]      history of syncope as noted.  Right-hand-dominant.                                         Musculoskeletal :  Arthritis [  ] Gout  [  ]  Back pain [  ]  Joint pain [  ]        Skin :  Rash [  ]  Melanoma [  ] Sores [  ]        Heme : Bleeding problems [  ]Clotting Disorders [  ] Anemia [  ]Blood Transfusion         Endocrine : Diabetes [  ] Heat or Cold intolerance [  ] Polyuria [  ]excessive thirst         Psych : Depression [  ]  Anxiety [  ]  Psych hospitalizations [  ] Memory change [  ]                                                                            BP 125/76   Pulse 74   Temp 98.1 F (36.7 C)   Resp 17   Ht  (1.753 m)   Wt 76.2 kg  SpO2 98%   BMI 24.81 kg/m  Physical Exam:     Physical Exam  General: Well-nourished but thin 81 year old male who appears to be in good health no distress HEENT: Normocephalic pupils equal , dentition adequate bilateral plates Neck: Supple without JVD, adenopathy, or bruit Chest: Clear to auscultation, symmetrical breath sounds, no rhonchi, no tenderness             or deformity Cardiovascular: Regular rate and rhythm, no murmur, no gallop, peripheral pulses             palpable in all extremities Abdomen:  Soft, nontender, no palpable mass or organomegaly Extremities: Warm, well-perfused, no  clubbing cyanosis edema or tenderness,        Bilateral varicose veins but      no venous stasis changes of the legs Rectal/GU: Deferred Neuro: Grossly non--focal and symmetrical throughout Skin: Clean and dry without rash or ulceration   Diagnostic Tests: Patient has severe three-vessel coronary disease with 95% proximal RCA, Total LAD, 80% circumflex and 90% stenosis of the large diagonal. LVEF depressed although echo images are pending. Impression: Severe multivessel coronary disease with reduced LV function.  Patient would benefit from multivessel bypass grafting for preservation of LV function, improve survival and relief of exertional angina.  Plan: [facility].  Because of his varicose veins he may need left radial artery as conduit.  Prior to surgery he will receive an echocardiogram to further evaluate his LV function and valves, saphenous vein mapping, and pre-CABG Dopplers to assess carotids. I discussed the details of surgery with the patient and his daughter as well as the benefits and risks.  They understand and agree to proceed with the plan for surgery on May 24.  Lovett SoxPeter Carmella Kees, MD [facility] [phone]  Pre Procedure note for inpatients:   Roger GoldsJohnny Smith has been scheduled for Procedure(s): CORONARY ARTERY BYPASS GRAFTING (CABG) (N/A) RADIAL ARTERY HARVEST (Left) TRANSESOPHAGEAL ECHOCARDIOGRAM (TEE) (N/A) today. The various methods of treatment have been discussed with the patient. After consideration of the risks, benefits and treatment options the patient has consented to the planned procedure.   The patient has been seen and labs reviewed. There are no changes in the patient's condition to prevent proceeding with the planned procedure today.  Recent labs:  Lab Results  Component Value Date   WBC 10.8 (H) 01/21/2022   HGB 14.3 01/21/2022   HCT 41.3 01/21/2022   PLT 187 01/21/2022   GLUCOSE 120 (H)  01/21/2022   ALT 15 01/21/2022   AST 18 01/21/2022   NA 138 01/21/2022   K 4.3 01/21/2022   CL 108 01/21/2022   CREATININE 0.90 01/21/2022   BUN 13 01/21/2022   CO2 21 (L) 01/21/2022   INR 1.0 01/21/2022   HGBA1C 5.3 01/21/2022    Lovett SoxPeter Hailey Stormer, MD 01/23/2022 7:36 AM

## 2022-01-23 NOTE — Anesthesia Postprocedure Evaluation (Signed)
Anesthesia Post Note  Patient: Theme park manager  Procedure(s) Performed: CORONARY ARTERY BYPASS GRAFTING (CABG) X4 USING LEFT INTERNAL MAMMARY ARTERY, LEFT RADIAL ARTERY, AND RIGHT GREATER SAPHENOUS VEIN HARVESTED ENDOSCOPICALLY (Chest) RADIAL ARTERY HARVEST (Left: Arm Lower) TRANSESOPHAGEAL ECHOCARDIOGRAM (TEE)     Patient location during evaluation: SICU Anesthesia Type: General Level of consciousness: sedated Pain management: pain level controlled Vital Signs Assessment: post-procedure vital signs reviewed and stable Respiratory status: patient remains intubated per anesthesia plan Cardiovascular status: stable Postop Assessment: no apparent nausea or vomiting Anesthetic complications: no   No notable events documented.  Last Vitals:  Vitals:   01/23/22 1635 01/23/22 1705  BP: (!) 106/43 (!) 129/52  Pulse: 100 90  Resp: 15 12  Temp:  (!) 36.3 C  SpO2: 100% 100%    Last Pain:  Vitals:   01/23/22 1705  TempSrc: Core (Comment)  PainSc:                  Nettie Cromwell,W. EDMOND

## 2022-01-23 NOTE — Transfer of Care (Signed)
Immediate Anesthesia Transfer of Care Note  Patient: Roger Smith  Procedure(s) Performed: CORONARY ARTERY BYPASS GRAFTING (CABG) X4 USING LEFT INTERNAL MAMMARY ARTERY, LEFT RADIAL ARTERY, AND RIGHT GREATER SAPHENOUS VEIN HARVESTED ENDOSCOPICALLY (Chest) RADIAL ARTERY HARVEST (Left: Arm Lower) TRANSESOPHAGEAL ECHOCARDIOGRAM (TEE)  Patient Location: ICU  Anesthesia Type:General  Level of Consciousness: Patient remains intubated per anesthesia plan  Airway & Oxygen Therapy: Patient remains intubated per anesthesia plan and Patient placed on Ventilator (see vital sign flow sheet for setting)  Post-op Assessment: Report given to RN and Post -op Vital signs reviewed and stable  Post vital signs: Reviewed and stable  Last Vitals:  Vitals Value Taken Time  BP 106/43 01/23/22 1635  Temp 36.4 C 01/23/22 1643  Pulse 95 01/23/22 1643  Resp 12 01/23/22 1643  SpO2 100 % 01/23/22 1643  Vitals shown include unvalidated device data.  Last Pain:  Vitals:   01/23/22 0627  PainSc: 0-No pain         Complications: No notable events documented.

## 2022-01-23 NOTE — Discharge Instructions (Signed)

## 2022-01-23 NOTE — Procedures (Signed)
Extubation Procedure Note  Patient Details:   Name: Roger Smith DOB: 1941-05-27 MRN: [id number]   Airway Documentation:    Vent end date: 01/23/22 Vent end time: 2300   Evaluation  O2 sats: stable throughout Complications: No apparent complications Patient did tolerate procedure well. Bilateral Breath Sounds: Clear, Diminished   Yes  Pt extubated to 4L [place] per protocol.  NIF: -34 VC: IS: 625.  Positive cuff leak, no stridor noted, pt able to say his name.  Pt tolerated procedure well. Sp02 97%    Berton Bon 01/23/2022, 11:00 PM

## 2022-01-23 NOTE — Progress Notes (Signed)
Patient ID: Roger Smith, male   DOB: 1941-05-30, 81 y.o.   MRN: [id number]  TCTS Evening Rounds:   Hemodynamically stable on milrinone 0.25, epi 3, NE 7. CI = 3.2 PA 22/9  Asleep on vent.  Urine output good  CT output low Receiving cryo for low fibrinogen 148   CBC    Component Value Date/Time   WBC 10.8 (H) 01/21/2022 1513   RBC 4.23 01/21/2022 1513   HGB 8.8 (L) 01/23/2022 1647   HGB 14.7 12/26/2021 1410   HCT 26.0 (L) 01/23/2022 1647   HCT 42.7 12/26/2021 1410   PLT 124 (L) 01/23/2022 1330   PLT 241 12/26/2021 1410   MCV 97.6 01/21/2022 1513   MCV 97 12/26/2021 1410   MCH 33.8 01/21/2022 1513   MCHC 34.6 01/21/2022 1513   RDW 12.4 01/21/2022 1513   RDW 12.3 12/26/2021 1410     BMET    Component Value Date/Time   NA 139 01/23/2022 1647   NA 141 12/26/2021 1410   K 3.6 01/23/2022 1647   CL 103 01/23/2022 1514   CO2 21 (L) 01/21/2022 1513   GLUCOSE 140 (H) 01/23/2022 1514   BUN 12 01/23/2022 1514   BUN 12 12/26/2021 1410   CREATININE 0.40 (L) 01/23/2022 1514   CALCIUM 10.1 01/21/2022 1513   EGFR 85 12/26/2021 1410   GFRNONAA >60 01/21/2022 1513     A/P:  Stable postop course. Continue current plans

## 2022-01-24 ENCOUNTER — Encounter (HOSPITAL_COMMUNITY): Payer: Self-pay | Admitting: Cardiothoracic Surgery

## 2022-01-24 ENCOUNTER — Inpatient Hospital Stay (HOSPITAL_COMMUNITY): Payer: Medicare Other

## 2022-01-24 ENCOUNTER — Other Ambulatory Visit (HOSPITAL_COMMUNITY): Payer: Self-pay

## 2022-01-24 LAB — POCT I-STAT 7, (LYTES, BLD GAS, ICA,H+H)
Acid-base deficit: 3 mmol/L — ABNORMAL HIGH (ref 0.0–2.0)
Acid-base deficit: 5 mmol/L — ABNORMAL HIGH (ref 0.0–2.0)
Bicarbonate: 21 mmol/L (ref 20.0–28.0)
Bicarbonate: 22.6 mmol/L (ref 20.0–28.0)
Calcium, Ion: 1.33 mmol/L (ref 1.15–1.40)
Calcium, Ion: 1.33 mmol/L (ref 1.15–1.40)
HCT: 22 % — ABNORMAL LOW (ref 39.0–52.0)
HCT: 23 % — ABNORMAL LOW (ref 39.0–52.0)
Hemoglobin: 7.5 g/dL — ABNORMAL LOW (ref 13.0–17.0)
Hemoglobin: 7.8 g/dL — ABNORMAL LOW (ref 13.0–17.0)
O2 Saturation: 94 %
O2 Saturation: 97 %
Patient temperature: 37
Patient temperature: 37.4
Potassium: 3.9 mmol/L (ref 3.5–5.1)
Potassium: 4.4 mmol/L (ref 3.5–5.1)
Sodium: 138 mmol/L (ref 135–145)
Sodium: 138 mmol/L (ref 135–145)
TCO2: 22 mmol/L (ref 22–32)
TCO2: 24 mmol/L (ref 22–32)
pCO2 arterial: 39.7 mmHg (ref 32–48)
pCO2 arterial: 41.7 mmHg (ref 32–48)
pH, Arterial: 7.31 — ABNORMAL LOW (ref 7.35–7.45)
pH, Arterial: 7.364 (ref 7.35–7.45)
pO2, Arterial: 73 mmHg — ABNORMAL LOW (ref 83–108)
pO2, Arterial: 94 mmHg (ref 83–108)

## 2022-01-24 LAB — BASIC METABOLIC PANEL
Anion gap: 5 (ref 5–15)
Anion gap: 3 — ABNORMAL LOW (ref 5–15)
BUN: 12 mg/dL (ref 8–23)
BUN: 12 mg/dL (ref 8–23)
CO2: 23 mmol/L (ref 22–32)
CO2: 26 mmol/L (ref 22–32)
Calcium: 8.5 mg/dL — ABNORMAL LOW (ref 8.9–10.3)
Calcium: 8.6 mg/dL — ABNORMAL LOW (ref 8.9–10.3)
Chloride: 109 mmol/L (ref 98–111)
Chloride: 106 mmol/L (ref 98–111)
Creatinine, Ser: 0.76 mg/dL (ref 0.61–1.24)
Creatinine, Ser: 0.77 mg/dL (ref 0.61–1.24)
GFR, Estimated: >60 mL/min (ref >60)
GFR, Estimated: >60 mL/min (ref >60)
Glucose, Bld: 146 mg/dL — ABNORMAL HIGH (ref 70–99)
Glucose, Bld: 153 mg/dL — ABNORMAL HIGH (ref 70–99)
Potassium: 4.4 mmol/L (ref 3.5–5.1)
Potassium: 4.6 mmol/L (ref 3.5–5.1)
Sodium: 137 mmol/L (ref 135–145)
Sodium: 135 mmol/L (ref 135–145)

## 2022-01-24 LAB — PREPARE FRESH FROZEN PLASMA: Unit division: 0

## 2022-01-24 LAB — HEPATIC FUNCTION PANEL
ALT: 13 U/L (ref 0–44)
AST: 41 U/L (ref 15–41)
Albumin: 3 g/dL — ABNORMAL LOW (ref 3.5–5.0)
Alkaline Phosphatase: 22 U/L — ABNORMAL LOW (ref 38–126)
Bilirubin, Direct: 0.3 mg/dL — ABNORMAL HIGH (ref 0.0–0.2)
Indirect Bilirubin: 0.9 mg/dL (ref 0.3–0.9)
Total Bilirubin: 1.2 mg/dL (ref 0.3–1.2)
Total Protein: 4.7 g/dL — ABNORMAL LOW (ref 6.5–8.1)

## 2022-01-24 LAB — APTT: aPTT: 26 seconds (ref 24–36)

## 2022-01-24 LAB — BPAM CRYOPRECIPITATE
Blood Product Expiration Date: 202305242215
Blood Product Expiration Date: 202305242215
ISSUE DATE / TIME: 202305241652
ISSUE DATE / TIME: 202305241652
Unit Type and Rh: 5100
Unit Type and Rh: 5100

## 2022-01-24 LAB — GLUCOSE, CAPILLARY
Glucose-Capillary: 131 mg/dL — ABNORMAL HIGH (ref 70–99)
Glucose-Capillary: 137 mg/dL — ABNORMAL HIGH (ref 70–99)
Glucose-Capillary: 138 mg/dL — ABNORMAL HIGH (ref 70–99)
Glucose-Capillary: 143 mg/dL — ABNORMAL HIGH (ref 70–99)
Glucose-Capillary: 147 mg/dL — ABNORMAL HIGH (ref 70–99)
Glucose-Capillary: 147 mg/dL — ABNORMAL HIGH (ref 70–99)
Glucose-Capillary: 149 mg/dL — ABNORMAL HIGH (ref 70–99)
Glucose-Capillary: 151 mg/dL — ABNORMAL HIGH (ref 70–99)
Glucose-Capillary: 152 mg/dL — ABNORMAL HIGH (ref 70–99)
Glucose-Capillary: 163 mg/dL — ABNORMAL HIGH (ref 70–99)
Glucose-Capillary: 165 mg/dL — ABNORMAL HIGH (ref 70–99)
Glucose-Capillary: 168 mg/dL — ABNORMAL HIGH (ref 70–99)
Glucose-Capillary: 169 mg/dL — ABNORMAL HIGH (ref 70–99)
Glucose-Capillary: 174 mg/dL — ABNORMAL HIGH (ref 70–99)

## 2022-01-24 LAB — COOXEMETRY PANEL
Carboxyhemoglobin: 1.9 % — ABNORMAL HIGH (ref 0.5–1.5)
Carboxyhemoglobin: 1.6 % — ABNORMAL HIGH (ref 0.5–1.5)
Methemoglobin: <0.7 % (ref 0.0–1.5)
Methemoglobin: 1.2 % (ref 0.0–1.5)
O2 Saturation: 68.9 %
O2 Saturation: 72.1 %
Total hemoglobin: 8.4 g/dL — ABNORMAL LOW (ref 12.0–16.0)
Total hemoglobin: 9.5 g/dL — ABNORMAL LOW (ref 12.0–16.0)

## 2022-01-24 LAB — PREPARE CRYOPRECIPITATE
Unit division: 0
Unit division: 0

## 2022-01-24 LAB — BPAM FFP
Blood Product Expiration Date: 202305242359
Blood Product Expiration Date: 202305242359
Blood Product Expiration Date: 202305242359
Blood Product Expiration Date: 202305242359
ISSUE DATE / TIME: 202305241332
ISSUE DATE / TIME: 202305241332
ISSUE DATE / TIME: 202305241506
ISSUE DATE / TIME: 202305241506
Unit Type and Rh: 6200
Unit Type and Rh: 6200
Unit Type and Rh: 6200
Unit Type and Rh: 6200

## 2022-01-24 LAB — BPAM PLATELET PHERESIS
Blood Product Expiration Date: 202305262359
ISSUE DATE / TIME: 202305241333
Unit Type and Rh: 7300

## 2022-01-24 LAB — MAGNESIUM
Magnesium: 2.3 mg/dL (ref 1.7–2.4)
Magnesium: 2.3 mg/dL (ref 1.7–2.4)

## 2022-01-24 LAB — CBC
HCT: 23 % — ABNORMAL LOW (ref 39.0–52.0)
HCT: 26.5 % — ABNORMAL LOW (ref 39.0–52.0)
Hemoglobin: 8.2 g/dL — ABNORMAL LOW (ref 13.0–17.0)
Hemoglobin: 9.4 g/dL — ABNORMAL LOW (ref 13.0–17.0)
MCH: 33.1 pg (ref 26.0–34.0)
MCH: 33.3 pg (ref 26.0–34.0)
MCHC: 35.7 g/dL (ref 30.0–36.0)
MCHC: 35.5 g/dL (ref 30.0–36.0)
MCV: 92.7 fL (ref 80.0–100.0)
MCV: 94 fL (ref 80.0–100.0)
Platelets: 98 10*3/uL — ABNORMAL LOW (ref 150–400)
Platelets: UNDETERMINED 10*3/uL (ref 150–400)
RBC: 2.48 MIL/uL — ABNORMAL LOW (ref 4.22–5.81)
RBC: 2.82 MIL/uL — ABNORMAL LOW (ref 4.22–5.81)
RDW: 13.2 % (ref 11.5–15.5)
RDW: 14.8 % (ref 11.5–15.5)
WBC: 13.3 10*3/uL — ABNORMAL HIGH (ref 4.0–10.5)
WBC: 16.1 10*3/uL — ABNORMAL HIGH (ref 4.0–10.5)
nRBC: 0 % (ref 0.0–0.2)
nRBC: 0 % (ref 0.0–0.2)

## 2022-01-24 LAB — PREPARE PLATELET PHERESIS: Unit division: 0

## 2022-01-24 LAB — FIBRINOGEN: Fibrinogen: 353 mg/dL (ref 210–475)

## 2022-01-24 LAB — PROTIME-INR
INR: 1.3 — ABNORMAL HIGH (ref 0.8–1.2)
Prothrombin Time: 16.1 seconds — ABNORMAL HIGH (ref 11.4–15.2)

## 2022-01-24 LAB — PREPARE RBC (CROSSMATCH)

## 2022-01-24 MED ORDER — MORPHINE SULFATE (PF) 2 MG/ML IV SOLN
1.0000 mg | INTRAVENOUS | Status: DC | PRN
  Administered 2022-01-24: 2 mg via INTRAVENOUS
  Filled 2022-01-24: qty 1

## 2022-01-24 MED ORDER — FUROSEMIDE 10 MG/ML IJ SOLN
20.0000 mg | Freq: Two times a day (BID) | INTRAMUSCULAR | Status: DC
  Administered 2022-01-24 – 2022-01-25 (×3): 20 mg via INTRAVENOUS
  Filled 2022-01-24 (×3): qty 2

## 2022-01-24 MED ORDER — ISOSORBIDE MONONITRATE ER 30 MG PO TB24
15.0000 mg | ORAL_TABLET | Freq: Every day | ORAL | Status: DC
  Administered 2022-01-24 – 2022-01-29 (×6): 15 mg via ORAL
  Filled 2022-01-24 (×6): qty 1

## 2022-01-24 MED ORDER — ENOXAPARIN SODIUM 30 MG/0.3ML IJ SOSY: 30.0000 mg | PREFILLED_SYRINGE | Freq: Every day | INTRAMUSCULAR | Status: DC

## 2022-01-24 MED ORDER — SIMETHICONE 80 MG PO CHEW
80.0000 mg | CHEWABLE_TABLET | Freq: Four times a day (QID) | ORAL | Status: DC
  Administered 2022-01-24 – 2022-01-29 (×17): 80 mg via ORAL
  Filled 2022-01-24 (×17): qty 1

## 2022-01-24 MED ORDER — NOREPINEPHRINE 4 MG/250ML-% IV SOLN
0.0000 ug/min | INTRAVENOUS | Status: DC
  Administered 2022-01-24: 2 ug/min via INTRAVENOUS

## 2022-01-24 MED ORDER — MIDAZOLAM HCL 2 MG/2ML IJ SOLN: 1.0000 mg | INTRAMUSCULAR | Status: DC | PRN

## 2022-01-24 MED ORDER — ENOXAPARIN SODIUM 40 MG/0.4ML IJ SOSY: 40.0000 mg | PREFILLED_SYRINGE | Freq: Every day | INTRAMUSCULAR | Status: DC

## 2022-01-24 MED ORDER — FE FUMARATE-B12-VIT C-FA-IFC PO CAPS
1.0000 | ORAL_CAPSULE | Freq: Two times a day (BID) | ORAL | Status: DC
  Administered 2022-01-24 – 2022-01-28 (×8): 1 via ORAL
  Filled 2022-01-24 (×8): qty 1

## 2022-01-24 MED ORDER — SODIUM CHLORIDE 0.9% IV SOLUTION: Freq: Once | INTRAVENOUS | Status: AC

## 2022-01-24 MED ORDER — ASPIRIN 325 MG PO TBEC
325.0000 mg | DELAYED_RELEASE_TABLET | Freq: Every day | ORAL | Status: DC
  Administered 2022-01-25 – 2022-01-29 (×5): 325 mg via ORAL
  Filled 2022-01-24 (×5): qty 1

## 2022-01-24 MED ORDER — INSULIN ASPART 100 UNIT/ML IJ SOLN
0.0000 [IU] | INTRAMUSCULAR | Status: DC
  Administered 2022-01-24 – 2022-01-25 (×6): 2 [IU] via SUBCUTANEOUS

## 2022-01-24 MED ORDER — FAMOTIDINE IN NACL 20-0.9 MG/50ML-% IV SOLN
INTRAVENOUS | Status: AC
  Filled 2022-01-24: qty 50

## 2022-01-24 MED ORDER — MILRINONE LACTATE IN DEXTROSE 20-5 MG/100ML-% IV SOLN
0.2500 ug/kg/min | INTRAVENOUS | Status: DC
  Administered 2022-01-24: 0.25 ug/kg/min via INTRAVENOUS
  Filled 2022-01-24: qty 100

## 2022-01-24 MED ORDER — ASPIRIN 81 MG PO CHEW
81.0000 mg | CHEWABLE_TABLET | Freq: Every day | ORAL | Status: DC
  Filled 2022-01-24: qty 1

## 2022-01-24 MED ORDER — INSULIN DETEMIR 100 UNIT/ML ~~LOC~~ SOLN
8.0000 [IU] | Freq: Two times a day (BID) | SUBCUTANEOUS | Status: DC
  Administered 2022-01-24 (×2): 8 [IU] via SUBCUTANEOUS
  Filled 2022-01-24 (×4): qty 0.08

## 2022-01-24 MED FILL — Potassium Chloride Inj 2 mEq/ML: INTRAVENOUS | Qty: 40 | Status: AC

## 2022-01-24 MED FILL — Magnesium Sulfate Inj 50%: INTRAMUSCULAR | Qty: 10 | Status: AC

## 2022-01-24 MED FILL — Heparin Sodium (Porcine) Inj 1000 Unit/ML: Qty: 1000 | Status: AC

## 2022-01-24 NOTE — TOC Benefit Eligibility Note (Signed)
Patient Product/process development scientist completed.    The patient is currently admitted and upon discharge could be taking Entresto 24-26 mg.  The current 30 day co-pay is, $37.00.   The patient is currently admitted and upon discharge could be taking Farxiga 10 mg.  The current 30 day co-pay is, $37.00.   The patient is currently admitted and upon discharge could be taking Jardiance 10 mg.  The current 30 day co-pay is, $37.00.   The patient is insured through [facility] Medicare Part D     Roland Earl, CPhT Pharmacy Patient Advocate Specialist [facility] Patient Advocate Team Direct Number: [phone]  Fax: [phone]

## 2022-01-24 NOTE — Progress Notes (Addendum)
TCTS DAILY ICU PROGRESS NOTE                   [address]            [place] [address]          [phone]   1 Day Post-Op Procedure(s) (LRB): CORONARY ARTERY BYPASS GRAFTING (CABG) X4 USING LEFT INTERNAL MAMMARY ARTERY, LEFT RADIAL ARTERY, AND RIGHT GREATER SAPHENOUS VEIN HARVESTED ENDOSCOPICALLY (N/A) RADIAL ARTERY HARVEST (Left) TRANSESOPHAGEAL ECHOCARDIOGRAM (TEE) (N/A)  Total Length of Stay:  LOS: 1 day   Subjective: Patient awake and alert this am. He is not having much incisional pain and denies nausea.  Objective: Vital signs in last 24 hours: Temp:  [96.8 F (36 C)-99.5 F (37.5 C)] 99.1 F (37.3 C) (05/25 0700) Pulse Rate:  [86-103] 92 (05/25 0700) Cardiac Rhythm: Normal sinus rhythm (05/24 2247) Resp:  [0-35] 0 (05/25 0700) BP: (91-144)/(43-109) 119/65 (05/25 0700) SpO2:  [94 %-100 %] 97 % (05/25 0700) Arterial Line BP: (82-146)/(34-63) 113/44 (05/25 0700) FiO2 (%):  [40 %-50 %] 40 % (05/24 2220) Weight:  [84.5 kg] 84.5 kg (05/25 0500)  Filed Weights   01/23/22 0623 01/24/22 0500  Weight: 76.2 kg 84.5 kg    Weight change: 8.296 kg   Hemodynamic parameters for last 24 hours: PAP: (16-31)/(1-19) 27/15 CO:  [6.2 L/min-8.8 L/min] 8.8 L/min CI:  [3.2 L/min/m2-4.6 L/min/m2] 4.6 L/min/m2  Intake/Output from previous day: 05/24 0701 - 05/25 0700 In: 8132.6 [I.V.:4847.1; Blood:2061; IV Piggyback:1224.6] Out: [id number] [Urine:2140; Chest Tube:980]  Intake/Output this shift: No intake/output data recorded.  Current Meds: Scheduled Meds:  sodium chloride   Intravenous Once   acetaminophen  1,000 mg Oral Q6H   Or   acetaminophen (TYLENOL) oral liquid 160 mg/5 mL  1,000 mg Per Tube Q6H   aspirin EC  325 mg Oral Daily   Or   aspirin  324 mg Per Tube Daily   bisacodyl  10 mg Oral Daily   Or   bisacodyl  10 mg Rectal Daily   Chlorhexidine Gluconate Cloth  6 each Topical Daily   docusate sodium  200 mg Oral Daily   mouth rinse  15 mL Mouth  Rinse BID   [START ON 01/25/2022] pantoprazole  40 mg Oral Daily   rosuvastatin  10 mg Oral Daily   sodium chloride flush  10-40 mL Intracatheter Q12H   sodium chloride flush  3 mL Intravenous Q12H   Continuous Infusions:  sodium chloride 10 mL/hr at 01/24/22 0700   sodium chloride     sodium chloride 20 mL/hr at 01/23/22 1641   albumin human 12.5 g (01/23/22 1858)    ceFAZolin (ANCEF) IV Stopped (01/24/22 0537)   dexmedetomidine (PRECEDEX) IV infusion Stopped (01/23/22 2328)   epinephrine 3 mcg/min (01/24/22 0700)   insulin 1.9 Units/hr (01/24/22 0700)   lactated ringers     lactated ringers 20 mL/hr at 01/24/22 0700   milrinone 0.3 mcg/kg/min (01/24/22 0700)   nitroGLYCERIN 7 mcg/min (01/24/22 0700)   norepinephrine (LEVOPHED) Adult infusion 6 mcg/min (01/24/22 0300)   vancomycin Stopped (01/23/22 2103)   PRN Meds:.sodium chloride, albumin human, dextrose, metoprolol tartrate, midazolam, morphine injection, ondansetron (ZOFRAN) IV, oxyCODONE, sodium chloride flush, sodium chloride flush, traMADol  General appearance: alert, cooperative, and no distress Neurologic: intact Heart: RRR, rub with chest tubes in place Lungs: Diminished bibasilar breath sounds Abdomen: Soft, non tender, sporadic bowel sounds  Extremities: SCDs in place. Motor/sensory intact LUE Wound: Aquacel intact. RLE upper  wound clean and dry;remaining dressings intact (SCD over top) . LUE dressing with mild bloody ooze  Lab Results: CBC: Recent Labs    01/23/22 2105 01/23/22 2246 01/24/22 0451 01/24/22 0453  WBC 16.3*  --  13.3*  --   HGB 8.7*   < > 8.2* 7.5*  HCT 25.1*   < > 23.0* 22.0*  PLT 101*  --  98*  --    < > = values in this interval not displayed.   BMET:  Recent Labs    01/23/22 2151 01/23/22 2246 01/24/22 0451 01/24/22 0453  NA 136   < > 137 138  K 3.7   < > 4.4 4.4  CL 109  --  109  --   CO2 21*  --  23  --   GLUCOSE 204*  --  146*  --   BUN 11  --  12  --   CREATININE 0.81  --   0.76  --   CALCIUM 8.1*  --  8.5*  --    < > = values in this interval not displayed.    CMET: Lab Results  Component Value Date   WBC 13.3 (H) 01/24/2022   HGB 7.5 (L) 01/24/2022   HCT 22.0 (L) 01/24/2022   PLT 98 (L) 01/24/2022   GLUCOSE 146 (H) 01/24/2022   ALT 13 01/24/2022   AST 41 01/24/2022   NA 138 01/24/2022   K 4.4 01/24/2022   CL 109 01/24/2022   CREATININE 0.76 01/24/2022   BUN 12 01/24/2022   CO2 23 01/24/2022   INR 1.5 (H) 01/23/2022   HGBA1C 5.3 01/21/2022      PT/INR:  Recent Labs    01/23/22 1631  LABPROT 17.5*  INR 1.5*   Radiology: [facility] Chest Port 1 View  Result Date: 01/23/2022 CLINICAL DATA:  Post CABG EXAM: PORTABLE CHEST 1 VIEW COMPARISON:  Portable exam 1614 hours compared to 01/21/2022 FINDINGS: Tip of endotracheal tube projects 7.1 cm above carina. Tip of nasogastric tube projects over mid esophagus; recommend advancing tube 17 cm to place proximal side-port within stomach. BILATERAL thoracostomy tubes present. RIGHT jugular Swan-Ganz catheter with tip projecting over proximal RIGHT pulmonary artery. Enlargement of cardiac silhouette post CABG with epicardial pacing wires noted. Pulmonary vascular congestion and mild perihilar edema bilaterally. No pleural effusion or pneumothorax. IMPRESSION: Expected postoperative changes with mild perihilar edema. Recommend advancing nasogastric tube 17 cm to place tip in the stomach. Electronically Signed   By: Lavonia Dana M.D.   On: 01/23/2022 16:31   ECHO INTRAOPERATIVE TEE  Result Date: 01/23/2022  *INTRAOPERATIVE TRANSESOPHAGEAL REPORT *  Patient Name:   Roger Smith  Date of Exam: 01/23/2022 Medical Rec #:  [id number]      Height:       69.0 in Accession #:    [id number]     Weight:       168.0 lb Date of Birth:  04-04-1941      BSA:          1.92 m Patient Age:    81 years       BP:           144/90 mmHg Patient Gender: M              HR:           85 bpm. Exam Location:  Anesthesiology Transesophogeal exam was  perform intraoperatively during surgical procedure. Patient was closely monitored under general anesthesia during the entirety of examination.  Indications:     Coronary artery disease of native artery of native heart with                  stable angina pectoris [facility]) [I25.118] Performing Phys: [place] Isabela Nardelli Diagnosing Phys: Roderic Palau MD Complications: No known complications during this procedure. POST-OP IMPRESSIONS Overall, there were no significant changes from pre-bypass. PRE-OP FINDINGS  Left Ventricle: The left ventricle has severely reduced systolic function, with an ejection fraction of 20-30%. The cavity size was mildly dilated. There is no increase in left ventricular wall thickness. Left ventricular diffuse hypokinesis. There is no left ventricular hypertrophy. Right Ventricle: The right ventricle has normal systolic function. The cavity was normal. There is no increase in right ventricular wall thickness. Left Atrium: Left atrial size was not assessed. No left atrial/left atrial appendage thrombus was detected. Right Atrium: Right atrial size was not assessed. Interatrial Septum: No atrial level shunt detected by color flow Doppler. Pericardium: There is no evidence of pericardial effusion. Mitral Valve: The mitral valve is normal in structure. Mitral valve regurgitation is trivial by color flow Doppler. The MR jet is centrally-directed. Tricuspid Valve: The tricuspid valve was normal in structure. Tricuspid valve regurgitation is mild by color flow Doppler. Aortic Valve: The aortic valve is tricuspid Aortic valve regurgitation is trivial by color flow Doppler. Pulmonic Valve: The pulmonic valve was normal in structure. Pulmonic valve regurgitation is trivial by color flow Doppler. +--------------+-------++ LEFT VENTRICLE        +--------------+-------++ PLAX 2D               +--------------+-------++ LVIDd:        5.90 cm +--------------+-------++ LVIDs:        5.10 cm  +--------------+-------++ LV SV:        49 ml   +--------------+-------++ LV SV Index:  25.54   +--------------+-------++                       +--------------+-------++  Roderic Palau MD Electronically signed by Roderic Palau MD Signature Date/Time: 01/23/2022/4:28:13 PM    Final      Assessment/Plan: S/P Procedure(s) (LRB): CORONARY ARTERY BYPASS GRAFTING (CABG) X4 USING LEFT INTERNAL MAMMARY ARTERY, LEFT RADIAL ARTERY, AND RIGHT GREATER SAPHENOUS VEIN HARVESTED ENDOSCOPICALLY (N/A) RADIAL ARTERY HARVEST (Left) TRANSESOPHAGEAL ECHOCARDIOGRAM (TEE) (N/A) CV-CO/CI 8.8/4.6.Back up VVI at 6. SR this am. On Nitro, Epi, Nor epinephrine, and Milrinone drips. Co ox this am 68.9. Will stop Nitro and start Imdur 15 mg daily for radial artery harvest. Dr. Prescott Gum to determine order of weaning remaining drips. Pulmonary-on 3 liters of oxygen via [place]. Chest tubes with 980 cc since surgery. CXR this am appears to show bibasilar breath sounds and small pleural effusions. Encourage incentive spirometer  Volume overload-will diurese once off pressors Expected post op blood loss anemia-H and H this am stable at 8.2 and 23. He is being transfused this am. CBGs 147/151/137. Pre op HGA1C 5.3. Transition off Insulin drip. Will stop accu checks and SS PRN after transfer to floor 6. Thrombocytopenia-platelets this am 98,000. Monitor 7. Lovenox for DVT prophylaxis 8. Please see progression orders   Donielle Liston Alba PA-C 01/24/2022 7:01 AM   Stable hemodynamics, nsr with PVCs Will start bid lasix, wean norepi as tol to keep MAP 65-70 mm Hg L hand with normal mtor function DC swan and mobilize- follow cvp/ coox  patient examined and medical record reviewed,agree with above note. Dahlia Byes 01/24/2022

## 2022-01-24 NOTE — Op Note (Signed)
NAMEWENDY, Roger Smith MEDICAL RECORD NO: [id number] ACCOUNT NO: [id number] DATE OF BIRTH: 1940/12/16 FACILITY: [facility] LOCATION: [facility] PHYSICIAN: Ivin Poot III, MD  Operative Report   DATE OF PROCEDURE: 01/23/2022  PROCEDURES PERFORMED: 1.  Coronary artery bypass grafting x 4 (left internal mammary artery to LAD, left radial artery free graft to OM1, saphenous vein graft to diagonal, saphenous vein graft to posterior descending). 2.  Open harvest of the left radial artery. 3.  Endoscopic harvest of the right leg greater saphenous vein.  PREOPERATIVE DIAGNOSES: 1.  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 2.  History of syncope. 3.  History of hypertension and dyslipidemia.  POSTOPERATIVE DIAGNOSES: 1.  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 2.  History of syncope. 3.  History of hypertension and dyslipidemia.  SURGEON:  Len Childs, MD  ASSISTANT:  Scheryl Darter, PA-C.  A surgical first assistant was required for the surgery due to the complexity of the procedure and the standard of care for this cardiac surgical procedure at this institution.  The surgical first assistant was needed to endoscopically harvest the saphenous vein  for conduit and close the leg incision as well as to harvest the radial artery graft for use as a coronary conduit.  The surgical first assistant was also required to assist with the distal anastomosis for suture management, suctioning, exposure, and  general assistance.  ANESTHESIA:  General by Dr. Suann Larry.  CLINICAL NOTE:  The patient is a 81 year old hypertensive male who sustained a syncopal episode while standing at church.  He did not sustain any injuries.  He was evaluated and found to have no evidence of stroke or cerebrovascular significant disease.   He underwent Cardiology evaluation and cardiac catheterization demonstrated  severe 3-vessel coronary artery disease with chronic occlusion of the LAD, 95% stenosis of the RCA and 90% stenosis of the circumflex.  His ejection fraction was reduced at  catheterization with LVEDP of 25.  Subsequent echo showed EF to be less than 20%, but no significant valvular disease.  Based on his severe 3-vessel coronary artery disease with LV dysfunction, he was felt to be a candidate for surgical revascularization  at increased risk.  I reviewed the above studies and met with the patient and his daughter in the office and discussed the expected benefits of coronary artery bypass surgery as well as the potential risks.  He understood the risks to be improved  long-term survival and preservation of LV function.  He understood the risks to include stroke, bleeding, blood transfusion, postoperative organ failure, infection and sepsis, arrhythmias, death.  He agreed to proceed with surgery at increased risk under  what I felt was an informed consent.  FINDINGS:  1.  The patient has a history of vein stripping in his left leg and varicose veins.  A segment of vein was found in the right leg, which was adequate, but had some varicosities, which needed to be repaired. 2.  Chronically occluded atretic distal LAD, but graftable. 3.  Significant improvement in global LV function after grafting by TEE, after separation from cardiopulmonary bypass.  DESCRIPTION OF PROCEDURE:  The patient was brought from preoperative holding where informed consent was documented and final issues were addressed with the patient.  He was brought directly to the operating room and underwent general anesthesia under  invasive hemodynamic monitoring and remained stable.  The chest, left arm and legs were prepped and draped  as a sterile field.  A proper timeout was performed.  A sternal incision was made and the left internal mammary artery was harvested as a pedicle  graft from its origin at the subclavian vessels.  It was 1.4  mm vessel with satisfactory flow.  The left radial artery was harvested using an open technique and was flushed with Papaverine and cannulated for use as a graft to the circumflex.  The arm was closed in a standard fashion and retucked to the patient's side.  Prior to removing the radial  artery, good flow to the hand was documented after clamping the radial artery proximal to the left wrist.  Saphenous vein was then harvested endoscopically from the right leg and the vein was repaired for varicosities as noted.  The sternal retractor was placed and the pericardium was suspended.  Pursestrings were placed in the ascending aorta and right atrium and heparin was administered.  When the ACT was documented as being therapeutic, the patient was cannulated and placed  on bypass.  The coronaries were identified for grafting and the mammary artery, radial artery and vein grafts were prepared for the distal anastomosis.  Cardioplegia cannulas were placed both antegrade and retrograde cold blood cardioplegia.  The patient  was cooled to 32 degrees.  The aortic crossclamp was applied and 1 liter of cold blood cardioplegia was delivered in split doses between the antegrade aortic and retrograde coronary sinus catheters.  There was good cardioplegic arrest and supple  temperature dropped less than 12 degrees.  Cardioplegia was delivered every 20 minutes.  The distal coronary anastomoses were performed.  The first distal anastomosis was the posterior descending branch of the RCA.  There was a 95% proximal stenosis.  A reverse saphenous vein was sewn end-to-side with 7-0 Prolene with good flow through the  graft.  Cardioplegia was redosed.  The second distal anastomosis was to the circumflex marginal branch of the left coronary.  There was a proximal 90% stenosis.  The left radial artery was then sewn end-to-side with running 8-0 Prolene and there was good flow through the graft.   Cardioplegia was redosed.  The  third distal anastomosis was to the diagonal branch of the LAD.  There was a proximal 90% stenosis.  This was a 1.4 mm vessel.  A reverse saphenous vein was sewn end-to-side with running 7-0 Prolene.  There was good flow through the graft.   Cardioplegia was redosed.  The fourth distal anastomosis was the distal LAD, which is totally occluded proximally.  The LAD was small being approximately 1.2 mm.  The left IMA pedicle was brought through an opening and the left lateral pericardium was brought down onto the LAD and  sewn end-to-side with running 8-0 Prolene.  There was good flow through the anastomosis after briefly releasing the pedicle bulldog and the mammary artery.  The bulldog was reapplied and the pedicle secured to epicardium.  Cardioplegia was redosed.  While the crossclamp was in place, the two vein grafts were anastomosed to the ascending aorta using a 4.5 mm punch and running 6-0 Prolene.  Prior to removing the crossclamp, air was vented from the coronaries with a dose of retrograde warm blood  cardioplegia and the usual de-airing maneuvers.  The crossclamp was removed.  The vein grafts were opened and deaired.  The radial artery graft was then sewn to the hood of the vein graft to the diagonal for its proximal anastomosis using running 7-0 Prolene.  The bulldogs were all removed  and the grafts were all opened and  perfused.  Hemostasis was documented at the proximal and distal anastomoses.  The patient was rewarmed and reperfused.  Temporary pacing wires were applied.  The heart had restored to a regular rhythm.  Lungs were expanded.  Ventilator was resumed.  The patient was started on low dose inotropes including milrinone and low dose epinephrine.  The patient weaned off cardiopulmonary bypass without difficulty.  There was significant improvement in LV global  function.  Cardiac output was over 6 liters per minute.  Protamine was administered without adverse reaction.  There was still  considerable coagulopathy and poor hemostasis.  The patient received FFP and platelets with improvement and after the  fibrinogen level came back low at 145, cryoprecipitate was administered as well.  The superior pericardial fat was closed over the aorta and vein grafts.  Anterior mediastinal and bilateral pleural tubes were placed and brought out through separate incisions.  The sternum was closed with wire.  The patient remained stable.  The  pectoralis fascia was closed with a running #1 Vicryl.  Subcutaneous and skin layers were closed with running Vicryl and sterile dressings were applied.  A chest x-ray was taken in the operating room showing no pneumothorax with the lines in good  position.  The patient was then transported back to the ICU.  Total cardiopulmonary bypass time 145 minutes.   MUK D: 01/23/2022 7:09:25 pm T: 01/24/2022 1:02:00 am  JOB: [id number]/ [id number]

## 2022-01-24 NOTE — Progress Notes (Signed)
CT surgery PM rounds  Patient had stable day and was able to ambulate in the hallway with assistance. Norepinephrine weaned off, low-dose epinephrine 3 mcg continuing for preoperative severe LV dysfunction Urine output adequate Serosanguineous drainage from chest tubes continue.  Repeat coags are within parameters except for low platelet count.  1 unit of platelets to be administered this p.m. Follow-up CBC after 1 unit of packed cells earlier this a.m. shows stable hemoglobin 9.4 P.m. Co. ox greater than 70%, maintaining normal sinus rhythm  Blood pressure 132/65, pulse 92, temperature 98.2 F (36.8 C), temperature source Oral, resp. rate 20, height 5\' 9"  (1.753 m), weight 84.5 kg, SpO2 93 %.

## 2022-01-25 ENCOUNTER — Inpatient Hospital Stay (HOSPITAL_COMMUNITY): Payer: Medicare Other

## 2022-01-25 LAB — LIPID PANEL
Cholesterol: 64 mg/dL (ref 0–200)
HDL: 34 mg/dL — ABNORMAL LOW (ref >40)
LDL Cholesterol: 25 mg/dL (ref 0–99)
Total CHOL/HDL Ratio: 1.9 RATIO
Triglycerides: 27 mg/dL (ref <150)
VLDL: 5 mg/dL (ref 0–40)

## 2022-01-25 LAB — CBC
HCT: 25.5 % — ABNORMAL LOW (ref 39.0–52.0)
Hemoglobin: 8.5 g/dL — ABNORMAL LOW (ref 13.0–17.0)
MCH: 32.1 pg (ref 26.0–34.0)
MCHC: 33.3 g/dL (ref 30.0–36.0)
MCV: 96.2 fL (ref 80.0–100.0)
Platelets: 93 10*3/uL — ABNORMAL LOW (ref 150–400)
RBC: 2.65 MIL/uL — ABNORMAL LOW (ref 4.22–5.81)
RDW: 15.2 % (ref 11.5–15.5)
WBC: 15 10*3/uL — ABNORMAL HIGH (ref 4.0–10.5)
nRBC: 0 % (ref 0.0–0.2)

## 2022-01-25 LAB — BPAM PLATELET PHERESIS
Blood Product Expiration Date: 202305262359
ISSUE DATE / TIME: 202305251802
Unit Type and Rh: 6200

## 2022-01-25 LAB — COOXEMETRY PANEL
Carboxyhemoglobin: 1.7 % — ABNORMAL HIGH (ref 0.5–1.5)
Methemoglobin: 1.2 % (ref 0.0–1.5)
O2 Saturation: 77 %
Total hemoglobin: 8.7 g/dL — ABNORMAL LOW (ref 12.0–16.0)

## 2022-01-25 LAB — BASIC METABOLIC PANEL
Anion gap: 4 — ABNORMAL LOW (ref 5–15)
BUN: 14 mg/dL (ref 8–23)
CO2: 26 mmol/L (ref 22–32)
Calcium: 9 mg/dL (ref 8.9–10.3)
Chloride: 105 mmol/L (ref 98–111)
Creatinine, Ser: 0.71 mg/dL (ref 0.61–1.24)
GFR, Estimated: >60 mL/min (ref >60)
Glucose, Bld: 129 mg/dL — ABNORMAL HIGH (ref 70–99)
Potassium: 4.4 mmol/L (ref 3.5–5.1)
Sodium: 135 mmol/L (ref 135–145)

## 2022-01-25 LAB — GLUCOSE, CAPILLARY
Glucose-Capillary: 128 mg/dL — ABNORMAL HIGH (ref 70–99)
Glucose-Capillary: 126 mg/dL — ABNORMAL HIGH (ref 70–99)
Glucose-Capillary: 122 mg/dL — ABNORMAL HIGH (ref 70–99)
Glucose-Capillary: 104 mg/dL — ABNORMAL HIGH (ref 70–99)
Glucose-Capillary: 111 mg/dL — ABNORMAL HIGH (ref 70–99)

## 2022-01-25 LAB — PREPARE PLATELET PHERESIS: Unit division: 0

## 2022-01-25 MED ORDER — CARVEDILOL 3.125 MG PO TABS
3.1250 mg | ORAL_TABLET | Freq: Two times a day (BID) | ORAL | Status: DC
  Administered 2022-01-25 – 2022-01-28 (×7): 3.125 mg via ORAL
  Filled 2022-01-25 (×7): qty 1

## 2022-01-25 MED ORDER — OXYCODONE HCL 5 MG PO TABS: 5.0000 mg | ORAL_TABLET | ORAL | Status: DC | PRN

## 2022-01-25 MED ORDER — SODIUM CHLORIDE 0.9% FLUSH: 10.0000 mL | INTRAVENOUS | Status: DC | PRN

## 2022-01-25 MED ORDER — SORBITOL 70 % SOLN
30.0000 mL | Freq: Every day | Status: AC
  Administered 2022-01-25 – 2022-01-26 (×2): 30 mL via ORAL
  Filled 2022-01-25 (×2): qty 30

## 2022-01-25 MED ORDER — FUROSEMIDE 10 MG/ML IJ SOLN
40.0000 mg | Freq: Two times a day (BID) | INTRAMUSCULAR | Status: DC
  Administered 2022-01-25 – 2022-01-27 (×4): 40 mg via INTRAVENOUS
  Filled 2022-01-25 (×4): qty 4

## 2022-01-25 MED ORDER — METOCLOPRAMIDE HCL 5 MG/ML IJ SOLN
10.0000 mg | Freq: Four times a day (QID) | INTRAMUSCULAR | Status: DC
  Administered 2022-01-25 (×2): 10 mg via INTRAVENOUS
  Filled 2022-01-25 (×2): qty 2

## 2022-01-25 MED ORDER — METOLAZONE 5 MG PO TABS
5.0000 mg | ORAL_TABLET | Freq: Every day | ORAL | Status: AC
  Administered 2022-01-25 – 2022-01-27 (×3): 5 mg via ORAL
  Filled 2022-01-25 (×3): qty 1

## 2022-01-25 MED ORDER — MORPHINE SULFATE (PF) 2 MG/ML IV SOLN: 2.0000 mg | INTRAVENOUS | Status: DC | PRN

## 2022-01-25 MED ORDER — MILRINONE LACTATE IN DEXTROSE 20-5 MG/100ML-% IV SOLN
0.1250 ug/kg/min | INTRAVENOUS | Status: DC
  Administered 2022-01-25: 0.125 ug/kg/min via INTRAVENOUS
  Filled 2022-01-25: qty 100

## 2022-01-25 MED ORDER — INSULIN ASPART 100 UNIT/ML IJ SOLN
0.0000 [IU] | Freq: Three times a day (TID) | INTRAMUSCULAR | Status: DC
  Administered 2022-01-25 – 2022-01-27 (×6): 2 [IU] via SUBCUTANEOUS

## 2022-01-25 MED ORDER — EPINEPHRINE HCL 5 MG/250ML IV SOLN IN NS: 1.0000 ug/min | INTRAVENOUS | Status: DC

## 2022-01-25 MED ORDER — TEMAZEPAM 15 MG PO CAPS
15.0000 mg | ORAL_CAPSULE | Freq: Every day | ORAL | Status: DC
  Administered 2022-01-25 – 2022-01-28 (×4): 15 mg via ORAL
  Filled 2022-01-25 (×4): qty 1

## 2022-01-25 MED ORDER — SODIUM CHLORIDE 0.9% FLUSH
10.0000 mL | Freq: Two times a day (BID) | INTRAVENOUS | Status: DC
  Administered 2022-01-26 – 2022-01-27 (×2): 10 mL

## 2022-01-25 MED FILL — Lidocaine HCl Local Soln Prefilled Syringe 100 MG/5ML (2%): INTRAMUSCULAR | Qty: 10 | Status: AC

## 2022-01-25 MED FILL — Sodium Chloride IV Soln 0.9%: INTRAVENOUS | Qty: 2000 | Status: AC

## 2022-01-25 MED FILL — Albumin, Human Inj 5%: INTRAVENOUS | Qty: 250 | Status: AC

## 2022-01-25 MED FILL — Heparin Sodium (Porcine) Inj 1000 Unit/ML: INTRAMUSCULAR | Qty: 20 | Status: AC

## 2022-01-25 MED FILL — Mannitol IV Soln 20%: INTRAVENOUS | Qty: 500 | Status: AC

## 2022-01-25 MED FILL — Calcium Chloride Inj 10%: INTRAVENOUS | Qty: 10 | Status: AC

## 2022-01-25 MED FILL — Electrolyte-R (PH 7.4) Solution: INTRAVENOUS | Qty: 6000 | Status: AC

## 2022-01-25 NOTE — Progress Notes (Addendum)
TCTS DAILY ICU PROGRESS NOTE                   [address]            [place] [address]          [phone]   2 Days Post-Op Procedure(s) (LRB): CORONARY ARTERY BYPASS GRAFTING (CABG) X4 USING LEFT INTERNAL MAMMARY ARTERY, LEFT RADIAL ARTERY, AND RIGHT GREATER SAPHENOUS VEIN HARVESTED ENDOSCOPICALLY (N/A) RADIAL ARTERY HARVEST (Left) TRANSESOPHAGEAL ECHOCARDIOGRAM (TEE) (N/A)  Total Length of Stay:  LOS: 2 days   Subjective: Patient sitting in chair this am. He has complaints of nausea but is trying to eat breakfast. He already walked this am.  Objective: Vital signs in last 24 hours: Temp:  [97.4 F (36.3 C)-99.3 F (37.4 C)] 98.4 F (36.9 C) (05/26 0500) Pulse Rate:  [78-102] 82 (05/26 0500) Cardiac Rhythm: Normal sinus rhythm (05/26 0100) Resp:  [9-27] 17 (05/26 0500) BP: (108-151)/(51-72) 148/62 (05/26 0500) SpO2:  [88 %-99 %] 98 % (05/26 0500) Arterial Line BP: (117-134)/(41-47) 131/41 (05/25 1000) Weight:  [84.2 kg] 84.2 kg (05/26 0500)  Filed Weights   01/23/22 0623 01/24/22 0500 01/25/22 0500  Weight: 76.2 kg 84.5 kg 84.2 kg    Weight change: -0.312 kg   Hemodynamic parameters for last 24 hours: PAP: (30-35)/(14-17) 35/14 CVP:  [5 mmHg-31 mmHg] 10 mmHg  Intake/Output from previous day: 05/25 0701 - 05/26 0700 In: 2322.9 [P.O.:600; I.V.:1023.8; Blood:499; IV Piggyback:200.1] Out: 2350 [Urine:1430; Chest Tube:920]  Intake/Output this shift: No intake/output data recorded.  Current Meds: Scheduled Meds:  acetaminophen  1,000 mg Oral Q6H   Or   acetaminophen (TYLENOL) oral liquid 160 mg/5 mL  1,000 mg Per Tube Q6H   aspirin EC  325 mg Oral Daily   Or   aspirin  81 mg Per Tube Daily   bisacodyl  10 mg Oral Daily   Or   bisacodyl  10 mg Rectal Daily   Chlorhexidine Gluconate Cloth  6 each Topical Daily   docusate sodium  200 mg Oral Daily   ferrous [id number] C-folic acid  1 capsule Oral BID PC   furosemide  20 mg  Intravenous BID   insulin aspart  0-24 Units Subcutaneous Q4H   insulin detemir  8 Units Subcutaneous BID   isosorbide mononitrate  15 mg Oral Daily   mouth rinse  15 mL Mouth Rinse BID   pantoprazole  40 mg Oral Daily   rosuvastatin  10 mg Oral Daily   simethicone  80 mg Oral QID   sodium chloride flush  10-40 mL Intracatheter Q12H   sodium chloride flush  3 mL Intravenous Q12H   Continuous Infusions:  sodium chloride Stopped (01/24/22 1024)   sodium chloride     sodium chloride 20 mL/hr at 01/23/22 1641    ceFAZolin (ANCEF) IV 2 g (01/25/22 [id number])   epinephrine 2 mcg/min (01/25/22 0600)   lactated ringers     lactated ringers 20 mL/hr at 01/25/22 0600   milrinone 0.25 mcg/kg/min (01/25/22 0600)   norepinephrine (LEVOPHED) Adult infusion Stopped (01/24/22 1000)   PRN Meds:.sodium chloride, midazolam, morphine injection, ondansetron (ZOFRAN) IV, oxyCODONE, sodium chloride flush, sodium chloride flush, traMADol  General appearance: alert, cooperative, and no distress Neurologic: intact Heart: RRR Lungs: Diminished bibasilar breath sounds Abdomen: Soft, non tender, sporadic bowel sounds  Extremities: SCDs in place. Motor/sensory intact LUE Wound: Aquacel intact. RLE upper wound clean and dry;remaining dressings intact (SCD over top). LUE dressing with  mild bloody ooze  Lab Results: CBC: Recent Labs    01/24/22 1556 01/25/22 0439  WBC 16.1* 15.0*  HGB 9.4* 8.5*  HCT 26.5* 25.5*  PLT PLATELET CLUMPS NOTED ON SMEAR, UNABLE TO ESTIMATE 93*    BMET:  Recent Labs    01/24/22 1539 01/25/22 0439  NA 135 135  K 4.6 4.4  CL 106 105  CO2 26 26  GLUCOSE 153* 129*  BUN 12 14  CREATININE 0.77 0.71  CALCIUM 8.6* 9.0     CMET: Lab Results  Component Value Date   WBC 15.0 (H) 01/25/2022   HGB 8.5 (L) 01/25/2022   HCT 25.5 (L) 01/25/2022   PLT 93 (L) 01/25/2022   GLUCOSE 129 (H) 01/25/2022   CHOL 64 01/25/2022   TRIG 27 01/25/2022   HDL 34 (L) 01/25/2022   LDLCALC 25  01/25/2022   ALT 13 01/24/2022   AST 41 01/24/2022   NA 135 01/25/2022   K 4.4 01/25/2022   CL 105 01/25/2022   CREATININE 0.71 01/25/2022   BUN 14 01/25/2022   CO2 26 01/25/2022   INR 1.3 (H) 01/24/2022   HGBA1C 5.3 01/21/2022      PT/INR:  Recent Labs    01/24/22 1539  LABPROT 16.1*  INR 1.3*    Radiology: No results found.   Assessment/Plan: S/P Procedure(s) (LRB): CORONARY ARTERY BYPASS GRAFTING (CABG) X4 USING LEFT INTERNAL MAMMARY ARTERY, LEFT RADIAL ARTERY, AND RIGHT GREATER SAPHENOUS VEIN HARVESTED ENDOSCOPICALLY (N/A) RADIAL ARTERY HARVEST (Left) TRANSESOPHAGEAL ECHOCARDIOGRAM (TEE) (N/A) CV-SR, PVCs this am. On Epi and Milrinone drips. Co ox this am up to 77.  On Imdur 15 mg daily for radial artery harvest.  Pulmonary-on 3 liters of oxygen via [place]. Wean as able over next few days. Chest tubes with 920 cc (390 cc last 12 hours)  since surgery. CXR this am appears stable( shows no pneumothorax, bibasilar breath sounds and small pleural effusions). Chest tubes to remain for now. Encourage incentive spirometer and flutter valve Volume overload-on Lasix 20 mg IV bid. CVP 10-12 Expected post op blood loss anemia-H and H this am stable at 8.5 and 25.5. Continue Trinisicon CBGs 138/147/128. Pre op HGA1C 5.3. Transition off Insulin drip. Will stop accu checks and SS PRN after transfer to floor 6. Thrombocytopenia-platelets this am 93,000. On Lovenox and ec asa. Monitor 7. Lovenox for DVT prophylaxis 8. GI-No abdominal pain. Zofran PRN and will add scheduled Reglan    Donielle Liston Alba PA-C 01/25/2022 7:01 AM  CABGx4 ischemic cardiomyopathy  Hemodynamics stable- nsr, norepi and epi weaned of, milrinone down to .125 mcg. Coox> .70  Volume overloaded wt up > 10 lbs, lasix dose increased and metolazone added. CXR ok but sig serosanguinous drainage persists from drains.. Patient had sig coagulopathyand persistent low plt count- holding lovenox, heparin; asa is low  dose.  Patient walking in halls and should be readty for transfer after milrinone is off, chest tube output decreased  patient examined and medical record reviewed,agree with above note. Dahlia Byes 01/25/2022

## 2022-01-25 NOTE — Progress Notes (Deleted)
This nurse returned to patient's room. Secure chat sent to nurse to reconsult when patient is back in bed or when CVC line is ready for dc'd. Fran Lowes, RN VAST

## 2022-01-25 NOTE — Progress Notes (Signed)
Returned to patient's room. Secure chat sent to nurse to reconsult when patient is back in bed or when CVC line is ready for dc'd. Barabara Motz, RN VAST 

## 2022-01-25 NOTE — Progress Notes (Deleted)
Returned to patient's room. Secure chat sent to nurse to reconsult when patient is back in bed or when CVC line is ready for dc'd. Tomasita Morrow, RN VAST

## 2022-01-25 NOTE — Progress Notes (Signed)
EVENING ROUNDS NOTE :     [address]       [place] [address]             [phone]                 2 Days Post-Op Procedure(s) (LRB): CORONARY ARTERY BYPASS GRAFTING (CABG) X4 USING LEFT INTERNAL MAMMARY ARTERY, LEFT RADIAL ARTERY, AND RIGHT GREATER SAPHENOUS VEIN HARVESTED ENDOSCOPICALLY (N/A) RADIAL ARTERY HARVEST (Left) TRANSESOPHAGEAL ECHOCARDIOGRAM (TEE) (N/A)   Total Length of Stay:  LOS: 2 days  Events:   No events Comfortable in bed    BP 120/76   Pulse 83   Temp 98.6 F (37 C)   Resp (!) 22   Ht 5\' 9"  (1.753 m)   Wt 84.2 kg   SpO2 97%   BMI 27.41 kg/m   CVP:  [7 mmHg-30 mmHg] 11 mmHg      sodium chloride Stopped (01/24/22 1024)   sodium chloride     sodium chloride 20 mL/hr at 01/23/22 1641   lactated ringers Stopped (01/25/22 0928)   milrinone 0.125 mcg/kg/min (01/25/22 1600)    I/O last 3 completed shifts: In: 4114.3 [P.O.:600; I.V.:1851; Blood:695; IV Piggyback:968.3] Out: 3620 [Urine:1970; Chest Tube:1650]      Latest Ref Rng & Units 01/25/2022    4:39 AM 01/24/2022    3:56 PM 01/24/2022    4:53 AM  CBC  WBC 4.0 - 10.5 K/uL 15.0   16.1     Hemoglobin 13.0 - 17.0 g/dL 8.5   9.4   7.5    Hematocrit 39.0 - 52.0 % 25.5   26.5   22.0    Platelets 150 - 400 K/uL 93   PLATELET CLUMPS NOTED ON SMEAR, UNABLE TO ESTIMATE          Latest Ref Rng & Units 01/25/2022    4:39 AM 01/24/2022    3:39 PM 01/24/2022    4:53 AM  BMP  Glucose 70 - 99 mg/dL 01/26/2022   384     BUN 8 - 23 mg/dL 14   12     Creatinine 0.61 - 1.24 mg/dL 536   4.68     Sodium 135 - 145 mmol/L 135   135   138    Potassium 3.5 - 5.1 mmol/L 4.4   4.6   4.4    Chloride 98 - 111 mmol/L 105   106     CO2 22 - 32 mmol/L 26   26     Calcium 8.9 - 10.3 mg/dL 9.0   8.6       ABG    Component Value Date/Time   PHART 7.364 01/24/2022 0453   PCO2ART 39.7 01/24/2022 0453   PO2ART 73 (L) 01/24/2022 0453   HCO3 22.6 01/24/2022 0453   TCO2 24 01/24/2022 0453    ACIDBASEDEF 3.0 (H) 01/24/2022 0453   O2SAT 77 01/25/2022 0439       01/27/2022, MD 01/25/2022 5:18 PM

## 2022-01-25 NOTE — Progress Notes (Signed)
Arrived to patient's room. Up to restroom per nurse. Please return. Tomasita Morrow, RN VAST

## 2022-01-25 NOTE — Care Management (Signed)
  Transition of Care (TOC) Screening Note   Patient Details  Name: Mekai Wilkinson Date of Birth: [birth year]   Transition of Care [facility]) CM/SW Contact:    Gala Lewandowsky, RN Phone Number: 01/25/2022, 1:59 PM    Transition of Care Department [facility]) has reviewed the patient and no TOC needs have been identified at this time. Case Manager will continue to follow for additional transition of care needs as the patient progresses.

## 2022-01-26 ENCOUNTER — Inpatient Hospital Stay (HOSPITAL_COMMUNITY): Payer: Medicare Other

## 2022-01-26 LAB — CBC
HCT: 24.4 % — ABNORMAL LOW (ref 39.0–52.0)
Hemoglobin: 8.1 g/dL — ABNORMAL LOW (ref 13.0–17.0)
MCH: 32.1 pg (ref 26.0–34.0)
MCHC: 33.2 g/dL (ref 30.0–36.0)
MCV: 96.8 fL (ref 80.0–100.0)
Platelets: 94 10*3/uL — ABNORMAL LOW (ref 150–400)
RBC: 2.52 MIL/uL — ABNORMAL LOW (ref 4.22–5.81)
RDW: 14.2 % (ref 11.5–15.5)
WBC: 12.1 10*3/uL — ABNORMAL HIGH (ref 4.0–10.5)
nRBC: 0 % (ref 0.0–0.2)

## 2022-01-26 LAB — COOXEMETRY PANEL
Carboxyhemoglobin: 1.5 % (ref 0.5–1.5)
Methemoglobin: <0.7 % (ref 0.0–1.5)
O2 Saturation: 68.7 %
Total hemoglobin: 8.5 g/dL — ABNORMAL LOW (ref 12.0–16.0)

## 2022-01-26 LAB — BASIC METABOLIC PANEL
Anion gap: 4 — ABNORMAL LOW (ref 5–15)
BUN: 14 mg/dL (ref 8–23)
CO2: 31 mmol/L (ref 22–32)
Calcium: 9 mg/dL (ref 8.9–10.3)
Chloride: 98 mmol/L (ref 98–111)
Creatinine, Ser: 0.83 mg/dL (ref 0.61–1.24)
GFR, Estimated: >60 mL/min (ref >60)
Glucose, Bld: 108 mg/dL — ABNORMAL HIGH (ref 70–99)
Potassium: 3.9 mmol/L (ref 3.5–5.1)
Sodium: 133 mmol/L — ABNORMAL LOW (ref 135–145)

## 2022-01-26 LAB — GLUCOSE, CAPILLARY
Glucose-Capillary: 100 mg/dL — ABNORMAL HIGH (ref 70–99)
Glucose-Capillary: 113 mg/dL — ABNORMAL HIGH (ref 70–99)
Glucose-Capillary: 136 mg/dL — ABNORMAL HIGH (ref 70–99)
Glucose-Capillary: 137 mg/dL — ABNORMAL HIGH (ref 70–99)
Glucose-Capillary: 147 mg/dL — ABNORMAL HIGH (ref 70–99)

## 2022-01-26 LAB — MAGNESIUM: Magnesium: 1.8 mg/dL (ref 1.7–2.4)

## 2022-01-26 MED ORDER — AMIODARONE HCL IN DEXTROSE 360-4.14 MG/200ML-% IV SOLN
30.0000 mg/h | INTRAVENOUS | Status: DC
  Filled 2022-01-26 (×3): qty 200

## 2022-01-26 MED ORDER — DEXMEDETOMIDINE HCL IN NACL 400 MCG/100ML IV SOLN: 0.4000 ug/kg/h | INTRAVENOUS | Status: DC

## 2022-01-26 MED ORDER — AMIODARONE HCL IN DEXTROSE 360-4.14 MG/200ML-% IV SOLN
60.0000 mg/h | INTRAVENOUS | Status: AC
Start: 2022-01-26 — End: 2022-01-26

## 2022-01-26 MED ORDER — AMIODARONE LOAD VIA INFUSION
150.0000 mg | Freq: Once | INTRAVENOUS | Status: AC
  Administered 2022-01-26: 150 mg via INTRAVENOUS
  Filled 2022-01-26: qty 83.34

## 2022-01-26 MED ORDER — AMIODARONE HCL IN DEXTROSE 360-4.14 MG/200ML-% IV SOLN
INTRAVENOUS | Status: AC
  Filled 2022-01-26: qty 200

## 2022-01-26 MED ORDER — DEXMEDETOMIDINE HCL IN NACL 400 MCG/100ML IV SOLN
INTRAVENOUS | Status: AC
  Administered 2022-01-26: 0.4 ug/kg/h via INTRAVENOUS
  Filled 2022-01-26: qty 100

## 2022-01-26 NOTE — Progress Notes (Signed)
[address]       [place] [address]             [phone]                 3 Days Post-Op Procedure(s) (LRB): CORONARY ARTERY BYPASS GRAFTING (CABG) X4 USING LEFT INTERNAL MAMMARY ARTERY, LEFT RADIAL ARTERY, AND RIGHT GREATER SAPHENOUS VEIN HARVESTED ENDOSCOPICALLY (N/A) RADIAL ARTERY HARVEST (Left) TRANSESOPHAGEAL ECHOCARDIOGRAM (TEE) (N/A)   Events: Some confusion, and afib overnight Confusion improved. Back in sinus with amio gtt _______________________________________________________________ Vitals: BP 109/69   Pulse 87   Temp 98.7 F (37.1 C) (Oral)   Resp 20   Ht 5\' 9"  (1.753 m)   Wt 84.2 kg   SpO2 96%   BMI 27.41 kg/m  Filed Weights   01/23/22 0623 01/24/22 0500 01/25/22 0500  Weight: 76.2 kg 84.5 kg 84.2 kg     - Neuro: alert NAd  - Cardiovascular: sinus  Drips: amio 60, milr 0.125.   CVP:  [4 mmHg-18 mmHg] 5 mmHg  - Pulm: EWOB    ABG    Component Value Date/Time   PHART 7.364 01/24/2022 0453   PCO2ART 39.7 01/24/2022 0453   PO2ART 73 (L) 01/24/2022 0453   HCO3 22.6 01/24/2022 0453   TCO2 24 01/24/2022 0453   ACIDBASEDEF 3.0 (H) 01/24/2022 0453   O2SAT 68.7 01/26/2022 0315    - Abd: ND - Extremity: warm  .Intake/Output      05/26 0701 05/27 0700 05/27 0701 05/28 0700   P.O. 550    I.V. (mL/kg) 141.6 (1.7)    Blood     IV Piggyback     Total Intake(mL/kg) 691.6 (8.2)    Urine (mL/kg/hr) 3480 (1.7) 675 (2.8)   Chest Tube 390    Total Output 3870 675   Net -3178.4 -675        Urine Occurrence 1 x       _______________________________________________________________ Labs:    Latest Ref Rng & Units 01/26/2022    3:15 AM 01/25/2022    4:39 AM 01/24/2022    3:56 PM  CBC  WBC 4.0 - 10.5 K/uL 12.1   15.0   16.1    Hemoglobin 13.0 - 17.0 g/dL 8.1   8.5   9.4    Hematocrit 39.0 - 52.0 % 24.4   25.5   26.5    Platelets 150 - 400 K/uL 94   93   PLATELET CLUMPS NOTED ON SMEAR, UNABLE TO ESTIMATE        Latest  Ref Rng & Units 01/26/2022    3:15 AM 01/25/2022    4:39 AM 01/24/2022    3:39 PM  CMP  Glucose 70 - 99 mg/dL 108   129   153    BUN 8 - 23 mg/dL 14   14   12     Creatinine 0.61 - 1.24 mg/dL 0.83   0.71   0.77    Sodium 135 - 145 mmol/L 133   135   135    Potassium 3.5 - 5.1 mmol/L 3.9   4.4   4.6    Chloride 98 - 111 mmol/L 98   105   106    CO2 22 - 32 mmol/L 31   26   26     Calcium 8.9 - 10.3 mg/dL 9.0   9.0   8.6      CXR: clear  _______________________________________________________________  Assessment and Plan: POD 3 s/p  CABG  Neuro: pain controlled CV: stop milr.  Will keep amio Pulm: IS ambulation today Renal: creat stable.  diuresing GI: on diet Heme: stable ID: afebrile Endo: SSI Dispo: continue ICU care   Lajuana Matte 01/26/2022 9:54 AM

## 2022-01-26 NOTE — Progress Notes (Signed)
Patient in 2H Rm1 converted to a irregular rapid heart rate with rates sustaining between 150-170 at approximately 0542. Dr. Cliffton Asters called and paged, still waiting for call back.   Hemodynamically pressure are maintaining MAPS >65.  See MAR for current medications and PRNs.

## 2022-01-27 LAB — TYPE AND SCREEN
ABO/RH(D): O POS
Antibody Screen: NEGATIVE
Unit division: 0
Unit division: 0
Unit division: 0
Unit division: 0

## 2022-01-27 LAB — CBC
HCT: 28.1 % — ABNORMAL LOW (ref 39.0–52.0)
Hemoglobin: 9.8 g/dL — ABNORMAL LOW (ref 13.0–17.0)
MCH: 33.1 pg (ref 26.0–34.0)
MCHC: 34.9 g/dL (ref 30.0–36.0)
MCV: 94.9 fL (ref 80.0–100.0)
Platelets: 140 10*3/uL — ABNORMAL LOW (ref 150–400)
RBC: 2.96 MIL/uL — ABNORMAL LOW (ref 4.22–5.81)
RDW: 13.7 % (ref 11.5–15.5)
WBC: 11.7 10*3/uL — ABNORMAL HIGH (ref 4.0–10.5)
nRBC: 0 % (ref 0.0–0.2)

## 2022-01-27 LAB — BASIC METABOLIC PANEL
Anion gap: 5 (ref 5–15)
BUN: 15 mg/dL (ref 8–23)
CO2: 33 mmol/L — ABNORMAL HIGH (ref 22–32)
Calcium: 9.4 mg/dL (ref 8.9–10.3)
Chloride: 94 mmol/L — ABNORMAL LOW (ref 98–111)
Creatinine, Ser: 0.88 mg/dL (ref 0.61–1.24)
GFR, Estimated: >60 mL/min (ref >60)
Glucose, Bld: 127 mg/dL — ABNORMAL HIGH (ref 70–99)
Potassium: 3.3 mmol/L — ABNORMAL LOW (ref 3.5–5.1)
Sodium: 132 mmol/L — ABNORMAL LOW (ref 135–145)

## 2022-01-27 LAB — BPAM RBC
Blood Product Expiration Date: 202306142359
Blood Product Expiration Date: 202306142359
Blood Product Expiration Date: 202306192359
Blood Product Expiration Date: 202306192359
ISSUE DATE / TIME: 202305241312
ISSUE DATE / TIME: 202305241312
ISSUE DATE / TIME: 202305250625
Unit Type and Rh: 5100
Unit Type and Rh: 5100
Unit Type and Rh: 5100
Unit Type and Rh: 5100

## 2022-01-27 LAB — GLUCOSE, CAPILLARY
Glucose-Capillary: 145 mg/dL — ABNORMAL HIGH (ref 70–99)
Glucose-Capillary: 119 mg/dL — ABNORMAL HIGH (ref 70–99)
Glucose-Capillary: 112 mg/dL — ABNORMAL HIGH (ref 70–99)
Glucose-Capillary: 133 mg/dL — ABNORMAL HIGH (ref 70–99)

## 2022-01-27 MED ORDER — SODIUM CHLORIDE 0.9% FLUSH
3.0000 mL | Freq: Two times a day (BID) | INTRAVENOUS | Status: DC
  Administered 2022-01-27 (×2): 3 mL via INTRAVENOUS

## 2022-01-27 MED ORDER — POTASSIUM CHLORIDE CRYS ER 20 MEQ PO TBCR
40.0000 meq | EXTENDED_RELEASE_TABLET | Freq: Every day | ORAL | Status: DC
  Administered 2022-01-27 – 2022-01-28 (×2): 40 meq via ORAL
  Filled 2022-01-27 (×3): qty 2

## 2022-01-27 MED ORDER — ~~LOC~~ CARDIAC SURGERY, PATIENT & FAMILY EDUCATION: Freq: Once | Status: AC

## 2022-01-27 MED ORDER — AMIODARONE HCL 200 MG PO TABS
400.0000 mg | ORAL_TABLET | Freq: Every day | ORAL | Status: DC
  Administered 2022-01-27 – 2022-01-28 (×2): 400 mg via ORAL
  Filled 2022-01-27 (×2): qty 2

## 2022-01-27 MED ORDER — SODIUM CHLORIDE 0.9 % IV SOLN: 250.0000 mL | INTRAVENOUS | Status: DC | PRN

## 2022-01-27 MED ORDER — POTASSIUM CHLORIDE CRYS ER 20 MEQ PO TBCR
20.0000 meq | EXTENDED_RELEASE_TABLET | ORAL | Status: AC
  Administered 2022-01-27 (×3): 20 meq via ORAL
  Filled 2022-01-27 (×3): qty 1

## 2022-01-27 MED ORDER — SODIUM CHLORIDE 0.9% FLUSH: 3.0000 mL | INTRAVENOUS | Status: DC | PRN

## 2022-01-27 MED ORDER — FUROSEMIDE 40 MG PO TABS
40.0000 mg | ORAL_TABLET | Freq: Every day | ORAL | Status: DC
Start: 2022-01-27 — End: 2022-01-29
  Administered 2022-01-28: 40 mg via ORAL
  Filled 2022-01-27: qty 1

## 2022-01-27 NOTE — Progress Notes (Signed)
[address]       [place] [address]             [phone]                 4 Days Post-Op Procedure(s) (LRB): CORONARY ARTERY BYPASS GRAFTING (CABG) X4 USING LEFT INTERNAL MAMMARY ARTERY, LEFT RADIAL ARTERY, AND RIGHT GREATER SAPHENOUS VEIN HARVESTED ENDOSCOPICALLY (N/A) RADIAL ARTERY HARVEST (Left) TRANSESOPHAGEAL ECHOCARDIOGRAM (TEE) (N/A)   Events: No events overnight _______________________________________________________________ Vitals: BP (!) 93/56   Pulse 83   Temp 98 F (36.7 C) (Oral)   Resp 20   Ht 5\' 9"  (1.753 m)   Wt 84.2 kg   SpO2 96%   BMI 27.41 kg/m  Filed Weights   01/23/22 0623 01/24/22 0500 01/25/22 0500  Weight: 76.2 kg 84.5 kg 84.2 kg     - Neuro: alert NAD  - Cardiovascular: sinus  Drips: amio 30    - Pulm: EWOB    ABG    Component Value Date/Time   PHART 7.364 01/24/2022 0453   PCO2ART 39.7 01/24/2022 0453   PO2ART 73 (L) 01/24/2022 0453   HCO3 22.6 01/24/2022 0453   TCO2 24 01/24/2022 0453   ACIDBASEDEF 3.0 (H) 01/24/2022 0453   O2SAT 68.7 01/26/2022 0315    - Abd: ND - Extremity: warm  .Intake/Output      05/27 0701 05/28 0700 05/28 0701 05/29 0700   P.O. 430    I.V. (mL/kg) 559.8 (6.6) 50 (0.6)   Total Intake(mL/kg) 989.8 (11.8) 50 (0.6)   Urine (mL/kg/hr) 4585 (2.3) 650 (2)   Stool 0 0   Chest Tube 190 90   Total Output 4775 740   Net -3785.2 -690        Urine Occurrence  1 x   Stool Occurrence 3 x 1 x      _______________________________________________________________ Labs:    Latest Ref Rng & Units 01/27/2022    4:18 AM 01/26/2022    3:15 AM 01/25/2022    4:39 AM  CBC  WBC 4.0 - 10.5 K/uL 11.7   12.1   15.0    Hemoglobin 13.0 - 17.0 g/dL 9.8   8.1   8.5    Hematocrit 39.0 - 52.0 % 28.1   24.4   25.5    Platelets 150 - 400 K/uL 140   94   93        Latest Ref Rng & Units 01/27/2022    4:18 AM 01/26/2022    3:15 AM 01/25/2022    4:39 AM  CMP  Glucose 70 - 99 mg/dL 01/27/2022   703    500    BUN 8 - 23 mg/dL 15   14   14     Creatinine 0.61 - 1.24 mg/dL 938     1.82    Sodium 135 - 145 mmol/L 132   133   135    Potassium 3.5 - 5.1 mmol/L 3.3   3.9   4.4    Chloride 98 - 111 mmol/L 94   98   105    CO2 22 - 32 mmol/L 33   31   26    Calcium 8.9 - 10.3 mg/dL 9.4   9.0   9.0      CXR: -  _______________________________________________________________  Assessment and Plan: POD 4 s/p CABG  Neuro: pain controlled CV: PO amio today.  Soft pressures, holding BB Pulm: IS ambulation today Renal: creat stable.  Replacing K GI: on diet Heme: stable ID: afebrile Endo: SSI Dispo: floor   Naya Ilagan O Avin Upperman 01/27/2022 10:52 AM

## 2022-01-27 NOTE — Plan of Care (Signed)
  Problem: Health Behavior/Discharge Planning: Goal: Ability to manage health-related needs will improve Outcome: Progressing   Problem: Clinical Measurements: Goal: Will remain free from infection Outcome: Progressing Goal: Respiratory complications will improve Outcome: Progressing Goal: Cardiovascular complication will be avoided Outcome: Progressing   

## 2022-01-27 NOTE — Progress Notes (Incomplete)
Pacing wires pulled

## 2022-01-27 NOTE — Progress Notes (Signed)
Patient arrived to 4E from 2 heart. Vitals taken and stable. Placed on tele monitor and CCMD notified. CHG completed. Skin assessment and noted several skin tears on patient's back from [place] monitor. Placed foam. Patient oriented to unit and staff. Call bell within reach. Family at the bedside.  Roger Smith

## 2022-01-28 LAB — CBC
HCT: 30.4 % — ABNORMAL LOW (ref 39.0–52.0)
Hemoglobin: 10.3 g/dL — ABNORMAL LOW (ref 13.0–17.0)
MCH: 31.9 pg (ref 26.0–34.0)
MCHC: 33.9 g/dL (ref 30.0–36.0)
MCV: 94.1 fL (ref 80.0–100.0)
Platelets: 171 10*3/uL (ref 150–400)
RBC: 3.23 MIL/uL — ABNORMAL LOW (ref 4.22–5.81)
RDW: 13.6 % (ref 11.5–15.5)
WBC: 11.2 10*3/uL — ABNORMAL HIGH (ref 4.0–10.5)
nRBC: 0 % (ref 0.0–0.2)

## 2022-01-28 LAB — BASIC METABOLIC PANEL
Anion gap: 6 (ref 5–15)
BUN: 14 mg/dL (ref 8–23)
CO2: 29 mmol/L (ref 22–32)
Calcium: 9.7 mg/dL (ref 8.9–10.3)
Chloride: 97 mmol/L — ABNORMAL LOW (ref 98–111)
Creatinine, Ser: 0.91 mg/dL (ref 0.61–1.24)
GFR, Estimated: >60 mL/min (ref >60)
Glucose, Bld: 110 mg/dL — ABNORMAL HIGH (ref 70–99)
Potassium: 3.5 mmol/L (ref 3.5–5.1)
Sodium: 132 mmol/L — ABNORMAL LOW (ref 135–145)

## 2022-01-28 LAB — GLUCOSE, CAPILLARY
Glucose-Capillary: 127 mg/dL — ABNORMAL HIGH (ref 70–99)
Glucose-Capillary: 126 mg/dL — ABNORMAL HIGH (ref 70–99)

## 2022-01-28 MED ORDER — OCUVITE-LUTEIN PO CAPS
1.0000 | ORAL_CAPSULE | Freq: Every day | ORAL | Status: DC
  Filled 2022-01-28: qty 1

## 2022-01-28 MED ORDER — PROSIGHT PO TABS
1.0000 | ORAL_TABLET | Freq: Every day | ORAL | Status: DC
  Administered 2022-01-28 – 2022-01-29 (×2): 1 via ORAL
  Filled 2022-01-28 (×2): qty 1

## 2022-01-28 NOTE — Progress Notes (Addendum)
      [address]       [place] [address]             [phone]       5 Days Post-Op Procedure(s) (LRB): CORONARY ARTERY BYPASS GRAFTING (CABG) X4 USING LEFT INTERNAL MAMMARY ARTERY, LEFT RADIAL ARTERY, AND RIGHT GREATER SAPHENOUS VEIN HARVESTED ENDOSCOPICALLY (N/A) RADIAL ARTERY HARVEST (Left) TRANSESOPHAGEAL ECHOCARDIOGRAM (TEE) (N/A)  Subjective:  Patient sitting up eating breakfast.  He has no complaints.  + ambulation   + BM  Objective: Vital signs in last 24 hours: Temp:  [97.9 F (36.6 C)-98.6 F (37 C)] 98.6 F (37 C) (05/29 0749) Pulse Rate:  [77-95] 92 (05/29 0749) Cardiac Rhythm: Normal sinus rhythm;Bundle branch block (05/29 0825) Resp:  [15-27] 17 (05/29 0749) BP: (93-130)/(56-84) 114/84 (05/29 0749) SpO2:  [86 %-99 %] 95 % (05/29 0749) Weight:  [75.1 kg] 75.1 kg (05/29 0500)  Intake/Output from previous day: 05/28 0701 - 05/29 0700 In: 686.8 [P.O.:240; I.V.:446.8] Out: 2315 [Urine:2225; Chest Tube:90]  General appearance: alert, cooperative, and no distress Heart: regular rate and rhythm Lungs: clear to auscultation bilaterally Abdomen: soft, non-tender; bowel sounds normal; no masses,  no organomegaly Extremities: edema trace Wound: clean and dry, ecchymosis RLE, no parasthesia in left arm  Lab Results: Recent Labs    01/27/22 0418 01/28/22 0123  WBC 11.7* 11.2*  HGB 9.8* 10.3*  HCT 28.1* 30.4*  PLT 140* 171   BMET:  Recent Labs    01/27/22 0418 01/28/22 0123  NA 132* 132*  K 3.3* 3.5  CL 94* 97*  CO2 33* 29  GLUCOSE 127* 110*  BUN 15 14  CREATININE 0.88 0.91  CALCIUM 9.4 9.7    PT/INR: No results for input(s): LABPROT, INR in the last 72 hours. ABG    Component Value Date/Time   PHART 7.364 01/24/2022 0453   HCO3 22.6 01/24/2022 0453   TCO2 24 01/24/2022 0453   ACIDBASEDEF 3.0 (H) 01/24/2022 0453   O2SAT 68.7 01/26/2022 0315   CBG (last 3)  Recent Labs    01/27/22 1711 01/27/22 2114 01/28/22 0616   GLUCAP 112* 133* 127*    Assessment/Plan: S/P Procedure(s) (LRB): CORONARY ARTERY BYPASS GRAFTING (CABG) X4 USING LEFT INTERNAL MAMMARY ARTERY, LEFT RADIAL ARTERY, AND RIGHT GREATER SAPHENOUS VEIN HARVESTED ENDOSCOPICALLY (N/A) RADIAL ARTERY HARVEST (Left) TRANSESOPHAGEAL ECHOCARDIOGRAM (TEE) (N/A)  CV- NSR with PVCs- continue Coreg, Amiodarone, Imdur for RA graft Pulm- off oxygen, continue IS Renal- creatinine has been stable, + edema on exam, continue Lasix, potassium Expected post operative blood loss anemia, mild Hgb at 10.3 CBGs controlled, patient is not a diabetic, will d/c SSIP Dispo- patient stable, in NSR with PVCs, EPW are out, continue diuretics, if remains stable, for d/c in AM   LOS: 5 days  Roger Dandy, Roger Smith 01/28/2022   Agree with above Doing well Dispo planning  Roger Smith

## 2022-01-28 NOTE — Progress Notes (Signed)
Pt ambulated approx 400' on RA with rolling walker.  Tolerated well, requiring minimal assistance.

## 2022-01-28 NOTE — Progress Notes (Signed)
SBP in the 80s taken with small cuff on R wrist.  Pt sitting up in recliner, states he feels tired and weak, no c/o dizziness, lightheaded, etc.  Does not wish to go back to bed.  Assisted with reclining a bit more in chair.  Karsten Ro, PA notified. Received parameters to hold BB if SBP<100.

## 2022-01-28 NOTE — Care Management Important Message (Signed)
Important Message  Patient Details  Name: Menachem Urbanek MRN: [id number] Date of Birth: 1941-04-08   Medicare Important Message Given:  Yes     Aubert Choyce 01/28/2022, 2:00 PM

## 2022-01-28 NOTE — Progress Notes (Signed)
Second walk, again around 400' with walker.  Tolerated well, RA, min assist.

## 2022-01-28 NOTE — Progress Notes (Signed)
Pt's anticipated discharge for tomorrow. Daughter, Boyd Kerbs, will be picking up and wants to be here for all education. She plans to be here around 9am.

## 2022-01-29 ENCOUNTER — Inpatient Hospital Stay (HOSPITAL_COMMUNITY): Payer: Medicare Other

## 2022-01-29 ENCOUNTER — Other Ambulatory Visit (HOSPITAL_COMMUNITY): Payer: Self-pay

## 2022-01-29 LAB — BASIC METABOLIC PANEL
Anion gap: 6 (ref 5–15)
BUN: 14 mg/dL (ref 8–23)
CO2: 29 mmol/L (ref 22–32)
Calcium: 9.8 mg/dL (ref 8.9–10.3)
Chloride: 98 mmol/L (ref 98–111)
Creatinine, Ser: 0.95 mg/dL (ref 0.61–1.24)
GFR, Estimated: >60 mL/min (ref >60)
Glucose, Bld: 114 mg/dL — ABNORMAL HIGH (ref 70–99)
Potassium: 3.6 mmol/L (ref 3.5–5.1)
Sodium: 133 mmol/L — ABNORMAL LOW (ref 135–145)

## 2022-01-29 LAB — CBC
HCT: 30.7 % — ABNORMAL LOW (ref 39.0–52.0)
Hemoglobin: 10.3 g/dL — ABNORMAL LOW (ref 13.0–17.0)
MCH: 32.3 pg (ref 26.0–34.0)
MCHC: 33.6 g/dL (ref 30.0–36.0)
MCV: 96.2 fL (ref 80.0–100.0)
Platelets: 204 10*3/uL (ref 150–400)
RBC: 3.19 MIL/uL — ABNORMAL LOW (ref 4.22–5.81)
RDW: 13.7 % (ref 11.5–15.5)
WBC: 10.2 10*3/uL (ref 4.0–10.5)
nRBC: 0 % (ref 0.0–0.2)

## 2022-01-29 MED ORDER — DAPAGLIFLOZIN PROPANEDIOL 10 MG PO TABS
10.0000 mg | ORAL_TABLET | Freq: Every day | ORAL | Status: DC
  Filled 2022-01-29: qty 1

## 2022-01-29 MED ORDER — DAPAGLIFLOZIN PROPANEDIOL 10 MG PO TABS: 10.0000 mg | ORAL_TABLET | Freq: Every day | ORAL | 1 refills | Status: DC

## 2022-01-29 MED ORDER — POTASSIUM CHLORIDE CRYS ER 10 MEQ PO TBCR: 10.0000 meq | EXTENDED_RELEASE_TABLET | Freq: Every day | ORAL | 0 refills | Status: DC

## 2022-01-29 MED ORDER — ISOSORBIDE MONONITRATE ER 30 MG PO TB24
15.0000 mg | ORAL_TABLET | Freq: Every day | ORAL | 0 refills | Status: DC
  Filled 2022-01-29: qty 15, 30d supply, fill #0

## 2022-01-29 MED ORDER — CARVEDILOL 6.25 MG PO TABS: 6.2500 mg | ORAL_TABLET | Freq: Two times a day (BID) | ORAL | 1 refills | Status: DC

## 2022-01-29 MED ORDER — ASPIRIN 325 MG PO TBEC: 325.0000 mg | DELAYED_RELEASE_TABLET | Freq: Every day | ORAL | Status: AC

## 2022-01-29 MED ORDER — AMIODARONE HCL 200 MG PO TABS
200.0000 mg | ORAL_TABLET | Freq: Two times a day (BID) | ORAL | Status: DC
  Administered 2022-01-29: 200 mg via ORAL
  Filled 2022-01-29: qty 1

## 2022-01-29 MED ORDER — TRAMADOL HCL 50 MG PO TABS: 50.0000 mg | ORAL_TABLET | Freq: Four times a day (QID) | ORAL | 0 refills | Status: DC | PRN

## 2022-01-29 MED ORDER — FUROSEMIDE 20 MG PO TABS
20.0000 mg | ORAL_TABLET | Freq: Every day | ORAL | 0 refills | Status: DC
  Filled 2022-01-29: qty 5, 5d supply, fill #0

## 2022-01-29 MED ORDER — ISOSORBIDE MONONITRATE ER 30 MG PO TB24: 15.0000 mg | ORAL_TABLET | Freq: Every day | ORAL | 0 refills | Status: DC

## 2022-01-29 MED ORDER — CARVEDILOL 6.25 MG PO TABS
6.2500 mg | ORAL_TABLET | Freq: Two times a day (BID) | ORAL | 1 refills | Status: DC
  Filled 2022-01-29: qty 60, 30d supply, fill #0

## 2022-01-29 MED ORDER — TRAMADOL HCL 50 MG PO TABS
50.0000 mg | ORAL_TABLET | Freq: Four times a day (QID) | ORAL | 0 refills | Status: DC | PRN
Start: 2022-01-29 — End: 2022-03-25
  Filled 2022-01-29: qty 28, 7d supply, fill #0

## 2022-01-29 MED ORDER — CARVEDILOL 6.25 MG PO TABS
6.2500 mg | ORAL_TABLET | Freq: Two times a day (BID) | ORAL | Status: DC
  Administered 2022-01-29: 6.25 mg via ORAL
  Filled 2022-01-29: qty 1

## 2022-01-29 MED ORDER — FUROSEMIDE 20 MG PO TABS
20.0000 mg | ORAL_TABLET | Freq: Every day | ORAL | Status: DC
  Administered 2022-01-29: 20 mg via ORAL
  Filled 2022-01-29: qty 1

## 2022-01-29 MED ORDER — POTASSIUM CHLORIDE CRYS ER 20 MEQ PO TBCR
40.0000 meq | EXTENDED_RELEASE_TABLET | Freq: Two times a day (BID) | ORAL | Status: DC
  Administered 2022-01-29: 40 meq via ORAL
  Filled 2022-01-29: qty 2

## 2022-01-29 MED ORDER — FUROSEMIDE 20 MG PO TABS
20.0000 mg | ORAL_TABLET | Freq: Every day | ORAL | 0 refills | Status: DC
Start: 2022-01-30 — End: 2022-01-29

## 2022-01-29 MED ORDER — AMIODARONE HCL 200 MG PO TABS
200.0000 mg | ORAL_TABLET | Freq: Two times a day (BID) | ORAL | 1 refills | Status: DC
Start: 2022-01-29 — End: 2022-01-29

## 2022-01-29 MED ORDER — POTASSIUM CHLORIDE CRYS ER 10 MEQ PO TBCR
10.0000 meq | EXTENDED_RELEASE_TABLET | Freq: Every day | ORAL | 0 refills | Status: DC
  Filled 2022-01-29: qty 5, 5d supply, fill #0

## 2022-01-29 MED ORDER — DAPAGLIFLOZIN PROPANEDIOL 10 MG PO TABS
10.0000 mg | ORAL_TABLET | Freq: Every day | ORAL | 1 refills | Status: DC
  Filled 2022-01-29: qty 30, 30d supply, fill #0

## 2022-01-29 MED ORDER — AMIODARONE HCL 200 MG PO TABS
200.0000 mg | ORAL_TABLET | Freq: Two times a day (BID) | ORAL | 1 refills | Status: DC
Start: 2022-01-29 — End: 2022-02-06
  Filled 2022-01-29: qty 60, 30d supply, fill #0

## 2022-01-29 NOTE — Progress Notes (Signed)
Paged oncall CT surgeon to notify them pt went into a-fib rvr w/HR in the 160s...Marland Kitchen pt is asymptomatic .... denies chest pain, SOB, dyspnea... pt is A&O x4... no acute distress.

## 2022-01-29 NOTE — Progress Notes (Addendum)
      [address]       [place] [address]             [phone]        6 Days Post-Op Procedure(s) (LRB): CORONARY ARTERY BYPASS GRAFTING (CABG) X4 USING LEFT INTERNAL MAMMARY ARTERY, LEFT RADIAL ARTERY, AND RIGHT GREATER SAPHENOUS VEIN HARVESTED ENDOSCOPICALLY (N/A) RADIAL ARTERY HARVEST (Left) TRANSESOPHAGEAL ECHOCARDIOGRAM (TEE) (N/A)  Subjective: Patient eating this am. He has some sternal, incisonal tenderness  Objective: Vital signs in last 24 hours: Temp:  [98.1 F (36.7 C)-98.8 F (37.1 C)] 98.3 F (36.8 C) (05/30 0455) Pulse Rate:  [76-104] 102 (05/30 0456) Cardiac Rhythm: Normal sinus rhythm (05/29 2000) Resp:  [15-27] 21 (05/30 0642) BP: (88-126)/(55-84) 126/68 (05/30 0455) SpO2:  [95 %-100 %] 97 % (05/30 0456) Weight:  [74.6 kg] 74.6 kg (05/30 0400)  Pre op weight 76.2 kg Current Weight  01/29/22 74.6 kg      Intake/Output from previous day: 05/29 0701 - 05/30 0700 In: -  Out: 1 [Stool:1]   Physical Exam:  Cardiovascular: RRR Pulmonary: Clear to auscultation bilaterally Abdomen: Soft, non tender, bowel sounds present. Extremities: Trace bilateral lower extremity edema. Wounds: Clean and dry.  No erythema or signs of infection.  Lab Results: CBC: Recent Labs    01/28/22 0123 01/29/22 0220  WBC 11.2* 10.2  HGB 10.3* 10.3*  HCT 30.4* 30.7*  PLT 171 204   BMET:  Recent Labs    01/28/22 0123 01/29/22 0220  NA 132* 133*  K 3.5 3.6  CL 97* 98  CO2 29 29  GLUCOSE 110* 114*  BUN 14 14  CREATININE 0.91 0.95  CALCIUM 9.7 9.8    PT/INR:  Lab Results  Component Value Date   INR 1.3 (H) 01/24/2022   INR 1.5 (H) 01/23/2022   INR 1.0 01/21/2022   ABG:  INR: Will add last result for INR, ABG once components are confirmed Will add last 4 CBG results once components are confirmed  Assessment/Plan:  1. CV - ST, PVCs. On Amiodarone 400 mg daily, Coreg 3.125 mg bid, and Imdur 15 mg daily. Will discuss sinus  tachycardia with Dr. Prescott Gum 2.  Pulmonary - On room air. CXR appears stable. Encourage incentive spirometer. 3. Volume Overload - On Lasix 40 mg daily 4.  Expected post op acute blood loss anemia - H and H stable at 10.3 and 30.7 5. Supplement potassium 6. Will discuss disposition with Dr. Prescott Gum  Sharalyn Ink ZimmermanPA-C 7:32 AM   Doing well- will titrate meds for sinus tachycardia and observe rhythm  with poss pm discharge  patient examined and medical record reviewed,agree with above note. Dahlia Byes 01/29/2022

## 2022-01-29 NOTE — Plan of Care (Signed)
DISCHARGE NOTE HOME Roger Smith to be discharged home per MD order. Discussed prescriptions and follow up appointments with the patient. Prescriptions given to patient's daughter; medication list explained in detail. Patient's daughter verbalized understanding.  Skin clean, dry and intact without evidence of skin break down, no evidence of skin tears noted. IV catheter discontinued intact. Site without signs and symptoms of complications. Dressing and pressure applied. Pt denies pain at the site currently. No complaints noted.  Patient free of lines, drains, and wounds.  CT sutures removed and steri strips were placed with benzoin.  An After Visit Summary (AVS) was printed and given to the patient. Patient escorted via wheelchair, and discharged home via private auto.  Arlice Colt, RN

## 2022-01-29 NOTE — Progress Notes (Signed)
Heart Failure Nurse Navigator Progress Note  S/P CABG 5/24  Echo-- 20-30%  TOC appt sch for 6/7 at 12 pm  Meredith Staggers, RN, BSN, [facility] Heart Failure Navigator Heart & Vascular Care Navigation Team

## 2022-01-29 NOTE — Progress Notes (Signed)
CARDIAC REHAB PHASE I   PRE:  Rate/Rhythm: 95 SR with PVCs    BP: sitting 110/68    SaO2: 94 RA  MODE:  Ambulation: 470 ft   POST:  Rate/Rhythm: 106 ST with PVCs    BP: sitting 105/66     SaO2: 96 RA  Pt stood with standby assist and walked with RW, standby assist. Tolerated well, no c/o. To BR and walked in room without RW. No ST/afib. Return to recliner.   Discussed with pt and daughter IS, sternal precautions, diet esp low sodium, daily wts, walking, and CRPII. Pt receptive and will refer to [place] CRPII. Daughter with appropriate questions.  [phone]   Ethelda Chick CES, ACSM 01/29/2022 12:01 PM

## 2022-01-29 NOTE — Progress Notes (Signed)
Farxiga 30-day free  RxBin: [id number] PCN: [id number] Member ID: [id number] Group ID: [id number]    Entresto 30-day free (not taking currently, plan to start outpatient if BP allows)  RxBin: [id number] PCN: [id number] Member ID: [id number] Group ID: [id number]   Filbert Schilder, PharmD PGY1 Pharmacy Resident 01/29/2022  12:24 PM  Please check [email or web address] for unit-specific pharmacy phone numbers.

## 2022-01-29 NOTE — TOC Transition Note (Addendum)
Transition of Care [facility]) - CM/SW Discharge Note   Patient Details  Name: Charels Brenizer MRN: [id number] Date of Birth: [birth year]  Transition of Care [facility]) CM/SW Contact:  Verdell Carmine, RN Phone Number: 01/29/2022, 10:50 AM   Clinical Narrative:      Spoke to patient and daughter [phone]. He wil going to his daughters house in [place]. Her Address is [address] [place], [place] [address]. He will be there post op period He received RW and will utilize it.  Went over patient appointments and sternal precautions  They would like a [place] agency that works best with insurance Reached out to [employer] from [place].to see if they covered that area.   ,  Final next level of care: Home w Home Health Services Barriers to Discharge: No Barriers Identified   Patient Goals and CMS Choice        Discharge Placement             Home with daughter in [place], [place]          Discharge Plan and Services                DME Arranged: [place] rolling DME Agency: [employer] Date DME Agency Contacted: 01/28/22 Time DME Agency Contacted: 26 Representative spoke with at DME Agency: [facility] HH Arranged: RN [place] Agency: [facility] Date [place]: 01/29/22 Time [place]: 65 Representative spoke with at [place]: [place] ([place]) Interventions     Readmission Risk Interventions     View : No data to display.

## 2022-01-29 NOTE — Progress Notes (Signed)
Heart Failure Stewardship Pharmacist Progress Note   PCP: Nicoletta Dress, MD PCP-Cardiologist: None    HPI:  81 yo M with PMH of HTN, HLD, right BBB, TIA, frequent PVCs and recent admission in March 2023 for chest pain and syncope who presented to [facility] for a scheduled CABGx4 on 05/24.  He was seen by [facility] on 04/20 for hospital follow-up.  At this visit, a cardiac/coronary CT revealed a coronary calcium score of 906 (70% percentile) with severe stenosis of LAD, Cx, and RCA.  He was started on Lopressor 25 mg BID, sent home with a Zio patch for PVCs that were noted on his EKG, and was scheduled for LHC at [facility].  The Zio patch results revealed multiple episodes of ventricular tachycardia (longest episode lasting 12.8 sec) and 25 episodes of SVT with a total burden of ventricular ectopy of 1.8%.    His planned LHC on 05/03 revealed severe three-vessel CAD (prox RCA 99%, Ost-Prox Cx 99%, and mid LAD 100%) and he was referred to [facility] for CABG.    Pre-op Echo on 05/22 prior  revealed severely reduced LVEF of <20% with global hypokinesis, G3DD, moderately reduced RV function, mildly elevated PASP and mild MR.   He underwent a successful CABG x4 on 05/24 and was weaned off pressors and inotropes.  He developed post-op Afib on 05/26, was started on amiodarone infusion and converted to NSR with occasional PVCs.  He was + 5.2 L post op and diuresed 13.8 L with total net negative 3.26 L at discharge.  Carvedilol was titrated to 6.25 mg twice daily.   Current HF Medications: Diuretic: furosemide PO 20 mg daily Beta Blocker: carvedilol 12.5 mg BID  Prior to admission HF Medications: None  Pertinent Lab Values: Serum creatinine 0.95, BUN 14, Potassium 3.6, Sodium 133, Magnesium 1.8, A1c 5.3%  Vital Signs: Weight: 164 lbs lbs (admission weight: 168 lbs) Blood pressure: 100-20s/60-80s  Heart rate: 90s  I/O: -3.3 L yesterday; net -3.26 L   Medication Assistance / Insurance Benefits  Check: Does the patient have prescription insurance?  Yes Type of insurance plan: Medicare  Does the patient qualify for medication assistance through manufacturers or grants?   Yes Eligible grants and/or patient assistance programs: pending Medication assistance applications in progress: yes  Medication assistance applications approved: no  Outpatient Pharmacy:  Prior to admission outpatient pharmacy: [facility] Is the patient willing to use [facility] at discharge? Yes Is the patient willing to transition their outpatient pharmacy to utilize a [facility] outpatient pharmacy?   No    Assessment: 1. 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 NYHA class II-III symptoms. - Continue furosemide PO 20 mg daily - Continue carvedilol 6.25 mg twice daily - Give potassium 40 mEq x2 to keep K > 4 - Give 2g IV Mg to keep Mg > 2 - GDMT initiation limited by recent CABG 05/24 requiring pressors through the 26th and inotropes through the 27th   Plan: 1) Medication changes recommended at this time: - Start Farxiga 10 mg daily - At 06/07 TOC visit, start spironolactone 12.5 mg daily or Entresto 24-26 mg BID pending BP room (was soft at discharge)  2) Patient assistance: - none pending currently  Farxiga 30-day free RxBin: [id number] PCN: [id number] Member ID: [id number] Group ID: [id number]     Entresto 30-day free (not taking currently, plan to start outpatient if BP allows) RxBin: [id number] PCN: [id number] Member ID: [id number] Group ID: [id number])  Education  - Patient has been educated on current HF medications and potential additions to HF medication regimen - Patient verbalizes understanding that over the next few months, these medication doses may change and more medications may be added to optimize HF regimen - Patient has been educated on basic disease state pathophysiology and goals of therapy  Laurey Arrow, PharmD PGY1 Pharmacy Resident 01/29/2022  11:13 AM

## 2022-01-30 DIAGNOSIS — I2510 Atherosclerotic heart disease of native coronary artery without angina pectoris: Secondary | ICD-10-CM | POA: Diagnosis not present

## 2022-01-30 DIAGNOSIS — I4891 Unspecified atrial fibrillation: Secondary | ICD-10-CM | POA: Diagnosis not present

## 2022-01-30 DIAGNOSIS — Z8673 Personal history of transient ischemic attack (TIA), and cerebral infarction without residual deficits: Secondary | ICD-10-CM | POA: Diagnosis not present

## 2022-01-30 DIAGNOSIS — K219 Gastro-esophageal reflux disease without esophagitis: Secondary | ICD-10-CM | POA: Diagnosis not present

## 2022-01-30 DIAGNOSIS — I252 Old myocardial infarction: Secondary | ICD-10-CM | POA: Diagnosis not present

## 2022-01-30 DIAGNOSIS — Z9181 History of falling: Secondary | ICD-10-CM | POA: Diagnosis not present

## 2022-01-30 DIAGNOSIS — E785 Hyperlipidemia, unspecified: Secondary | ICD-10-CM | POA: Diagnosis not present

## 2022-01-30 DIAGNOSIS — Z951 Presence of aortocoronary bypass graft: Secondary | ICD-10-CM | POA: Diagnosis not present

## 2022-01-30 DIAGNOSIS — I8393 Asymptomatic varicose veins of bilateral lower extremities: Secondary | ICD-10-CM | POA: Diagnosis not present

## 2022-01-30 DIAGNOSIS — Z7984 Long term (current) use of oral hypoglycemic drugs: Secondary | ICD-10-CM | POA: Diagnosis not present

## 2022-01-30 DIAGNOSIS — Z48812 Encounter for surgical aftercare following surgery on the circulatory system: Secondary | ICD-10-CM | POA: Diagnosis not present

## 2022-01-30 DIAGNOSIS — Z7982 Long term (current) use of aspirin: Secondary | ICD-10-CM | POA: Diagnosis not present

## 2022-01-30 DIAGNOSIS — I4510 Unspecified right bundle-branch block: Secondary | ICD-10-CM | POA: Diagnosis not present

## 2022-02-01 ENCOUNTER — Telehealth (HOSPITAL_COMMUNITY): Payer: Self-pay

## 2022-02-01 NOTE — Telephone Encounter (Signed)
Outside referral faxed to [facility]. 

## 2022-02-04 ENCOUNTER — Ambulatory Visit (INDEPENDENT_AMBULATORY_CARE_PROVIDER_SITE_OTHER): Payer: Self-pay | Admitting: *Deleted

## 2022-02-04 VITALS — BP 79/54 | HR 105

## 2022-02-04 DIAGNOSIS — Z951 Presence of aortocoronary bypass graft: Secondary | ICD-10-CM

## 2022-02-04 DIAGNOSIS — Z4802 Encounter for removal of sutures: Secondary | ICD-10-CM

## 2022-02-04 LAB — BASIC METABOLIC PANEL
BUN: 21 mg/dL (ref 7–25)
CO2: 26 mmol/L (ref 20–32)
Calcium: 10.2 mg/dL (ref 8.6–10.3)
Chloride: 102 mmol/L (ref 98–110)
Creat: 0.91 mg/dL (ref 0.70–1.28)
Glucose, Bld: 107 mg/dL — ABNORMAL HIGH (ref 65–99)
Potassium: 4.8 mmol/L (ref 3.5–5.3)
Sodium: 137 mmol/L (ref 135–146)

## 2022-02-04 LAB — CBC WITH DIFFERENTIAL/PLATELET
Absolute Monocytes: 1203 cells/uL — ABNORMAL HIGH (ref 200–950)
Basophils Absolute: 96 cells/uL (ref 0–200)
Basophils Relative: 0.5 %
Eosinophils Absolute: 153 cells/uL (ref 15–500)
Eosinophils Relative: 0.8 %
HCT: 37 % — ABNORMAL LOW (ref 38.5–50.0)
Hemoglobin: 12.5 g/dL — ABNORMAL LOW (ref 13.2–17.1)
Lymphs Abs: 1509 cells/uL (ref 850–3900)
MCH: 33 pg (ref 27.0–33.0)
MCHC: 33.8 g/dL (ref 32.0–36.0)
MCV: 97.6 fL (ref 80.0–100.0)
MPV: 9.6 fL (ref 7.5–12.5)
Monocytes Relative: 6.3 %
Neutro Abs: 16140 cells/uL — ABNORMAL HIGH (ref 1500–7800)
Neutrophils Relative %: 84.5 %
Platelets: 488 10*3/uL — ABNORMAL HIGH (ref 140–400)
RBC: 3.79 10*6/uL — ABNORMAL LOW (ref 4.20–5.80)
RDW: 14 % (ref 11.0–15.0)
Total Lymphocyte: 7.9 %
WBC: 19.1 10*3/uL — ABNORMAL HIGH (ref 3.8–10.8)

## 2022-02-04 NOTE — Progress Notes (Signed)
Patient arrived for nurse visit to remove staples post-CABG 5/24 by Dr. Donata Clay.  Every other staple removed with no signs or symptoms of infection noted. Prior to staple removal patient became diaphoretic, clammy, an un responsive to name. Patient's head laid back and legs elevated. Recorded BP's 79/54 HR 105. Patient provided peanut butter crackers. BP increased to 113/64. Dr. Donata Clay assessed patient. Advised patient to hold Carvedilol today and tomorrow and then to resume at half dose BID thereafter, take Amiodarone once daily, and to discontinue taking [place] until next appt. CBC and BMET ordered per Dr. Donata Clay. BP upon leaving appt 116/74. Patient advised to keep incisions clean and dry. Patient to follow up for further staple removal in one week.

## 2022-02-04 NOTE — Progress Notes (Addendum)
HEART & VASCULAR TRANSITION OF CARE CONSULT NOTE     Referring Physician: Primary Care: Primary Cardiologist:  HPI: Referred to clinic by *** for heart failure consultation. 81 y.o. male with recently diagnosed CAD and b/l varicose veins. He is a nondiabetic and nonsmoker. Had exertional CP and syncopal episode in church prompting evaluation by Cardiology in [place]. Echo at [facility] with EF 35% and inferior hypokinesis. Coronary calcium score 700. Subsequently had LHC which demonstrated 3 v CAD including LM disease. Echo at [facility] prior to CABG: EF < 20%, RV moderately reduced. He underwent CABG X 4 01/23/22. Patient had expected POBLA and required transfusion. He did have atrial fibrillation and converted to NSR with IV amiodarone >> later switched to PO. PVCs also noted on tele. Diuresed with lasix and metolazone. Discharged on Amiodarone 200 BID (plan to decrease ***), Coreg 6.25 BID, Farxiga 10 mg daily, Furosemide 20 mg daily X 5 days then stop.   Seen in CT surgery 06/05 for suture removal. Became clammy and diaphoretic and had episode of decreased responsiveness. He was hypotensive with BP ***. Coreg held *** then given instructions to restart at 3.215 BID and stop [place]. Started on abx.  He is here today for hospital follow-up.   Cardiac Testing    Review of Systems: [y] = yes, [ ]  = no   General: Weight gain [ ] ; Weight loss [ ] ; Anorexia [ ] ; Fatigue [ ] ; Fever [ ] ; Chills [ ] ; Weakness [ ]   Cardiac: Chest pain/pressure [ ] ; Resting SOB [ ] ; Exertional SOB [ ] ; Orthopnea [ ] ; Pedal Edema [ ] ; Palpitations [ ] ; Syncope [ ] ; Presyncope [ ] ; Paroxysmal nocturnal dyspnea[ ]   Pulmonary: Cough [ ] ; Wheezing[ ] ; Hemoptysis[ ] ; Sputum [ ] ; Snoring [ ]   GI: Vomiting[ ] ; Dysphagia[ ] ; Melena[ ] ; Hematochezia [ ] ; Heartburn[ ] ; Abdominal pain [ ] ; Constipation [ ] ; Diarrhea [ ] ; BRBPR [ ]   GU: Hematuria[ ] ; Dysuria [ ] ; Nocturia[ ]   Vascular: Pain in legs with walking [ ] ; Pain in feet  with lying flat [ ] ; Non-healing sores [ ] ; Stroke [ ] ; TIA [ ] ; Slurred speech [ ] ;  Neuro: Headaches[ ] ; Vertigo[ ] ; Seizures[ ] ; Paresthesias[ ] ;Blurred vision [ ] ; Diplopia [ ] ; Vision changes [ ]   Ortho/Skin: Arthritis [ ] ; Joint pain [ ] ; Muscle pain [ ] ; Joint swelling [ ] ; Back Pain [ ] ; Rash [ ]   Psych: Depression[ ] ; Anxiety[ ]   Heme: Bleeding problems [ ] ; Clotting disorders [ ] ; Anemia [ ]   Endocrine: Diabetes [ ] ; Thyroid dysfunction[ ]    Past Medical History:  Diagnosis Date   Chronic GERD    Coronary artery disease    Frequent PVCs    Hyperlipidemia    Myocardial infarction (HCC)    RBBB (right bundle branch block with left anterior fascicular block)    Syncope and collapse    TIA (transient ischemic attack)     Current Outpatient Medications  Medication Sig Dispense Refill   amiodarone (PACERONE) 200 MG tablet Take 1 tablet (200 mg total) by mouth 2 (two) times daily. 60 tablet 1   aspirin EC 325 MG tablet Take 1 tablet (325 mg total) by mouth daily.     carvedilol (COREG) 6.25 MG tablet Take 1 tablet (6.25 mg total) by mouth 2 (two) times daily with a meal. 60 tablet 1   dapagliflozin propanediol (FARXIGA) 10 MG TABS tablet Take 1 tablet (10 mg total) by mouth daily. 30 tablet  1   famotidine (PEPCID) 40 MG tablet Take 40 mg by mouth daily.     furosemide (LASIX) 20 MG tablet Take 1 tablet (20 mg total) by mouth daily. For 5 days then stop. 5 tablet 0   isosorbide mononitrate (IMDUR) 30 MG 24 hr tablet Take 0.5 tablets (15 mg total) by mouth daily. 15 tablet 0   multivitamin-lutein (OCUVITE-LUTEIN) CAPS capsule Take 1 capsule by mouth 2 (two) times daily.     Omega-3 Fatty Acids (FISH OIL) 1200 MG CAPS Take 1,200 mg by mouth daily. (Patient not taking: Reported on 01/16/2022)     potassium chloride (KLOR-CON M) 10 MEQ tablet Take 1 tablet (10 mEq total) by mouth daily. For 5 days then stop 5 tablet 0   rosuvastatin (CRESTOR) 10 MG tablet Take 1 tablet (10 mg total) by  mouth daily. 90 tablet 3   traMADol (ULTRAM) 50 MG tablet Take 1 tablet (50 mg total) by mouth every 6 (six) hours as needed for moderate pain. 28 tablet 0   No current facility-administered medications for this visit.    No Known Allergies    Social History   Socioeconomic History   Marital status: Widowed    Spouse name: Not on file   Number of children: Not on file   Years of education: Not on file   Highest education level: Not on file  Occupational History   Not on file  Tobacco Use   Smoking status: Never   Smokeless tobacco: Never  Vaping Use   Vaping Use: Never used  Substance and Sexual Activity   Alcohol use: Never   Drug use: Never   Sexual activity: Not on file  Other Topics Concern   Not on file  Social History Narrative   Not on file   Social Determinants of Health   Financial Resource Strain: Not on file  Food Insecurity: Not on file  Transportation Needs: Not on file  Physical Activity: Not on file  Stress: Not on file  Social Connections: Not on file  Intimate Partner Violence: Not on file      Family History  Problem Relation Age of Onset   Heart attack Mother    Heart disease Mother    Stomach cancer Maternal Grandfather     There were no vitals filed for this visit.  PHYSICAL EXAM: General:  Well appearing. No respiratory difficulty HEENT: normal Neck: supple. no JVD. Carotids 2+ bilat; no bruits. No lymphadenopathy or thryomegaly appreciated. Cor: PMI nondisplaced. Regular rate & rhythm. No rubs, gallops or murmurs. Lungs: clear Abdomen: soft, nontender, nondistended. No hepatosplenomegaly. No bruits or masses. Good bowel sounds. Extremities: no cyanosis, clubbing, rash, edema Neuro: alert & oriented x 3, cranial nerves grossly intact. moves all 4 extremities w/o difficulty. Affect pleasant.  ECG:   ASSESSMENT & PLAN: Chronic systolic CHF/iCM:  2. CAD:  3. Post-operative atrial fibrillation:  4. PVCs:     NYHA  *** GDMT  Diuretic- BB- Ace/ARB/ARNI MRA SGLT2i    Referred to HFSW (PCP, Medications, Transportation, ETOH Abuse, Drug Abuse, Insurance, Financial ): Yes or No Refer to Pharmacy: Yes or No Refer to Home Health: Yes on No Refer to Advanced Heart Failure Clinic: Yes or no  Refer to General Cardiology: Yes or No  Follow up

## 2022-02-05 ENCOUNTER — Other Ambulatory Visit: Payer: Self-pay | Admitting: *Deleted

## 2022-02-05 ENCOUNTER — Other Ambulatory Visit: Payer: Self-pay | Admitting: Cardiothoracic Surgery

## 2022-02-05 ENCOUNTER — Telehealth: Payer: Self-pay

## 2022-02-05 DIAGNOSIS — Z951 Presence of aortocoronary bypass graft: Secondary | ICD-10-CM

## 2022-02-05 MED ORDER — CEPHALEXIN 500 MG PO CAPS: 500.0000 mg | ORAL_CAPSULE | Freq: Three times a day (TID) | ORAL | 0 refills | Status: DC

## 2022-02-05 NOTE — Telephone Encounter (Signed)
[place], [place] with [facility] [phone] contacted the office requesting verbal orders for HHPT. Evaluation complete and requested 1x/week for 4weeks. Verbal orders given.

## 2022-02-05 NOTE — Progress Notes (Signed)
Per Dr. Donata Clay, Keflex 500mg  sent to [facility] in [place] per daughter, TUMBY BAY. Boyd Kerbs made aware of UA/urine culture that needs to be done today. Aware of appt with cxr on Thursday.

## 2022-02-06 ENCOUNTER — Emergency Department (HOSPITAL_COMMUNITY): Payer: Medicare Other

## 2022-02-06 ENCOUNTER — Encounter (HOSPITAL_COMMUNITY): Payer: Self-pay

## 2022-02-06 ENCOUNTER — Observation Stay (HOSPITAL_COMMUNITY): Payer: Medicare Other

## 2022-02-06 ENCOUNTER — Other Ambulatory Visit: Payer: Self-pay

## 2022-02-06 ENCOUNTER — Telehealth (HOSPITAL_COMMUNITY): Payer: Self-pay | Admitting: *Deleted

## 2022-02-06 ENCOUNTER — Ambulatory Visit (HOSPITAL_BASED_OUTPATIENT_CLINIC_OR_DEPARTMENT_OTHER)
Admit: 2022-02-06 | Discharge: 2022-02-06 | Disposition: A | Discharge status: Home / Self Care | Payer: Medicare Other | Attending: Physician Assistant | Admitting: Physician Assistant

## 2022-02-06 ENCOUNTER — Inpatient Hospital Stay (HOSPITAL_COMMUNITY)
Admission: EM | Admit: 2022-02-06 | Discharge: 2022-02-09 | DRG: 315 | Disposition: A | Discharge status: Home Health | Payer: Medicare Other | Source: Ambulatory Visit | Attending: Interventional Cardiology | Admitting: Interventional Cardiology

## 2022-02-06 VITALS — BP 102/68 | HR 95 | Wt 159.4 lb

## 2022-02-06 DIAGNOSIS — I959 Hypotension, unspecified: Principal | ICD-10-CM | POA: Diagnosis present

## 2022-02-06 DIAGNOSIS — I5022 Chronic systolic (congestive) heart failure: Secondary | ICD-10-CM

## 2022-02-06 DIAGNOSIS — I472 Ventricular tachycardia, unspecified: Secondary | ICD-10-CM

## 2022-02-06 DIAGNOSIS — Z8673 Personal history of transient ischemic attack (TIA), and cerebral infarction without residual deficits: Secondary | ICD-10-CM | POA: Diagnosis not present

## 2022-02-06 DIAGNOSIS — I255 Ischemic cardiomyopathy: Secondary | ICD-10-CM | POA: Diagnosis not present

## 2022-02-06 DIAGNOSIS — I252 Old myocardial infarction: Secondary | ICD-10-CM

## 2022-02-06 DIAGNOSIS — R55 Syncope and collapse: Secondary | ICD-10-CM

## 2022-02-06 DIAGNOSIS — E785 Hyperlipidemia, unspecified: Secondary | ICD-10-CM | POA: Diagnosis present

## 2022-02-06 DIAGNOSIS — I110 Hypertensive heart disease with heart failure: Secondary | ICD-10-CM | POA: Diagnosis not present

## 2022-02-06 DIAGNOSIS — Z8249 Family history of ischemic heart disease and other diseases of the circulatory system: Secondary | ICD-10-CM

## 2022-02-06 DIAGNOSIS — I2510 Atherosclerotic heart disease of native coronary artery without angina pectoris: Secondary | ICD-10-CM

## 2022-02-06 DIAGNOSIS — Z7982 Long term (current) use of aspirin: Secondary | ICD-10-CM

## 2022-02-06 DIAGNOSIS — I493 Ventricular premature depolarization: Secondary | ICD-10-CM

## 2022-02-06 DIAGNOSIS — I4891 Unspecified atrial fibrillation: Secondary | ICD-10-CM

## 2022-02-06 DIAGNOSIS — I2582 Chronic total occlusion of coronary artery: Secondary | ICD-10-CM | POA: Diagnosis present

## 2022-02-06 DIAGNOSIS — D62 Acute posthemorrhagic anemia: Secondary | ICD-10-CM | POA: Diagnosis not present

## 2022-02-06 DIAGNOSIS — Z951 Presence of aortocoronary bypass graft: Secondary | ICD-10-CM | POA: Diagnosis not present

## 2022-02-06 DIAGNOSIS — I4510 Unspecified right bundle-branch block: Secondary | ICD-10-CM | POA: Insufficient documentation

## 2022-02-06 DIAGNOSIS — I6381 Other cerebral infarction due to occlusion or stenosis of small artery: Secondary | ICD-10-CM | POA: Diagnosis not present

## 2022-02-06 DIAGNOSIS — I5023 Acute on chronic systolic (congestive) heart failure: Secondary | ICD-10-CM

## 2022-02-06 DIAGNOSIS — I9789 Other postprocedural complications and disorders of the circulatory system, not elsewhere classified: Secondary | ICD-10-CM

## 2022-02-06 DIAGNOSIS — T502X5A Adverse effect of carbonic-anhydrase inhibitors, benzothiadiazides and other diuretics, initial encounter: Secondary | ICD-10-CM | POA: Diagnosis present

## 2022-02-06 DIAGNOSIS — I3139 Other pericardial effusion (noninflammatory): Secondary | ICD-10-CM | POA: Diagnosis present

## 2022-02-06 DIAGNOSIS — I509 Heart failure, unspecified: Secondary | ICD-10-CM

## 2022-02-06 DIAGNOSIS — G930 Cerebral cysts: Secondary | ICD-10-CM | POA: Diagnosis not present

## 2022-02-06 DIAGNOSIS — I952 Hypotension due to drugs: Secondary | ICD-10-CM | POA: Diagnosis not present

## 2022-02-06 DIAGNOSIS — N390 Urinary tract infection, site not specified: Secondary | ICD-10-CM | POA: Diagnosis not present

## 2022-02-06 DIAGNOSIS — J9811 Atelectasis: Secondary | ICD-10-CM | POA: Diagnosis not present

## 2022-02-06 DIAGNOSIS — Z800 Family history of malignant neoplasm of digestive organs: Secondary | ICD-10-CM | POA: Diagnosis not present

## 2022-02-06 DIAGNOSIS — K219 Gastro-esophageal reflux disease without esophagitis: Secondary | ICD-10-CM | POA: Diagnosis present

## 2022-02-06 DIAGNOSIS — J90 Pleural effusion, not elsewhere classified: Secondary | ICD-10-CM | POA: Diagnosis not present

## 2022-02-06 DIAGNOSIS — R0989 Other specified symptoms and signs involving the circulatory and respiratory systems: Secondary | ICD-10-CM | POA: Diagnosis not present

## 2022-02-06 DIAGNOSIS — I6782 Cerebral ischemia: Secondary | ICD-10-CM | POA: Diagnosis not present

## 2022-02-06 DIAGNOSIS — E860 Dehydration: Secondary | ICD-10-CM | POA: Diagnosis not present

## 2022-02-06 DIAGNOSIS — R5381 Other malaise: Secondary | ICD-10-CM | POA: Diagnosis present

## 2022-02-06 DIAGNOSIS — Z79899 Other long term (current) drug therapy: Secondary | ICD-10-CM

## 2022-02-06 DIAGNOSIS — R4189 Other symptoms and signs involving cognitive functions and awareness: Secondary | ICD-10-CM

## 2022-02-06 HISTORY — DX: Syncope and collapse: R55

## 2022-02-06 LAB — CBC WITH DIFFERENTIAL/PLATELET
Abs Immature Granulocytes: 0.12 10*3/uL — ABNORMAL HIGH (ref 0.00–0.07)
Basophils Absolute: 0.1 10*3/uL (ref 0.0–0.1)
Basophils Relative: 1 %
Eosinophils Absolute: 0.1 10*3/uL (ref 0.0–0.5)
Eosinophils Relative: 0 %
HCT: 38.4 % — ABNORMAL LOW (ref 39.0–52.0)
Hemoglobin: 12.6 g/dL — ABNORMAL LOW (ref 13.0–17.0)
Immature Granulocytes: 1 %
Lymphocytes Relative: 12 %
Lymphs Abs: 2.1 10*3/uL (ref 0.7–4.0)
MCH: 33.1 pg (ref 26.0–34.0)
MCHC: 32.8 g/dL (ref 30.0–36.0)
MCV: 100.8 fL — ABNORMAL HIGH (ref 80.0–100.0)
Monocytes Absolute: 1.2 10*3/uL — ABNORMAL HIGH (ref 0.1–1.0)
Monocytes Relative: 7 %
Neutro Abs: 13.4 10*3/uL — ABNORMAL HIGH (ref 1.7–7.7)
Neutrophils Relative %: 79 %
Platelets: 456 10*3/uL — ABNORMAL HIGH (ref 150–400)
RBC: 3.81 MIL/uL — ABNORMAL LOW (ref 4.22–5.81)
RDW: 14.7 % (ref 11.5–15.5)
WBC: 17 10*3/uL — ABNORMAL HIGH (ref 4.0–10.5)
nRBC: 0 % (ref 0.0–0.2)

## 2022-02-06 LAB — BASIC METABOLIC PANEL
Anion gap: 8 (ref 5–15)
Anion gap: 7 (ref 5–15)
BUN: 15 mg/dL (ref 8–23)
BUN: 13 mg/dL (ref 8–23)
CO2: 24 mmol/L (ref 22–32)
CO2: 25 mmol/L (ref 22–32)
Calcium: 10 mg/dL (ref 8.9–10.3)
Calcium: 9.7 mg/dL (ref 8.9–10.3)
Chloride: 104 mmol/L (ref 98–111)
Chloride: 106 mmol/L (ref 98–111)
Creatinine, Ser: 0.98 mg/dL (ref 0.61–1.24)
Creatinine, Ser: 0.93 mg/dL (ref 0.61–1.24)
GFR, Estimated: >60 mL/min (ref >60)
GFR, Estimated: >60 mL/min (ref >60)
Glucose, Bld: 125 mg/dL — ABNORMAL HIGH (ref 70–99)
Glucose, Bld: 130 mg/dL — ABNORMAL HIGH (ref 70–99)
Potassium: 4.2 mmol/L (ref 3.5–5.1)
Potassium: 4.2 mmol/L (ref 3.5–5.1)
Sodium: 136 mmol/L (ref 135–145)
Sodium: 138 mmol/L (ref 135–145)

## 2022-02-06 LAB — TROPONIN I (HIGH SENSITIVITY)
Troponin I (High Sensitivity): 27 ng/L — ABNORMAL HIGH (ref <18)
Troponin I (High Sensitivity): 24 ng/L — ABNORMAL HIGH (ref <18)
Troponin I (High Sensitivity): 28 ng/L — ABNORMAL HIGH (ref <18)

## 2022-02-06 LAB — URINALYSIS, ROUTINE W REFLEX MICROSCOPIC
Bilirubin Urine: NEGATIVE
Glucose, UA: >=500 mg/dL — AB
Hgb urine dipstick: NEGATIVE
Ketones, ur: NEGATIVE mg/dL
Leukocytes,Ua: NEGATIVE
Nitrite: NEGATIVE
Protein, ur: NEGATIVE mg/dL
Specific Gravity, Urine: 1.011 (ref 1.005–1.030)
pH: 5 (ref 5.0–8.0)

## 2022-02-06 LAB — CBC
HCT: 34.4 % — ABNORMAL LOW (ref 39.0–52.0)
HCT: 34.8 % — ABNORMAL LOW (ref 39.0–52.0)
Hemoglobin: 11 g/dL — ABNORMAL LOW (ref 13.0–17.0)
Hemoglobin: 11.3 g/dL — ABNORMAL LOW (ref 13.0–17.0)
MCH: 33 pg (ref 26.0–34.0)
MCH: 32.8 pg (ref 26.0–34.0)
MCHC: 32 g/dL (ref 30.0–36.0)
MCHC: 32.5 g/dL (ref 30.0–36.0)
MCV: 103.3 fL — ABNORMAL HIGH (ref 80.0–100.0)
MCV: 100.9 fL — ABNORMAL HIGH (ref 80.0–100.0)
Platelets: 334 10*3/uL (ref 150–400)
Platelets: 381 10*3/uL (ref 150–400)
RBC: 3.33 MIL/uL — ABNORMAL LOW (ref 4.22–5.81)
RBC: 3.45 MIL/uL — ABNORMAL LOW (ref 4.22–5.81)
RDW: 14.7 % (ref 11.5–15.5)
RDW: 14.8 % (ref 11.5–15.5)
WBC: 15.1 10*3/uL — ABNORMAL HIGH (ref 4.0–10.5)
WBC: 14.9 10*3/uL — ABNORMAL HIGH (ref 4.0–10.5)
nRBC: 0 % (ref 0.0–0.2)
nRBC: 0 % (ref 0.0–0.2)

## 2022-02-06 LAB — ECHOCARDIOGRAM LIMITED
Height: 69 in
S' Lateral: 5.1 cm
Single Plane A4C EF: 20.5 %
Weight: 2528 oz

## 2022-02-06 LAB — CREATININE, SERUM
Creatinine, Ser: 0.82 mg/dL (ref 0.61–1.24)
GFR, Estimated: >60 mL/min (ref >60)

## 2022-02-06 LAB — MAGNESIUM: Magnesium: 2.4 mg/dL (ref 1.7–2.4)

## 2022-02-06 MED ORDER — OMEGA-3-ACID ETHYL ESTERS 1 G PO CAPS
1000.0000 mg | ORAL_CAPSULE | Freq: Every day | ORAL | Status: DC
  Administered 2022-02-07 – 2022-02-09 (×3): 1000 mg via ORAL
  Filled 2022-02-06 (×4): qty 1

## 2022-02-06 MED ORDER — AMIODARONE HCL 200 MG PO TABS: 200.0000 mg | ORAL_TABLET | Freq: Every day | ORAL | Status: DC

## 2022-02-06 MED ORDER — ONDANSETRON HCL 4 MG/2ML IJ SOLN: 4.0000 mg | Freq: Four times a day (QID) | INTRAMUSCULAR | Status: DC | PRN

## 2022-02-06 MED ORDER — ENSURE ENLIVE PO LIQD
237.0000 mL | Freq: Three times a day (TID) | ORAL | Status: DC
Start: 2022-02-07 — End: 2022-02-09
  Administered 2022-02-07 – 2022-02-09 (×5): 237 mL via ORAL
  Filled 2022-02-06 (×2): qty 237

## 2022-02-06 MED ORDER — AMIODARONE HCL 200 MG PO TABS
200.0000 mg | ORAL_TABLET | Freq: Every day | ORAL | Status: DC
  Administered 2022-02-07 – 2022-02-09 (×3): 200 mg via ORAL
  Filled 2022-02-06 (×3): qty 1

## 2022-02-06 MED ORDER — NITROGLYCERIN 0.4 MG SL SUBL: 0.4000 mg | SUBLINGUAL_TABLET | SUBLINGUAL | Status: DC | PRN

## 2022-02-06 MED ORDER — HEPARIN SODIUM (PORCINE) 5000 UNIT/ML IJ SOLN
5000.0000 [IU] | Freq: Three times a day (TID) | INTRAMUSCULAR | Status: DC
  Administered 2022-02-06 – 2022-02-09 (×8): 5000 [IU] via SUBCUTANEOUS
  Filled 2022-02-06 (×8): qty 1

## 2022-02-06 MED ORDER — TRAMADOL HCL 50 MG PO TABS: 50.0000 mg | ORAL_TABLET | Freq: Four times a day (QID) | ORAL | Status: DC | PRN

## 2022-02-06 MED ORDER — FAMOTIDINE 20 MG PO TABS
40.0000 mg | ORAL_TABLET | Freq: Every day | ORAL | Status: DC
  Administered 2022-02-07 – 2022-02-09 (×3): 40 mg via ORAL
  Filled 2022-02-06 (×4): qty 2

## 2022-02-06 MED ORDER — ACETAMINOPHEN 325 MG PO TABS: 650.0000 mg | ORAL_TABLET | ORAL | Status: DC | PRN

## 2022-02-06 MED ORDER — ROSUVASTATIN CALCIUM 5 MG PO TABS
10.0000 mg | ORAL_TABLET | Freq: Every day | ORAL | Status: DC
  Administered 2022-02-06 – 2022-02-09 (×4): 10 mg via ORAL
  Filled 2022-02-06 (×4): qty 2

## 2022-02-06 MED ORDER — PROSIGHT PO TABS
1.0000 | ORAL_TABLET | Freq: Two times a day (BID) | ORAL | Status: DC
  Administered 2022-02-07 – 2022-02-09 (×4): 1 via ORAL
  Filled 2022-02-06 (×6): qty 1

## 2022-02-06 MED ORDER — CEPHALEXIN 250 MG PO CAPS
500.0000 mg | ORAL_CAPSULE | Freq: Three times a day (TID) | ORAL | Status: DC
  Administered 2022-02-06: 500 mg via ORAL
  Filled 2022-02-06: qty 2

## 2022-02-06 MED ORDER — ASPIRIN 325 MG PO TABS
325.0000 mg | ORAL_TABLET | Freq: Every day | ORAL | Status: DC
  Administered 2022-02-07 – 2022-02-09 (×3): 325 mg via ORAL
  Filled 2022-02-06 (×3): qty 1

## 2022-02-06 MED ORDER — ASPIRIN 325 MG PO TBEC: 325.0000 mg | DELAYED_RELEASE_TABLET | Freq: Every day | ORAL | Status: DC

## 2022-02-06 MED ORDER — CEPHALEXIN 500 MG PO CAPS
500.0000 mg | ORAL_CAPSULE | Freq: Four times a day (QID) | ORAL | Status: DC
  Administered 2022-02-07 – 2022-02-09 (×11): 500 mg via ORAL
  Filled 2022-02-06: qty 1
  Filled 2022-02-06: qty 2
  Filled 2022-02-06: qty 1
  Filled 2022-02-06: qty 2
  Filled 2022-02-06 (×4): qty 1
  Filled 2022-02-06: qty 2
  Filled 2022-02-06: qty 1
  Filled 2022-02-06: qty 2
  Filled 2022-02-06: qty 1

## 2022-02-06 NOTE — H&P (Addendum)
The patient has been seen in conjunction with Roger Smith, PAC. All aspects of care have been considered and discussed. The patient has been personally interviewed, examined, and all clinical data has been reviewed.  The patient has had tendency towards low blood pressure over the past week.  The history is outlined below. Felt to have UTI and was started on antibiotics 24 hours ago. He is not currently febrile nor does he appear toxic. Exam reveals relatively flat neck veins with the patient lying at 20 degrees. All guideline directed therapy for systolic dysfunction have been discontinued because of hypotension.  This includes Lasix, SGLT2 agent, and Farxiga. Plan to repeat limited 2D Doppler echocardiogram to rule out pericardial effusion and to reassess LV function now 3 weeks post coronary bypass surgery. We will check labs to ensure there is no leukocytosis or evidence of sepsis.     Cardiology Admission History and Physical:   Patient ID: Roger Smith MRN: [id number]; DOB: 20-Mar-1941   Admission date: 02/06/2022  PCP:  Nicoletta Dress, MD   [facility] Providers Cardiologist:  Jenne Campus, MD      Chief Complaint:  Syncope  Patient Profile:   Roger Smith is a 81 y.o. 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  Patient had exertional chest pain and syncope leading to admission at [facility] 10/2021.  Echo with EF of 35% with wall motion abnormality.  Follow-up coronary CT was abnormal.  Subsequently underwent cardiac catheterization showing multivessel CAD.  Echocardiogram 5/22 with LV function less than [id number], grade 2 diastolic dysfunction.  He underwent CABG x4 on 01/23/2022.  Had postop atrial fibrillation and converted to sinus rhythm on IV amiodarone.  He was discharged on p.o. amiodarone, Coreg, Farxiga and furosemide for few days.  Monitor 12/2021 Summary and  conclusions: Multiple episodes of ventricular tachycardia noted total of 96 with longest episode 12.8 seconds. 25 episode of supraventricular tachycardia. Total burden of ventricular ectopy 1.8%  On Saturday patient had episode of hypotension with systolic blood pressure in 70s at home.  Lasix discontinued.  Patient with episode of syncope with hypotension at 79/54 during suture removal at [facility] office Monday.  Advised to hold Coreg x 2 days than resume at 1/2 dose and stop her [place].  Amiodarone reduced to daily.  Started on antibiotic for possible UTI.  History of Present Illness:   Mr. Roger Smith seen in heart failure clinic this morning and noted unresponsive and unable to follow commands.  Patient was waiting for provider to come in.  Daughter was in room.  She did not notice anything abnormal.  Patient was not answering question to Jeneen Rinks, NP. Sinus rhythm on monitor.  Systolic blood pressure of 90 Doppler monitor.  He was given bolus of normal saline and transported to emergency room.  Denies any chest pain, shortness of breath, palpitation, dizziness, orthopnea, PND or melena.  Has right lower extremity edema.  Creatinine and potassium are normal High-sensitivity troponin 27 Hemoglobin 12.6 WBC 17 Chest x-ray LEFT basilar atelectasis and small LEFT pleural effusion.   Past Medical History:  Diagnosis Date   Chronic GERD    Coronary artery disease    Frequent PVCs    Hyperlipidemia    Myocardial infarction (HCC)    RBBB (right bundle branch block with left anterior fascicular block)    Syncope and collapse    TIA (transient ischemic attack)  Past Surgical History:  Procedure Laterality Date   CATARACT EXTRACTION Bilateral 2015   CORONARY ARTERY BYPASS GRAFT N/A 01/23/2022   Procedure: CORONARY ARTERY BYPASS GRAFTING (CABG) X4 USING LEFT INTERNAL MAMMARY ARTERY, LEFT RADIAL ARTERY, AND RIGHT GREATER SAPHENOUS VEIN HARVESTED ENDOSCOPICALLY;  Surgeon: Dahlia Byes,  MD;  Location: [place];  Service: Open Heart Surgery;  Laterality: N/A;   INGUINAL HERNIA REPAIR Right 2005   INGUINAL HERNIA REPAIR Left 2017   Dr. Pauletta Browns   LEFT HEART CATH AND CORONARY ANGIOGRAPHY N/A 01/02/2022   Procedure: LEFT HEART CATH AND CORONARY ANGIOGRAPHY;  Surgeon: Burnell Blanks, MD;  Location: [place] CV [facility];  Service: Cardiovascular;  Laterality: N/A;   RADIAL ARTERY HARVEST Left 01/23/2022   Procedure: RADIAL ARTERY HARVEST;  Surgeon: Dahlia Byes, MD;  Location: [place];  Service: Open Heart Surgery;  Laterality: Left;   TEE WITHOUT CARDIOVERSION N/A 01/23/2022   Procedure: TRANSESOPHAGEAL ECHOCARDIOGRAM (TEE);  Surgeon: Dahlia Byes, MD;  Location: [place];  Service: Open Heart Surgery;  Laterality: N/A;     Medications Prior to Admission: Prior to Admission medications   Medication Sig Start Date End Date Taking? Authorizing Provider  amiodarone (PACERONE) 200 MG tablet Take 200 mg by mouth daily.    [provider]  aspirin EC 325 MG tablet Take 1 tablet (325 mg total) by mouth daily. 01/29/22   Nani Skillern, PA-C  carvedilol (COREG) 6.25 MG tablet Take 3.125 mg by mouth 2 (two) times daily with a meal.    [provider]  cephALEXin (KEFLEX) 500 MG capsule Take 1 capsule (500 mg total) by mouth 3 (three) times daily. 02/05/22   Dahlia Byes, MD  famotidine (PEPCID) 40 MG tablet Take 40 mg by mouth daily. 11/14/21   [provider]  isosorbide mononitrate (IMDUR) 30 MG 24 hr tablet Take 0.5 tablets (15 mg total) by mouth daily. 01/29/22   Lars Pinks M, PA-C  multivitamin-lutein (OCUVITE-LUTEIN) CAPS capsule Take 1 capsule by mouth 2 (two) times daily.    [provider]  Omega-3 Fatty Acids (FISH OIL) 1200 MG CAPS Take 1,200 mg by mouth daily.    [provider]  rosuvastatin (CRESTOR) 10 MG tablet Take 1 tablet (10 mg total) by mouth daily. 12/06/21   Park Liter, MD  traMADol (ULTRAM) 50 MG  tablet Take 1 tablet (50 mg total) by mouth every 6 (six) hours as needed for moderate pain. 01/29/22   Nani Skillern, PA-C     Allergies:   No Known Allergies  Social History:   Social History   Socioeconomic History   Marital status: Widowed    Spouse name: Not on file   Number of children: Not on file   Years of education: Not on file   Highest education level: Not on file  Occupational History   Not on file  Tobacco Use   Smoking status: Never   Smokeless tobacco: Never  Vaping Use   Vaping Use: Never used  Substance and Sexual Activity   Alcohol use: Never   Drug use: Never   Sexual activity: Not on file  Other Topics Concern   Not on file  Social History Narrative   Not on file   Social Determinants of Health   Financial Resource Strain: Not on file  Food Insecurity: Not on file  Transportation Needs: Not on file  Physical Activity: Not on file  Stress: Not on file  Social Connections: Not on file  Intimate Partner  Violence: Not on file    Family History:  The patient's family history includes Heart attack in his mother; Heart disease in his mother; Stomach cancer in his maternal grandfather.    ROS:  Please see the history of present illness.  All other ROS reviewed and negative.     Physical Exam/Data:   Vitals:   02/06/22 1315 02/06/22 1330 02/06/22 1345 02/06/22 1415  BP: 131/70 127/71 125/72 126/77  Pulse: 90 92 94 95  Resp: 17 20 18  (!) 23  Temp:      TempSrc:      SpO2: 95% 96% 96% 94%  Weight:      Height:       No intake or output data in the 24 hours ending 02/06/22 1454    02/06/2022   12:52 PM 02/06/2022   11:49 AM 01/29/2022    4:00 AM  Last 3 Weights  Weight (lbs) 158 lb 159 lb 6 oz 164 lb 7.4 oz  Weight (kg) 71.668 kg 72.292 kg 74.6 kg     Body mass index is 23.33 kg/m.  General:  Well nourished, well developed, in no acute distress HEENT: normal Neck: no JVD Vascular: No carotid bruits; Distal pulses 2+ bilaterally    Cardiac:  normal S1, S2; RRR; no murmur  Lungs:  clear to auscultation bilaterally, no wheezing, rhonchi or rales  Abd: soft, nontender, no hepatomegaly  Ext: Trace to 1+ right lower extremity edema Musculoskeletal:  No deformities, BUE and BLE strength normal and equal Skin: warm and dry  Neuro:  CNs 2-12 intact, no focal abnormalities noted Psych:  Normal affect    EKG:  The ECG that was done today  was personally reviewed and demonstrates SR, PVCs, RBBB  Relevant CV Studies:  Echo 01/21/22 1. Left ventricular ejection fraction, by estimation, is <20%. The left  ventricle has severely decreased function. The left ventricle demonstrates  global hypokinesis. The left ventricular internal cavity size was  moderately dilated. Left ventricular  diastolic parameters are consistent with Grade II diastolic dysfunction  (pseudonormalization). Elevated left atrial pressure.   2. Right ventricular systolic function is moderately reduced. The right  ventricular size is normal. There is mildly elevated pulmonary artery  systolic pressure.   3. Left atrial size was mildly dilated.   4. The mitral valve is normal in structure. Mild mitral valve  regurgitation.   5. The aortic valve is tricuspid. Aortic valve regurgitation is not  visualized. Aortic valve sclerosis/calcification is present, without any  evidence of aortic stenosis.   6. The inferior vena cava is normal in size with greater than 50%  respiratory variability, suggesting right atrial pressure of 3 mmHg.  LEFT HEART CATH AND CORONARY ANGIOGRAPHY  01/02/22   Conclusion      Prox RCA lesion is 99% stenosed.   Mid RCA lesion is 40% stenosed.   Ost Cx to Prox Cx lesion is 99% stenosed.   Mid LM to Dist LM lesion is 50% stenosed.   2nd Diag lesion is 60% stenosed.   Mid LAD lesion is 100% stenosed.   Ost LAD to Prox LAD lesion is 70% stenosed.   Severe three vessel CAD. Moderate distal left main stenosis Chronic total  occlusion mid LAD. The mid and distal vessel fills from collaterals from the RCA and Circumflex. The large diagonal branch has severe stenosis The Circumflex is a large caliber vessel with severe ostial/proximal stenosis The RCA is a large dominant vessel with severe proximal stenosis.  Recommendations: He has severe, three vessel CAD involving the left main, ostial Circumflex and proximal RCA with CTO of the mid LAD. The best option for complete revascularization will be CABG. He has no symptoms currently. I will make an outpatient referral to CT surgery to discuss CABG.     Laboratory Data:  High Sensitivity Troponin:   Recent Labs  Lab 02/06/22 1230  TROPONINIHS 27*      Chemistry Recent Labs  Lab 02/04/22 1407 02/06/22 1230  NA 137 136  K 4.8 4.2  CL 102 104  CO2 26 24  GLUCOSE 107* 125*  BUN 21 15  CREATININE 0.91 0.98  CALCIUM 10.2 10.0  MG  --  2.4  GFRNONAA  --  >60  ANIONGAP  --  8    Hematology Recent Labs  Lab 02/04/22 1407 02/06/22 1230  WBC 19.1* 17.0*  RBC 3.79* 3.81*  HGB 12.5* 12.6*  HCT 37.0* 38.4*  MCV 97.6 100.8*  MCH 33.0 33.1  MCHC 33.8 32.8  RDW 14.0 14.7  PLT 488* 456*   Radiology/Studies:  DG Chest Portable 1 View  Result Date: 02/06/2022 CLINICAL DATA:  Syncope EXAM: PORTABLE CHEST 1 VIEW COMPARISON:  Portable exam 1327 hours compared to 01/29/2022 FINDINGS: Borderline enlargement of cardiac silhouette post CABG. Mediastinal contours and pulmonary vascularity normal. Atherosclerotic calcification aorta. Mild LEFT basilar atelectasis and small pleural effusion. Scarring LEFT upper lobe stable. No acute infiltrate or pneumothorax. Bones demineralized. IMPRESSION: LEFT basilar atelectasis and small LEFT pleural effusion. Electronically Signed   By: Lavonia Dana M.D.   On: 02/06/2022 13:48     Assessment and Plan:   Decreased responsiveness -Associated with diaphoresis and hypotension.  Monday and today daughter was present during episode.   No seizure-like activity.  He was noted hypotensive both times.  Patient was asymptomatic. -Admit for observation -Given low EF patient will need LifeVest at discharge and ZIO monitor -Cycle troponin -Check orthostatic vital -We will get PT  2.  Ischemic cardiomyopathy -EF was 30% on 11/29/[facility] at [facility] with wall motion abnormality -Echo 01/21/22 with EF < 20% -Does not appear volume overloaded -Chest x-ray with small left pleural effusion -Given hypotension with syncope, recommended to hold all cardiac directed medical therapy  3.  CAD s/p CABG x4 -Continue aspirin and statin  4.  Postop atrial fibrillation -Converted to sinus rhythm on IV amiodarone.  He was discharged on amiodarone 200 mg daily.  If recurrent episode of atrial fibrillation, he will need anticoagulation  5.  PVC/VT -Monitor 12/2021: Multiple episodes of ventricular tachycardia noted total of 96 with longest episode 12.8 seconds. 25 episode of supraventricular tachycardia. Total burden of ventricular ectopy 1.8% - Monitor on tele - Keep K > 4 and Mg > 2  Risk Assessment/Risk Scores:   New York Heart Association (NYHA) Functional Class NYHA Class II  Severity of Illness: The appropriate patient status for this patient is OBSERVATION. Observation status is judged to be reasonable and necessary in order to provide the required intensity of service to ensure the patient's safety. The patient's presenting symptoms, physical exam findings, and initial radiographic and laboratory data in the context of their medical condition is felt to place them at decreased risk for further clinical deterioration. Furthermore, it is anticipated that the patient will be medically stable for discharge from the hospital within 2 midnights of admission.    For questions or updates, please contact [place] Please consult [email or web address] for contact info under     Signed, [employer]  [place], [place]  02/06/2022 2:54 PM

## 2022-02-06 NOTE — ED Provider Notes (Signed)
[place] EMERGENCY DEPARTMENT Provider Note   CSN: [id number] Arrival date & time: 02/06/22  1239     History  CC: Syncope   Roger Smith is a 81 y.o. 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  Supplement is provided by the patient's daughter and EMS.  The patient was at the cardiology clinic today, where he was noted to be pale and diaphoretic, had a systolic pressure of 90, blood sugar 75.  Is unclear if he had true loss of consciousness in the office.  EMS was called and subsequently brought the patient to the ED, given 1 L fluid.  The patient feels significantly better on arrival, denies lightheadedness, headache, chest pain or pressure.  He says he feels fine.  His daughter reports he had another very brief episode similarly of near syncope while he was in the CT surgeons office 2 days ago.  He was diagnosed with a possible UTI at that time started on Keflex, which he has taken for 2 days.  He denies dysuria.  TEE 01/23/22 showing EF 20-30%, diffuse left ventricular hypokinesis.  CT surgeon Dr Nils Pyle  HPI     Home Medications Prior to Admission medications   Medication Sig Start Date End Date Taking? Authorizing Provider  amiodarone (PACERONE) 200 MG tablet Take 200 mg by mouth daily.    [provider]  aspirin EC 325 MG tablet Take 1 tablet (325 mg total) by mouth daily. 01/29/22   Nani Skillern, PA-C  carvedilol (COREG) 6.25 MG tablet Take 3.125 mg by mouth 2 (two) times daily with a meal.    [provider]  cephALEXin (KEFLEX) 500 MG capsule Take 1 capsule (500 mg total) by mouth 3 (three) times daily. 02/05/22   Dahlia Byes, MD  famotidine (PEPCID) 40 MG tablet Take 40 mg by mouth daily. 11/14/21   [provider]  isosorbide mononitrate (IMDUR) 30 MG 24 hr tablet Take 0.5 tablets (15 mg total)  by mouth daily. 01/29/22   Lars Pinks M, PA-C  multivitamin-lutein (OCUVITE-LUTEIN) CAPS capsule Take 1 capsule by mouth 2 (two) times daily.    [provider]  Omega-3 Fatty Acids (FISH OIL) 1200 MG CAPS Take 1,200 mg by mouth daily.    [provider]  rosuvastatin (CRESTOR) 10 MG tablet Take 1 tablet (10 mg total) by mouth daily. 12/06/21   Park Liter, MD  traMADol (ULTRAM) 50 MG tablet Take 1 tablet (50 mg total) by mouth every 6 (six) hours as needed for moderate pain. 01/29/22   Nani Skillern, PA-C      Allergies    Patient has no known allergies.    Review of Systems   Review of Systems  Physical Exam Updated Vital Signs BP 114/72   Pulse 93   Temp 98.1 F (36.7 C) (Oral)   Resp 19   Ht 5\' 9"  (1.753 m)   Wt 71.7 kg   SpO2 95%   BMI 23.33 kg/m  Physical Exam Constitutional:      General: He is not in acute distress. HENT:     Head: Normocephalic and atraumatic.  Eyes:     Conjunctiva/sclera: Conjunctivae normal.     Pupils: Pupils are equal, round, and reactive to light.  Cardiovascular:     Rate and Rhythm: Normal rate and regular rhythm.     Pulses:  Normal pulses.  Pulmonary:     Effort: Pulmonary effort is normal. No respiratory distress.  Abdominal:     General: There is no distension.     Tenderness: There is no abdominal tenderness.  Skin:    General: Skin is warm and dry.  Neurological:     General: No focal deficit present.     Mental Status: He is alert and oriented to person, place, and time. Mental status is at baseline.    ED Results / Procedures / Treatments   Labs (all labs ordered are listed, but only abnormal results are displayed) Labs Reviewed  BASIC METABOLIC PANEL - Abnormal; Notable for the following components:      Result Value   Glucose, Bld 125 (*)    All other components within normal limits  CBC WITH DIFFERENTIAL/PLATELET - Abnormal; Notable for the following components:   WBC 17.0 (*)     RBC 3.81 (*)    Hemoglobin 12.6 (*)    HCT 38.4 (*)    MCV 100.8 (*)    Platelets 456 (*)    Neutro Abs 13.4 (*)    Monocytes Absolute 1.2 (*)    Abs Immature Granulocytes 0.12 (*)    All other components within normal limits  URINALYSIS, ROUTINE W REFLEX MICROSCOPIC - Abnormal; Notable for the following components:   APPearance HAZY (*)    Glucose, UA >=500 (*)    Bacteria, UA RARE (*)    All other components within normal limits  TROPONIN I (HIGH SENSITIVITY) - Abnormal; Notable for the following components:   Troponin I (High Sensitivity) 27 (*)    All other components within normal limits  MAGNESIUM  CBC  CREATININE, SERUM  TROPONIN I (HIGH SENSITIVITY)  TROPONIN I (HIGH SENSITIVITY)    EKG EKG Interpretation  Date/Time:  Wednesday February 06 2022 12:52:12 EDT Ventricular Rate:  93 PR Interval:  167 QRS Duration: 149 QT Interval:  410 QTC Calculation: 510 R Axis:   -63 Text Interpretation: Sinus rhythm Ventricular premature complex Right bundle branch block PVC's noted No sig change from prior tracing Confirmed by Octaviano Glow [phone]) on 02/06/2022 1:35:40 PM  Radiology DG Chest Portable 1 View  Result Date: 02/06/2022 CLINICAL DATA:  Syncope EXAM: PORTABLE CHEST 1 VIEW COMPARISON:  Portable exam 1327 hours compared to 01/29/2022 FINDINGS: Borderline enlargement of cardiac silhouette post CABG. Mediastinal contours and pulmonary vascularity normal. Atherosclerotic calcification aorta. Mild LEFT basilar atelectasis and small pleural effusion. Scarring LEFT upper lobe stable. No acute infiltrate or pneumothorax. Bones demineralized. IMPRESSION: LEFT basilar atelectasis and small LEFT pleural effusion. Electronically Signed   By: Lavonia Dana M.D.   On: 02/06/2022 13:48    Procedures Procedures    Medications Ordered in ED Medications  traMADol (ULTRAM) tablet 50 mg (has no administration in time range)  cephALEXin (KEFLEX) capsule 500 mg (has no administration in time  range)  rosuvastatin (CRESTOR) tablet 10 mg (has no administration in time range)  famotidine (PEPCID) tablet 40 mg (has no administration in time range)  multivitamin-lutein (OCUVITE-LUTEIN) capsule 1 capsule (has no administration in time range)  Fish Oil CAPS 1,200 mg (has no administration in time range)  nitroGLYCERIN (NITROSTAT) SL tablet 0.4 mg (has no administration in time range)  acetaminophen (TYLENOL) tablet 650 mg (has no administration in time range)  ondansetron (ZOFRAN) injection 4 mg (has no administration in time range)  heparin injection 5,000 Units (has no administration in time range)  aspirin tablet 325 mg (has no administration  in time range)  amiodarone (PACERONE) tablet 200 mg (has no administration in time range)    ED Course/ Medical Decision Making/ A&P Clinical Course as of 02/06/22 1604  Wed Feb 06, 2022  1338 Hx of NSVT in April on monitor per cardiology report [MT]  1356 Cardiology team to see patient, anticipating admission.  I reviewed UA from yesterday - no clear signs of UTI on that testing, but he can continue keflex inpatient if there is concern for UTI [MT]    Clinical Course User Index [MT] Damaya Channing, Carola Rhine, MD                           Medical Decision Making Amount and/or Complexity of Data Reviewed Labs: ordered. Radiology: ordered.  Risk Decision regarding hospitalization.   This patient presents to the ED with concern for syncope versus near syncope. This involves an extensive number of treatment options, and is a complaint that carries with it a high risk of complications and morbidity.  The differential diagnosis includes arrhythmia versus anemia with orthostatic hypotension versus other  This would be highly consistent with stroke.  Do not believe emergent CT imaging of the head is necessary.  He has no neurological deficits, denies headache.  Likewise this is not consistent with a PE, given her transient has episodes where.  Has no  hypoxia or tachycardia.  He takes anticoagulation  Co-morbidities that complicate the patient evaluation: History of significant cardiovascular disease as outlined above raises concern for recurring cardiovascular complication  Additional history obtained from EMS, patient's daughter at bedside  External records from outside source obtained and reviewed including recent hospital discharge summary and echocardiogram as noted in history above  I ordered and personally interpreted labs.  The pertinent results include:   Trop very mildly elevated, not unusual in the setting of recent cardiac interventions, electrolytes largely within normal limits, UA without any clear sign of infection  I ordered imaging studies including x-ray of the chest I independently visualized and interpreted imaging which showed no focal infiltrate or acute abnormality I agree with the radiologist interpretation  The patient was maintained on a cardiac monitor.  I personally viewed and interpreted the cardiac monitored which showed an underlying rhythm of: Sinus rhythm with occasional PVCs  Per my interpretation the patient's ECG shows left bundle branch block pattern which is chronic, no significant acute ischemic changes from prior EKGs.  Patient already received 1 L IV fluids by EMS prior to arrival.  I have reviewed the patients home medicines and have made adjustments as needed  I spoke to the cardiology team Marlyce Huge PA, who had evaluated the patient in the office.  They question orthostatic hypotension versus an arrhythmia, given his significant heart failure and recent cardiac surgery, it is difficult to exclude this.  We will continue monitoring him on telemetry, but he may benefit from overnight observation and fitting with Life Vest to continue monitoring heart rhythm at home.  The cardiology was consulted, will evaluate the patient and likely admit to their service.  The patient is currently living with  his daughter while he recovers from his surgery.  Other than that he is normally living independently by himself  After the interventions noted above, I reevaluated the patient and found that they have: stayed the same  Dispostion:  After consideration of the diagnostic results and the patients response to treatment, I feel that the patent would benefit from admission and  continued monitoring.         Final Clinical Impression(s) / ED Diagnoses Final diagnoses:  Syncope, unspecified syncope type    Rx / DC Orders ED Discharge Orders     None         Wyvonnia Dusky, MD 02/06/22 [phone]

## 2022-02-06 NOTE — ED Triage Notes (Signed)
Pt from Heart failure clinic. Pt became diaphoretic and passed out. BP systolic was in the 0000000, HR in the 80s. Pt had a CABG x4, 2 weeks ago. Incision to the rt forearm. Pt was there for a follow up appointment for TOC. Pt also had a syncopal episode monday at his cardiologist office. Pt newly diagnosed with HF March of this year. Pt A&Ox4 on arrival. Pt was able to move himself over to ED stretcher.

## 2022-02-06 NOTE — Progress Notes (Signed)
Chaplain responded to code in Heart Failure clinic. Roger Smith was discharged from the hospital earlier this week after spending a week inpatient post bypass surgery. He was accompanied by his daughter, Roger Smith. Both Roger Smith and Roger Smith are appropriately shocked at the events of the morning and the news that Roger Smith will likely need to spend the night in the hospital. Chaplain accompanied the family to the ED and provided reflective listening as they began processing the experience.  Family notified that chaplain Roger Smith will continue to follow.  Please page as further needs arise.  Roger Smith. Elyn Peers, M.Div. [facility] Chaplain Pager [phone] Office [phone]

## 2022-02-06 NOTE — Progress Notes (Signed)
  Subjective: Patient examined, CXR and echocardiogram personally reviewed 2 episodes of presyncope, unresponsiveness in last 2 days at medical clinics. First episode occurred during removal of surgical staples- second occurred during check-in at [facility]. Had a great session at home with home PT yesterday Has had elevated  WBC with abnormal UA and started on Keflex CXR is clear, surgical incisions clean,dry. Echo show some improvement in LV fx  , now about 25-30% - no pericardial effusion No arrhythmias  noted.  Probably having episodes of hypotension and meds adjusted. Will check Head CT while hospitalized Consider dosing with midodrine at home.  Objective: Vital signs in last 24 hours: Temp:  [98.1 F (36.7 C)] 98.1 F (36.7 C) (06/07 1254) Pulse Rate:  [90-95] 93 (06/07 1500) Resp:  [17-23] 19 (06/07 1500) BP: (102-131)/(68-80) 114/72 (06/07 1500) SpO2:  [94 %-96 %] 95 % (06/07 1500) Weight:  [71.7 kg-72.3 kg] 71.7 kg (06/07 1252)  Hemodynamic parameters for last 24 hours:    Intake/Output from previous day: No intake/output data recorded. Intake/Output this shift: No intake/output data recorded.       Exam    General- alert and comfortable. Sternal, L arm, R leg incisions ok    Neck- no JVD, no cervical adenopathy palpable, no carotid bruit   Lungs- clear without rales, wheezes   Cor- regular rate and rhythm, no murmur , gallop   Abdomen- soft, non-tender   Extremities - warm, non-tender, minimal edema   Neuro- oriented, appropriate, no focal weakness   Lab Results: Recent Labs    02/06/22 1230 02/06/22 1631  WBC 17.0* 15.1*  HGB 12.6* 11.0*  HCT 38.4* 34.4*  PLT 456* 334   BMET:  Recent Labs    02/04/22 1407 02/06/22 1230 02/06/22 1631  NA 137 136  --   K 4.8 4.2  --   CL 102 104  --   CO2 26 24  --   GLUCOSE 107* 125*  --   BUN 21 15  --   CREATININE 0.91 0.98 0.82  CALCIUM 10.2 10.0  --     PT/INR: No results for input(s): LABPROT, INR  in the last 72 hours. ABG    Component Value Date/Time   PHART 7.364 01/24/2022 0453   HCO3 22.6 01/24/2022 0453   TCO2 24 01/24/2022 0453   ACIDBASEDEF 3.0 (H) 01/24/2022 0453   O2SAT 68.7 01/26/2022 0315   CBG (last 3)  No results for input(s): GLUCAP in the last 72 hours.  Assessment/Plan: S/P  Head CT Cover  UTI with oral antibiotics Continue to hold meds as currently done  LOS: 0 days    Dahlia Byes 02/06/2022

## 2022-02-06 NOTE — Significant Event (Signed)
Rapid Response Event Note   Reason for Call :  Syncope/diaphoretic  Initial Focused Assessment:  Patient lying on a stretcher.  He is responsive but severely weak, pale and diaphoretic Doppled BP 90  SR 85 RR 18 O2 sat 95  Amy NP at bedside Dr Shirlee Latch came to bedside  Interventions:  Placed 20 ga PIV left upper arm. NS bolus started After a few minutes his mental status improved.  No focal neurological deficits  Transported to ED  Plan of Care:     Event Summary:   MD Notified: Amy/Dr Shirlee Latch Call Time: 1218 Arrival Time: 1222 End Time: 1300  Marcellina Millin, RN

## 2022-02-06 NOTE — Telephone Encounter (Signed)
Call attempted to confirm HV TOC appt 12 noon on 02/06/22. HIPPA appropriate VM left with callback number.    Rhae Hammock, BSN, Scientist, clinical (histocompatibility and immunogenetics) Only

## 2022-02-06 NOTE — ED Notes (Signed)
Rapid Response RN at bedside. Pt received 1L of NS.

## 2022-02-06 NOTE — ED Notes (Signed)
Orthostatic vitals: lying 119/75 pulse 97 sitting B/P 118/89 pulse 100 standing B/P 102/69 pulse 99 standing B/P 115/91 pulse 102

## 2022-02-07 ENCOUNTER — Observation Stay (HOSPITAL_COMMUNITY): Payer: Medicare Other

## 2022-02-07 ENCOUNTER — Observation Stay (HOSPITAL_BASED_OUTPATIENT_CLINIC_OR_DEPARTMENT_OTHER)
Admit: 2022-02-07 | Discharge: 2022-02-07 | Disposition: A | Discharge status: Home / Self Care | Payer: Medicare Other | Attending: Physician Assistant | Admitting: Physician Assistant

## 2022-02-07 ENCOUNTER — Ambulatory Visit: Payer: Self-pay | Admitting: Cardiothoracic Surgery

## 2022-02-07 DIAGNOSIS — Z7982 Long term (current) use of aspirin: Secondary | ICD-10-CM | POA: Diagnosis not present

## 2022-02-07 DIAGNOSIS — I509 Heart failure, unspecified: Secondary | ICD-10-CM

## 2022-02-07 DIAGNOSIS — E860 Dehydration: Secondary | ICD-10-CM | POA: Diagnosis present

## 2022-02-07 DIAGNOSIS — R55 Syncope and collapse: Secondary | ICD-10-CM

## 2022-02-07 DIAGNOSIS — I472 Ventricular tachycardia, unspecified: Secondary | ICD-10-CM | POA: Diagnosis not present

## 2022-02-07 DIAGNOSIS — Z951 Presence of aortocoronary bypass graft: Secondary | ICD-10-CM | POA: Diagnosis not present

## 2022-02-07 DIAGNOSIS — I252 Old myocardial infarction: Secondary | ICD-10-CM | POA: Diagnosis not present

## 2022-02-07 DIAGNOSIS — T502X5A Adverse effect of carbonic-anhydrase inhibitors, benzothiadiazides and other diuretics, initial encounter: Secondary | ICD-10-CM | POA: Diagnosis present

## 2022-02-07 DIAGNOSIS — Z8249 Family history of ischemic heart disease and other diseases of the circulatory system: Secondary | ICD-10-CM | POA: Diagnosis not present

## 2022-02-07 DIAGNOSIS — N390 Urinary tract infection, site not specified: Secondary | ICD-10-CM | POA: Diagnosis present

## 2022-02-07 DIAGNOSIS — E785 Hyperlipidemia, unspecified: Secondary | ICD-10-CM | POA: Diagnosis present

## 2022-02-07 DIAGNOSIS — I255 Ischemic cardiomyopathy: Secondary | ICD-10-CM | POA: Diagnosis present

## 2022-02-07 DIAGNOSIS — Z79899 Other long term (current) drug therapy: Secondary | ICD-10-CM | POA: Diagnosis not present

## 2022-02-07 DIAGNOSIS — K219 Gastro-esophageal reflux disease without esophagitis: Secondary | ICD-10-CM | POA: Diagnosis present

## 2022-02-07 DIAGNOSIS — I2582 Chronic total occlusion of coronary artery: Secondary | ICD-10-CM | POA: Diagnosis present

## 2022-02-07 DIAGNOSIS — I110 Hypertensive heart disease with heart failure: Secondary | ICD-10-CM | POA: Diagnosis present

## 2022-02-07 DIAGNOSIS — I5022 Chronic systolic (congestive) heart failure: Secondary | ICD-10-CM | POA: Diagnosis present

## 2022-02-07 DIAGNOSIS — I2510 Atherosclerotic heart disease of native coronary artery without angina pectoris: Secondary | ICD-10-CM | POA: Diagnosis present

## 2022-02-07 DIAGNOSIS — D62 Acute posthemorrhagic anemia: Secondary | ICD-10-CM | POA: Diagnosis present

## 2022-02-07 DIAGNOSIS — Z800 Family history of malignant neoplasm of digestive organs: Secondary | ICD-10-CM | POA: Diagnosis not present

## 2022-02-07 DIAGNOSIS — R5381 Other malaise: Secondary | ICD-10-CM | POA: Diagnosis present

## 2022-02-07 DIAGNOSIS — J90 Pleural effusion, not elsewhere classified: Secondary | ICD-10-CM | POA: Diagnosis not present

## 2022-02-07 DIAGNOSIS — R0989 Other specified symptoms and signs involving the circulatory and respiratory systems: Secondary | ICD-10-CM | POA: Diagnosis not present

## 2022-02-07 DIAGNOSIS — I959 Hypotension, unspecified: Secondary | ICD-10-CM | POA: Diagnosis present

## 2022-02-07 DIAGNOSIS — Z8673 Personal history of transient ischemic attack (TIA), and cerebral infarction without residual deficits: Secondary | ICD-10-CM | POA: Diagnosis not present

## 2022-02-07 DIAGNOSIS — I3139 Other pericardial effusion (noninflammatory): Secondary | ICD-10-CM | POA: Diagnosis present

## 2022-02-07 HISTORY — DX: Heart failure, unspecified: I50.9

## 2022-02-07 LAB — URINALYSIS
Bilirubin Urine: NEGATIVE
Hgb urine dipstick: NEGATIVE
Ketones, ur: NEGATIVE
Leukocytes,Ua: NEGATIVE
Nitrite: NEGATIVE
Protein, ur: NEGATIVE
Specific Gravity, Urine: 1.01 (ref 1.001–1.035)
pH: 6.5 (ref 5.0–8.0)

## 2022-02-07 LAB — URINE CULTURE
MICRO NUMBER:: 13490431
SPECIMEN QUALITY:: ADEQUATE

## 2022-02-07 LAB — EXTRA URINE SPECIMEN

## 2022-02-07 LAB — TROPONIN I (HIGH SENSITIVITY): Troponin I (High Sensitivity): 30 ng/L — ABNORMAL HIGH (ref <18)

## 2022-02-07 NOTE — Discharge Planning (Signed)
RNCM received call from Life Vest Rep [employer]) regarding order for DME Life Vest for home use.  Caroline requests H&P, order and testing/lab information be faxed but did not leave fax number.  RNCM returned call and left voicemail for call back. Will update team on Life Vest ETA when information becomes available.

## 2022-02-07 NOTE — Progress Notes (Addendum)
The patient walk with rehab.  He did well. Echo demonstrates EF 20 to 25%.  Slightly improved compared to May study. He has been seen by Dr. Maren Beach who has followed the patient and feels that he is doing better. He will follow-up with T CTS on Monday. Plan discharge today. Needs live monitor and LifeVest prior to discharge.

## 2022-02-07 NOTE — Progress Notes (Signed)
   Discussed with Janan Halter, RN who is assisting with LifeVest. Patient's insurance has not yet approved LifeVest at this time so the device has not yet been placed. Patient has to remain at the hospital until LifeVest is placed. As patient will be here for 2+ midnights (admitted on 6/7), I have admitted patient to inpatient.   Margie Billet, PA-C 02/07/2022 5:28 PM

## 2022-02-07 NOTE — Care Management Obs Status (Signed)
MEDICARE OBSERVATION STATUS NOTIFICATION   Patient Details  Name: Roger Smith MRN: [id number] Date of Birth: 12-31-1940   Medicare Observation Status Notification Given:  Yes    Oletta Cohn, RN 02/07/2022, 3:04 PM

## 2022-02-07 NOTE — Progress Notes (Addendum)
      [address]       [place] [address]             [phone]            Subjective: Patient feeling much better  Objective: Vital signs in last 24 hours: Temp:  [97.6 F (36.4 C)-98.1 F (36.7 C)] 97.6 F (36.4 C) (06/08 0731) Pulse Rate:  [90-99] 97 (06/08 0800) Resp:  [17-26] 21 (06/08 0800) BP: (102-152)/(68-98) 148/94 (06/08 0800) SpO2:  [92 %-96 %] 95 % (06/08 0800) Weight:  [71.7 kg-72.3 kg] 71.7 kg (06/07 1252)   Current Weight  02/06/22 71.7 kg       Intake/Output from previous day: No intake/output data recorded.   Physical Exam:  Cardiovascular: RRR Pulmonary: Clear to auscultation bilaterally Abdomen: Soft, non tender, bowel sounds present. Extremities: Mild RLE (mild hematoma, no sign infection) Wounds: Clean and dry.  No erythema or signs of infection.  Lab Results: CBC: Recent Labs    02/06/22 1631 02/06/22 1849  WBC 15.1* 14.9*  HGB 11.0* 11.3*  HCT 34.4* 34.8*  PLT 334 381   BMET:  Recent Labs    02/06/22 1230 02/06/22 1631 02/06/22 1849  NA 136  --  138  K 4.2  --  4.2  CL 104  --  106  CO2 24  --  25  GLUCOSE 125*  --  130*  BUN 15  --  13  CREATININE 0.98 0.82 0.93  CALCIUM 10.0  --  9.7    PT/INR:  Lab Results  Component Value Date   INR 1.3 (H) 01/24/2022   INR 1.5 (H) 01/23/2022   INR 1.0 01/21/2022   ABG:  INR: Will add last result for INR, ABG once components are confirmed Will add last 4 CBG results once components are confirmed  Assessment/Plan:  1. CV - SR. On Amiodarone 200 mg daily 2.  Pulmonary - On room air. CXR this am shows small left pleural effusion, left base atelectasis, and pulmonary vascular congestion. Encourage incentive spirometer 3.  Expected post op acute blood loss anemia - H and H yesterday stable at 11.3 and 34.8 4. Leukocytosis-WBC yesterday decreased to 14,900. Given Keflex for UTI 5. 2 syncopal episodes at home. 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 Findings discussed with daughter and patient  Roger Smith 8:49 AM   Patient was stable overnight, up and down to the bathroom several times and had BM this morning.  Blood pressure has been stable. Head CT scan last night was unremarkable.  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 White count continues to decrease with oral antibiotic for probable UTI  Patient's episodes most probably related to hypotension of multifactorial origin including meds, dehydration from overdiuresis, fluid restriction, and UTI.  I plan on seeing patient back in the office on Monday and I feel that he could stop his Coreg, Imdur,  Lasix, Farxiga, and continue low-dose 200 mg amiodarone once daily.  patient examined and medical record reviewed,agree with above note. Dahlia Byes 02/07/2022

## 2022-02-07 NOTE — Progress Notes (Addendum)
This RNCM spoke with [employer] who advised documents for life vest review have been received however the order does not have Dr. Michaelle Copas electronic signature. This RNCM notified Dr. Katrinka Blazing awaiting response.  Once EDP electronically signs the life vest order the order can be faxed to [phone].

## 2022-02-07 NOTE — ED Notes (Signed)
Secure chat sent, messaged read by receiving nurse.  No reply at this time

## 2022-02-07 NOTE — Discharge Summary (Addendum)
Discharge Summary    Patient ID: Roger Smith MRN: [id number]; DOB: 1941/07/16  Admit date: 02/06/2022 Discharge date: 02/08/2022  PCP:  Paulina Fusi, MD   [facility] Providers Cardiologist:  Gypsy Balsam, MD   {  Discharge Diagnoses    Principal Problem:   Syncope Active Problems:   Heart failure [facility]) CAD s/p recent CABG Ischemic cardiomyopathy Decreased responsiveness UTI Hypotension  Diagnostic Studies/Procedures    Limited Echo: 02/06/2022  1. Left ventricular ejection fraction, by estimation, is 20 to 25%. The  left ventricle has severely decreased function. The left ventricle  demonstrates regional wall motion abnormalities (see scoring  diagram/findings for description). The inferior wall  is akinetic. The rest of the LV segments are severely hypokinetic. There  is mild concentric left ventricular hypertrophy.   2. Right ventricular systolic function is moderately reduced. The right  ventricular size is normal.   3. Left atrial size was mildly dilated.   4. A small pericardial effusion is present. The pericardial effusion is  posterior and lateral to the left ventricle.   5. The mitral valve is normal in structure.   6. The aortic valve is tricuspid. There is mild calcification of the  aortic valve. There is mild thickening of the aortic valve. Aortic valve  sclerosis/calcification is present, without any evidence of aortic  stenosis.   7. Aortic dilatation noted. There is borderline dilatation of the  ascending aorta, measuring 36 mm.   Comparison(s): Compared to prior TTE on 01/21/22, the LVEF appears  slightly better at 20-25% from <20%.    History of Present Illness     Roger Smith is a 81 y.o. male with with CAD s/p CABG x 4 01/23/22, HTN, TIA, RBBB and syncope who is being seen 02/06/2022 for the evaluation of syncope.   Patient had exertional chest pain and syncope leading to admission at [facility] 10/2021.  Echo with EF of 35%  with wall motion abnormality.  Follow-up coronary CT was abnormal.  Subsequently underwent cardiac catheterization showing multivessel CAD.  Echocardiogram 5/22 with LV function less than 20%, grade 2 diastolic dysfunction.  He underwent CABG x4 on 01/23/2022.  Had postop atrial fibrillation and converted to sinus rhythm on IV amiodarone.  He was discharged on p.o. amiodarone, Coreg, Farxiga and furosemide for few days.   Monitor 12/2021 Summary and conclusions: Multiple episodes of ventricular tachycardia noted total of 96 with longest episode 12.8 seconds. 25 episode of supraventricular tachycardia. Total burden of ventricular ectopy 1.8%   On Saturday prior to admission patient had episode of hypotension with systolic blood pressure in 70s at home.  Lasix discontinued.   Patient with episode of syncope with hypotension at 79/54 during suture removal at [facility] office Monday.  Advised to hold Coreg x 2 days than resume at 1/2 dose and stop her [place].  Amiodarone reduced to daily.  Started on antibiotics for possible UTI.  Mr. Rollo seen in heart failure clinic the day of admission and noted unresponsive and unable to follow commands.  Patient was waiting for provider to come in. Daughter was in room.  She did not notice anything abnormal.  Patient was not answering questions from Unice Cobble, [place]. Sinus rhythm on monitor.  Systolic blood pressure of 90 Doppler monitor.  He was given bolus of normal saline and transported to emergency room.  Denied any chest pain, shortness of breath, palpitation, dizziness, orthopnea, PND or melena.  Has right lower extremity edema.   Creatinine and potassium are normal  High-sensitivity troponin 27 Hemoglobin 12.6 WBC 17 Chest x-ray LEFT basilar atelectasis and small LEFT pleural effusion.  Hospital Course     Consultants: CTCS   Decreased responsiveness: Associated with diaphoresis and hypotension. Daughter was present during episode.  No seizure-like  activity.  He was noted hypotensive both times. Patient was admitted overnight for observation. CT of head without acute issue. Troponin not consistent with ACS -- Echocardiogram with LV function of 20 to 25%.  Small pericardial effusion -- Given low EF and recurrent syncope type episode patient will be arranged for LifeVest and ZIO monitor  Ischemic cardiomyopathy: EF was 30% on 11/29/2021 at [facility] with wall motion abnormality. Echo 01/21/22 with EF < 20% -- Limited echocardiogram with LV function of 20 to 25%.  No regional wall motion abnormality.  Small pericardial effusion. -- Given hypotension with syncope, recommended to hold all cardiac directed medical therapy including coreg and Imdur   CAD s/p CABG x4: Continue aspirin and statin -- No chest pain -- Seen by CT CS while admitted   Postop atrial fibrillation: Converted to sinus rhythm on IV amiodarone last admission.  He was discharged on amiodarone 200 mg daily.  If recurrent episode of atrial fibrillation, he will need anticoagulation.  Maintaining sinus rhythm.  -- Continue amiodarone 200 mg daily.   PVC/VT: No arrhythmia while admitted -- Lifevest at discharge  UTI: He was started on Keflex Monday.   -- complete antibiotic course.   Patient was seen by Dr. Katrinka Blazing and deemed stable for discharge home once lifevest placed. Follow up in the office was arranged. Medications adjusted. Educated by pharmD prior to discharge.   Did the patient have an acute coronary syndrome (MI, NSTEMI, STEMI, etc) this admission?:  No                               Did the patient have a percutaneous coronary intervention (stent / angioplasty)?:  No.    _____________  Discharge Vitals Blood pressure 112/78, pulse 92, temperature 98.7 F (37.1 C), temperature source Oral, resp. rate 18, height  (1.753 m), weight 71.6 kg, SpO2 97 %.  Filed Weights   02/06/22 1252 02/07/22 1954  Weight: 71.7 kg 71.6 kg    Labs & Radiologic Studies     CBC Recent Labs    02/06/22 1230 02/06/22 1631 02/06/22 1849  WBC 17.0* 15.1* 14.9*  NEUTROABS 13.4*  --   --   HGB 12.6* 11.0* 11.3*  HCT 38.4* 34.4* 34.8*  MCV 100.8* 103.3* 100.9*  PLT 456* 334 381   Basic Metabolic Panel Recent Labs    16/10/96 1230 02/06/22 1631 02/06/22 1849  NA 136  --  138  K 4.2  --  4.2  CL 104  --  106  CO2 24  --  25  GLUCOSE 125*  --  130*  BUN 15  --  13  CREATININE 0.98 0.82 0.93  CALCIUM 10.0  --  9.7  MG 2.4  --   --     High Sensitivity Troponin:   Recent Labs  Lab 02/06/22 1230 02/06/22 1631 02/06/22 1849 02/07/22 0315  TROPONINIHS 27* 24* 28* 30*     _____________  DG Chest Port 1 View  Result Date: 02/07/2022 CLINICAL DATA:  Status post CABG. EXAM: PORTABLE CHEST 1 VIEW COMPARISON:  01/29/2022 FINDINGS: Status post median sternotomy CABG procedure. Normal heart size. Small left pleural effusion and pulmonary vascular congestion noted.  Retrocardiac left lung base opacity is favored to compatible with either atelectasis or infiltrate. IMPRESSION: Small left pleural effusion and pulmonary vascular congestion. Left lower lobe atelectasis versus infiltrate. Electronically Signed   By: Signa Kell M.D.   On: 02/07/2022 05:35   CT HEAD WO CONTRAST ( )  Result Date: 02/06/2022 CLINICAL DATA:  Syncope/presyncope. EXAM: CT HEAD WITHOUT CONTRAST TECHNIQUE: Contiguous axial images were obtained from the base of the skull through the vertex without intravenous contrast. RADIATION DOSE REDUCTION: This exam was performed according to the departmental dose-optimization program which includes automated exposure control, adjustment of the mA and/or kV according to patient size and/or use of iterative reconstruction technique. COMPARISON:  CT head dated November 11, 2021 FINDINGS: Brain: No evidence of acute infarction, hemorrhage, hydrocephalus, extra-axial collection or mass lesion/mass effect. Chronic microvascular ischemic changes of the white  matter, unchanged. 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 Hypodense structure at the right CP angle and cerebellum medullary cistern suggesting arachnoid cyst, unchanged. Vascular: No hyperdense vessel or unexpected calcification. 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 Sinuses/Orbits: No acute finding. Other: None. IMPRESSION: 1. No acute intracranial abnormality. 2. 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 Electronically Signed   By: Larose Hires D.O.   On: 02/06/2022 19:07   ECHOCARDIOGRAM LIMITED  Result Date: 02/06/2022    ECHOCARDIOGRAM LIMITED REPORT   Patient Name:   Roger Smith Date of Exam: 02/06/2022 Medical Rec #:  [id number]     Height:       69.0 in Accession #:    [id number]    Weight:       158.0 lb Date of Birth:  June 15, 1941     BSA:          1.869 m Patient Age:    81 years      BP:           114/72 mmHg Patient Gender: M             HR:           94 bpm. Exam Location:  Inpatient Procedure: 2D Echo and Limited Echo Indications:    Syncope  History:        Patient has no prior history of Echocardiogram examinations.  Sonographer:    Eduard Roux Referring Phys: [id number] Sharrell Ku BHAGAT IMPRESSIONS  1. Left ventricular ejection fraction, by estimation, is 20 to 25%. The left ventricle has severely decreased function. The left ventricle demonstrates regional wall motion abnormalities (see scoring diagram/findings for description). The inferior wall is akinetic. The rest of the LV segments are severely hypokinetic. There is mild concentric left ventricular hypertrophy.  2. Right ventricular systolic function is moderately reduced. The right ventricular size is normal.  3. Left atrial size was mildly dilated.  4. A small pericardial effusion is present. The pericardial effusion is posterior and lateral to the left ventricle.   5. The mitral valve is normal in structure.  6. The aortic valve is tricuspid. There is mild calcification of the aortic valve. There is mild thickening of the aortic valve. Aortic valve sclerosis/calcification is present, without any evidence of aortic stenosis.  7. Aortic dilatation noted. There is borderline dilatation of the ascending aorta, measuring 36 mm. Comparison(s): Compared to prior TTE on 01/21/22, the LVEF appears slightly better at 20-25% from <20%. FINDINGS  Left Ventricle: Left ventricular ejection fraction, by estimation, is 20  to 25%. The left ventricle has severely decreased function. The left ventricle demonstrates regional wall motion abnormalities. The inferior LV wall is akinetic. The rest of the LV  walls are severely hypokinetic. The left ventricular internal cavity size was mildly dilated. There is mild concentric left ventricular hypertrophy. Right Ventricle: The right ventricular size is normal. Right ventricular systolic function is moderately reduced. Left Atrium: Left atrial size was mildly dilated. Right Atrium: Right atrial size was normal in size. Pericardium: A small pericardial effusion is present. The pericardial effusion is posterior and lateral to the left ventricle. Mitral Valve: The mitral valve is normal in structure. Tricuspid Valve: The tricuspid valve is normal in structure. Aortic Valve: The aortic valve is tricuspid. There is mild calcification of the aortic valve. There is mild thickening of the aortic valve. Aortic valve sclerosis/calcification is present, without any evidence of aortic stenosis. Pulmonic Valve: The pulmonic valve was normal in structure. Aorta: Aortic dilatation noted. There is borderline dilatation of the ascending aorta, measuring 36 mm. LEFT VENTRICLE PLAX 2D LVIDd:         5.80 cm LVIDs:         5.10 cm LV PW:         1.30 cm LV IVS:        1.20 cm  LV Volumes (MOD) LV vol d, MOD A4C: 166.0 ml LV vol s, MOD A4C: 132.0 ml LV SV MOD A4C:     166.0  ml RIGHT VENTRICLE RV Basal diam:  2.40 cm LEFT ATRIUM             Index        RIGHT ATRIUM           Index LA diam:        3.90 cm 2.09 cm/m   RA Area:     19.80 cm LA Vol (A2C):   53.8 ml 28.78 ml/m  RA Volume:   56.20 ml  30.07 ml/m LA Vol (A4C):   69.3 ml 37.08 ml/m LA Biplane Vol: 64.8 ml 34.67 ml/m   AORTA Ao Root diam: 3.60 cm Ao Asc diam:  3.60 cm Laurance FlattenHeather Pemberton MD Electronically signed by Laurance FlattenHeather Pemberton MD Signature Date/Time: 02/06/2022/4:21:31 PM    Final    DG Chest Portable 1 View  Result Date: 02/06/2022 CLINICAL DATA:  Syncope EXAM: PORTABLE CHEST 1 VIEW COMPARISON:  Portable exam 1327 hours compared to 01/29/2022 FINDINGS: Borderline enlargement of cardiac silhouette post CABG. Mediastinal contours and pulmonary vascularity normal. Atherosclerotic calcification aorta. Mild LEFT basilar atelectasis and small pleural effusion. Scarring LEFT upper lobe stable. No acute infiltrate or pneumothorax. Bones demineralized. IMPRESSION: LEFT basilar atelectasis and small LEFT pleural effusion. Electronically Signed   By: Ulyses SouthwardMark  Boles M.D.   On: 02/06/2022 13:48   DG Chest 2 View  Result Date: 01/29/2022 CLINICAL DATA:  81 year old male with pleural effusions. Postoperative day 6 status post CABG. EXAM: CHEST - 2 VIEW COMPARISON:  Portable chest 01/26/2022 and earlier. FINDINGS: PA and lateral views at 0548 hours. Chest tubes and right IJ introducer sheath removed. Epicardial pacer wires may have also been removed. No pneumothorax or pulmonary edema. Small bilateral pleural effusions. Stable cardiac size and mediastinal contours. Sequelae of CABG. Visualized tracheal air column is within normal limits. No air bronchograms. No acute osseous abnormality identified. Negative visible bowel gas. IMPRESSION: 1. Chest tubes and right IJ introducer sheath removed. 2. Small bilateral pleural effusions with No pneumothorax or pulmonary edema. Electronically Signed   By: HRexene Edison  Margo Aye M.D.   On: 01/29/2022 08:00    DG CHEST PORT 1 VIEW  Result Date: 01/26/2022 CLINICAL DATA:  Evaluate for pleural effusion. EXAM: PORTABLE CHEST 1 VIEW COMPARISON:  01/25/2022 FINDINGS: Status post median sternotomy and CABG procedure. There are bilateral chest tubes in place. No pneumothorax. Right IJ Cordis is stable in position projecting over the proximal SVC. Lung volumes have improved compared with the previous exam. Continued blunting of the costophrenic angles due to small residual pleural effusions with overlying platelike atelectasis noted. No signs of interstitial edema or airspace disease. IMPRESSION: 1. Bilateral chest tubes in place without pneumothorax. 2. Small residual bilateral pleural effusions and overlying atelectasis. Electronically Signed   By: Signa Kell M.D.   On: 01/26/2022 09:49   DG Chest Port 1 View  Result Date: 01/25/2022 CLINICAL DATA:  History of CABG EXAM: PORTABLE CHEST 1 VIEW COMPARISON:  Chest x-ray dated Jan 24, 2022 FINDINGS: Interval removal of PA catheter with right IJ line sheath remaining in place. Unchanged position of bilateral chest tubes. Cardiac and mediastinal contours are unchanged post median sternotomy and CABG. Mild basilar predominant opacities, likely due to atelectasis. Small bilateral pleural effusions. No evidence of pneumothorax. IMPRESSION: 1. Interval removal of PA catheter with right IJ line sheath remaining in place. 2. Unchanged position of bilateral chest tubes. No evidence of pneumothorax. Electronically Signed   By: Allegra Lai M.D.   On: 01/25/2022 08:40   DG Chest Port 1 View  Result Date: 01/24/2022 CLINICAL DATA:  Pneumothorax EXAM: PORTABLE CHEST 1 VIEW COMPARISON:  01/23/2022 FINDINGS: Endotracheal and enteric tubes are no longer present. Right IJ Swan-Ganz catheter is unchanged. Bilateral chest tubes are still present. No pneumothorax. Patchy bibasilar atelectasis. Stable cardiomediastinal contours. IMPRESSION: Lines and tubes as above. No  pneumothorax. Patchy bibasilar atelectasis. Electronically Signed   By: Guadlupe Spanish M.D.   On: 01/24/2022 08:17   DG Chest Port 1 View  Result Date: 01/23/2022 CLINICAL DATA:  Post CABG EXAM: PORTABLE CHEST 1 VIEW COMPARISON:  Portable exam 1614 hours compared to 01/21/2022 FINDINGS: Tip of endotracheal tube projects 7.1 cm above carina. Tip of nasogastric tube projects over mid esophagus; recommend advancing tube 17 cm to place proximal side-port within stomach. BILATERAL thoracostomy tubes present. RIGHT jugular Swan-Ganz catheter with tip projecting over proximal RIGHT pulmonary artery. Enlargement of cardiac silhouette post CABG with epicardial pacing wires noted. Pulmonary vascular congestion and mild perihilar edema bilaterally. No pleural effusion or pneumothorax. IMPRESSION: Expected postoperative changes with mild perihilar edema. Recommend advancing nasogastric tube 17 cm to place tip in the stomach. Electronically Signed   By: Ulyses Southward M.D.   On: 01/23/2022 16:31   ECHO INTRAOPERATIVE TEE  Result Date: 01/23/2022  *INTRAOPERATIVE TRANSESOPHAGEAL REPORT *  Patient Name:   JOSEANTONIO DITTMAR  Date of Exam: 01/23/2022 Medical Rec #:  [id number]      Height:       69.0 in Accession #:    [id number]     Weight:       168.0 lb Date of Birth:  1941-03-15      BSA:          1.92 m Patient Age:    81 years       BP:           144/90 mmHg Patient Gender: M              HR:           85 bpm. Exam Location:  Anesthesiology Transesophogeal exam was perform intraoperatively during surgical procedure. Patient was closely monitored under general anesthesia during the entirety of examination. Indications:     Coronary artery disease of native artery of native heart with                  stable angina pectoris [facility]) [I25.118] Performing Phys: [id number] PETER VANTRIGT Diagnosing Phys: Gaynelle Adu MD Complications: No known complications during this procedure. POST-OP IMPRESSIONS Overall, there were no significant  changes from pre-bypass. PRE-OP FINDINGS  Left Ventricle: The left ventricle has severely reduced systolic function, with an ejection fraction of 20-30%. The cavity size was mildly dilated. There is no increase in left ventricular wall thickness. Left ventricular diffuse hypokinesis. There is no left ventricular hypertrophy. Right Ventricle: The right ventricle has normal systolic function. The cavity was normal. There is no increase in right ventricular wall thickness. Left Atrium: Left atrial size was not assessed. No left atrial/left atrial appendage thrombus was detected. Right Atrium: Right atrial size was not assessed. Interatrial Septum: No atrial level shunt detected by color flow Doppler. Pericardium: There is no evidence of pericardial effusion. Mitral Valve: The mitral valve is normal in structure. Mitral valve regurgitation is trivial by color flow Doppler. The MR jet is centrally-directed. Tricuspid Valve: The tricuspid valve was normal in structure. Tricuspid valve regurgitation is mild by color flow Doppler. Aortic Valve: The aortic valve is tricuspid Aortic valve regurgitation is trivial by color flow Doppler. Pulmonic Valve: The pulmonic valve was normal in structure. Pulmonic valve regurgitation is trivial by color flow Doppler. +--------------+-------++ LEFT VENTRICLE        +--------------+-------++ PLAX 2D               +--------------+-------++ LVIDd:        5.90 cm +--------------+-------++ LVIDs:        5.10 cm +--------------+-------++ LV SV:        49 ml   +--------------+-------++ LV SV Index:  25.54   +--------------+-------++                       +--------------+-------++  Gaynelle Adu MD Electronically signed by Gaynelle Adu MD Signature Date/Time: 01/23/2022/4:28:13 PM    Final    DG Chest 2 View  Result Date: 01/22/2022 CLINICAL DATA:  Preoperative study prior to CABG. No chest complaints at this time. EXAM: CHEST - 2 VIEW COMPARISON:  CT  coronary arteries December 20, 2021 FINDINGS: The heart size is borderline. The hila and mediastinum are normal. No pneumothorax. No nodules or masses. Scar or atelectasis in the lateral left lung base, unchanged since the scout film from December 20, 2021. No other acute abnormalities are identified. IMPRESSION: No active cardiopulmonary disease. Electronically Signed   By: Gerome Sam III M.D.   On: 01/22/2022 09:02   ECHOCARDIOGRAM COMPLETE  Result Date: 01/21/2022    ECHOCARDIOGRAM REPORT   Patient Name:   Roger Smith Date of Exam: 01/21/2022 Medical Rec #:  [id number]     Height:       69.0 in Accession #:    [id number]    Weight:       168.0 lb Date of Birth:  1941-07-05     BSA:          1.918 m Patient Age:    81 years      BP:           125/73 mmHg Patient Gender: M  HR:           73 bpm. Exam Location:  Outpatient Procedure: 2D Echo, Cardiac Doppler and Color Doppler Indications:    CAD Native Vessel I25.10  History:        Patient has prior history of Echocardiogram examinations, most                 recent 11/11/2021. TIA, Arrythmias:RBBB, Signs/Symptoms:Syncope;                 Risk Factors:GERD.  Sonographer:    Milda Smart Sonographer#2:  Eulah Pont RDCS Referring Phys: [id number] PETER VANTRIGT IMPRESSIONS  1. Left ventricular ejection fraction, by estimation, is <20%. The left ventricle has severely decreased function. The left ventricle demonstrates global hypokinesis. The left ventricular internal cavity size was moderately dilated. Left ventricular diastolic parameters are consistent with Grade II diastolic dysfunction (pseudonormalization). Elevated left atrial pressure.  2. Right ventricular systolic function is moderately reduced. The right ventricular size is normal. There is mildly elevated pulmonary artery systolic pressure.  3. Left atrial size was mildly dilated.  4. The mitral valve is normal in structure. Mild mitral valve regurgitation.  5. The aortic valve is tricuspid.  Aortic valve regurgitation is not visualized. Aortic valve sclerosis/calcification is present, without any evidence of aortic stenosis.  6. The inferior vena cava is normal in size with greater than 50% respiratory variability, suggesting right atrial pressure of 3 mmHg. FINDINGS  Left Ventricle: Left ventricular ejection fraction, by estimation, is <20%. The left ventricle has severely decreased function. The left ventricle demonstrates global hypokinesis. The left ventricular internal cavity size was moderately dilated. There is no left ventricular hypertrophy. Left ventricular diastolic parameters are consistent with Grade II diastolic dysfunction (pseudonormalization). Elevated left atrial pressure. Right Ventricle: The right ventricular size is normal. Right vetricular wall thickness was not assessed. Right ventricular systolic function is moderately reduced. There is mildly elevated pulmonary artery systolic pressure. The tricuspid regurgitant velocity is 2.93 m/s, and with an assumed right atrial pressure of 3 mmHg, the estimated right ventricular systolic pressure is 37.3 mmHg. Left Atrium: Left atrial size was mildly dilated. Right Atrium: Right atrial size was normal in size. Pericardium: There is no evidence of pericardial effusion. Mitral Valve: The mitral valve is normal in structure. Mild mitral valve regurgitation. Tricuspid Valve: The tricuspid valve is normal in structure. Tricuspid valve regurgitation is mild. Aortic Valve: The aortic valve is tricuspid. Aortic valve regurgitation is not visualized. Aortic valve sclerosis/calcification is present, without any evidence of aortic stenosis. Pulmonic Valve: The pulmonic valve was normal in structure. Pulmonic valve regurgitation is mild to moderate. Aorta: The aortic root is normal in size and structure. Venous: The inferior vena cava is normal in size with greater than 50% respiratory variability, suggesting right atrial pressure of 3 mmHg.  IAS/Shunts: No atrial level shunt detected by color flow Doppler.  LEFT VENTRICLE PLAX 2D LVIDd:         5.80 cm      Diastology LVIDs:         5.40 cm      LV e' medial:    4.73 cm/s LV PW:         0.80 cm      LV E/e' medial:  23.5 LV IVS:        0.80 cm      LV e' lateral:   7.65 cm/s LVOT diam:     2.00 cm      LV E/e'  lateral: 14.5 LV SV:         57 LV SV Index:   30 LVOT Area:     3.14 cm  LV Volumes (MOD) LV vol d, MOD A2C: 195.0 ml LV vol d, MOD A4C: 151.0 ml LV vol s, MOD A2C: 138.0 ml LV vol s, MOD A4C: 125.0 ml LV SV MOD A2C:     57.0 ml LV SV MOD A4C:     151.0 ml LV SV MOD BP:      41.9 ml RIGHT VENTRICLE RV S prime:     14.50 cm/s LEFT ATRIUM             Index        RIGHT ATRIUM           Index LA diam:        4.80 cm 2.50 cm/m   RA Area:     17.50 cm LA Vol (A2C):   86.4 ml 45.04 ml/m  RA Volume:   43.30 ml  22.57 ml/m LA Vol (A4C):   61.8 ml 32.21 ml/m LA Biplane Vol: 73.6 ml 38.36 ml/m  AORTIC VALVE LVOT Vmax:   111.00 cm/s LVOT Vmean:  69.200 cm/s LVOT VTI:    0.182 m  AORTA Ao Root diam: 2.90 cm Ao Asc diam:  3.90 cm MITRAL VALVE                  TRICUSPID VALVE MV Area (PHT): 4.10 cm       TR Peak grad:   34.3 mmHg MV Decel Time: 185 msec       TR Vmax:        293.00 cm/s MR Peak grad:    82.6 mmHg MR Mean grad:    54.0 mmHg    SHUNTS MR Vmax:         454.50 cm/s  Systemic VTI:  0.18 m MR Vmean:        347.0 cm/s   Systemic Diam: 2.00 cm MR PISA:         0.57 cm MR PISA Eff ROA: 4 mm MR PISA Radius:  0.30 cm MV E velocity: 111.00 cm/s MV A velocity: 79.10 cm/s MV E/A ratio:  1.40 Dietrich Pates MD Electronically signed by Dietrich Pates MD Signature Date/Time: 01/21/2022/5:22:30 PM    Final    VAS US DOPPLER PRE CABG  Result Date: 01/21/2022 PREOPERATIVE VASCULAR EVALUATION Patient Name:  Roger Smith  Date of Exam:   01/21/2022 Medical Rec #: [id number]      Accession #:    [id number] Date of Birth: 04-14-1941      Patient Gender: M Patient Age:   4 years Exam Location:  [facility]  Procedure:      VAS US DOPPLER PRE CABG Referring Phys: Theron Arista VANTRIGT --------------------------------------------------------------------------------  Indications:      Pre-CABG. Risk Factors:     Hypertension, hyperlipidemia. Other Factors:    TIA. Comparison Study: No prior studies. Performing Technologist: Jean Rosenthal RDMS RVT  Examination Guidelines: A complete evaluation includes B-mode imaging, spectral Doppler, color Doppler, and power Doppler as needed of all accessible portions of each vessel. Bilateral testing is considered an integral part of a complete examination. Limited examinations for reoccurring indications may be performed as noted.  Right Carotid Findings: +----------+--------+--------+--------+------------+------------------+           PSV cm/sEDV cm/sStenosisDescribe    Comments           +----------+--------+--------+--------+------------+------------------+ CCA Prox  86  12                                             +----------+--------+--------+--------+------------+------------------+ CCA Distal60      14                          intimal thickening +----------+--------+--------+--------+------------+------------------+ ICA Prox  69      23      1-39%   heterogenoustortuous           +----------+--------+--------+--------+------------+------------------+ ICA Distal49      22                          tortuous           +----------+--------+--------+--------+------------+------------------+ ECA       82      15                          tortuous           +----------+--------+--------+--------+------------+------------------+ +----------+--------+-------+----------------+------------+           PSV cm/sEDV cmsDescribe        Arm Pressure +----------+--------+-------+----------------+------------+ Subclavian112            Multiphasic, WNL             +----------+--------+-------+----------------+------------+  +---------+--------+--+--------+--+---------+ VertebralPSV cm/s49EDV cm/s16Antegrade +---------+--------+--+--------+--+---------+ Left Carotid Findings: +----------+--------+--------+--------+------------+------------------+           PSV cm/sEDV cm/sStenosisDescribe    Comments           +----------+--------+--------+--------+------------+------------------+ CCA Prox  94      20                                             +----------+--------+--------+--------+------------+------------------+ CCA Distal77      20                          intimal thickening +----------+--------+--------+--------+------------+------------------+ ICA Prox  49      16      1-39%   heterogenous                   +----------+--------+--------+--------+------------+------------------+ ICA Distal51      17                          tortuous           +----------+--------+--------+--------+------------+------------------+ ECA       88      15                                             +----------+--------+--------+--------+------------+------------------+ +----------+--------+--------+----------------+------------+ SubclavianPSV cm/sEDV cm/sDescribe        Arm Pressure +----------+--------+--------+----------------+------------+           76              Multiphasic, WNL             +----------+--------+--------+----------------+------------+ +---------+--------+--+--------+-+---------+ VertebralPSV cm/s25EDV cm/s8Antegrade +---------+--------+--+--------+-+---------+  ABI Findings: +--------+------------------+-----+---------+--------+ Right   Rt Pressure (mmHg)IndexWaveform Comment  +--------+------------------+-----+---------+--------+  [phone]                    triphasic         +--------+------------------+-----+---------+--------+ PTA     100               0.81 biphasic          +--------+------------------+-----+---------+--------+ DP      19                 0.15 triphasic         +--------+------------------+-----+---------+--------+ +--------+------------------+-----+---------+-------+ Left    Lt Pressure (mmHg)IndexWaveform Comment +--------+------------------+-----+---------+-------+ [id number]                    triphasic        +--------+------------------+-----+---------+-------+ PTA     154               1.25 triphasic        +--------+------------------+-----+---------+-------+ DP      116               0.94 triphasic        +--------+------------------+-----+---------+-------+  Right Doppler Findings: +--------+--------+-----+---------+--------+ Site    PressureIndexDoppler  Comments +--------+--------+-----+---------+--------+ [id number]          triphasic         +--------+--------+-----+---------+--------+ Radial               triphasic         +--------+--------+-----+---------+--------+ Ulnar                triphasic         +--------+--------+-----+---------+--------+  Left Doppler Findings: +--------+--------+-----+---------+--------+ Site    PressureIndexDoppler  Comments +--------+--------+-----+---------+--------+ [id number]          triphasic         +--------+--------+-----+---------+--------+ Radial               triphasic         +--------+--------+-----+---------+--------+ Ulnar                triphasic         +--------+--------+-----+---------+--------+  Summary: Right Carotid: Velocities in the right ICA are consistent with a 1-39% stenosis. Left Carotid: Velocities in the left ICA are consistent with a 1-39% stenosis. Vertebrals:  Bilateral vertebral arteries demonstrate antegrade flow. Subclavians: Normal flow hemodynamics were seen in bilateral subclavian              arteries. Right ABI: Resting right ankle-brachial index is within normal range. No evidence of significant right lower extremity arterial disease. Left ABI: Resting left  ankle-brachial index is within normal range. No evidence of significant left lower extremity arterial disease. Right Upper Extremity: Doppler waveforms remain within normal limits with right radial compression. Doppler waveforms remain within normal limits with right ulnar compression. Left Upper Extremity: Doppler waveforms remain within normal limits with left radial compression. Doppler waveforms remain within normal limits with left ulnar compression.  Electronically signed by Sherald Hess MD on 01/21/2022 at 5:21:44 PM.    Final    VAS [place] LOWER EXT SAPHENOUS VEIN MAPPING  Result Date: 01/21/2022 LOWER EXTREMITY VEIN MAPPING Patient Name:  TAEVEON KEESLING  Date of Exam:   01/21/2022 Medical Rec #: [id number]      Accession #:    [id number] Date of Birth: 1941/04/10      Patient Gender: M Patient Age:   59 years Exam [facility]:  [facility] Procedure:      VAS  [place] LOWER EXTREMITY SAPHENOUS VEIN MAPPING Referring Phys: Theron Arista VANTRIGT --------------------------------------------------------------------------------  Indications:        Pre-CABG, varicose veins Other Risk Factors: Patient endorses vein stripping procedure involving left                     leg.  Comparison Study: No prior studies. Performing Technologist: Jean Rosenthal RDMS, RVT  Examination Guidelines: A complete evaluation includes B-mode imaging, spectral Doppler, color Doppler, and power Doppler as needed of all accessible portions of each vessel. Bilateral testing is considered an integral part of a complete examination. Limited examinations for reoccurring indications may be performed as noted. +--------------+-----------+----------------------+--------------+-------------+  RT Diameter  RT Findings         GSV           LT Diameter   LT Findings       (cm)                                           (cm)                   +--------------+-----------+----------------------+--------------+-------------+      0.45                     Saphenofemoral         0.33                                                   Junction                                   +--------------+-----------+----------------------+--------------+-------------+      0.37                    Proximal thigh         0.32                   +--------------+-----------+----------------------+--------------+-------------+      0.36                      Mid thigh                          not                                                                    visualized   +--------------+-----------+----------------------+--------------+-------------+      0.37                     Distal thigh                        not  visualized   +--------------+-----------+----------------------+--------------+-------------+      0.30                         Knee                            not                                                                    visualized   +--------------+-----------+----------------------+--------------+-------------+      0.33      branching       Prox calf                          not                                                                    visualized   +--------------+-----------+----------------------+--------------+-------------+      0.27      branching        Mid calf                          not                                                                    visualized   +--------------+-----------+----------------------+--------------+-------------+      0.19                     Distal calf                         not                                                                    visualized   +--------------+-----------+----------------------+--------------+-------------+      0.21                        Ankle                            not  visualized   +--------------+-----------+----------------------+--------------+-------------+ +----------------+-----------+---------------+----------------+-----------+ RT diameter (cm)RT Findings      SSV      LT Diameter (cm)LT Findings +----------------+-----------+---------------+----------------+-----------+       0.20                 Popliteal fossa      0.21                  +----------------+-----------+---------------+----------------+-----------+       0.20                  Proximal calf       0.27                  +----------------+-----------+---------------+----------------+-----------+       0.19       branching    Mid calf          0.18                  +----------------+-----------+---------------+----------------+-----------+       0.25                   Distal calf        0.25                  +----------------+-----------+---------------+----------------+-----------+ Diagnosing physician: Sherald Hess MD Electronically signed by Sherald Hess MD on 01/21/2022 at 5:19:04 PM.    Final     Disposition   Pt is being discharged home today in good condition.  Follow-up Plans & Appointments     Follow-up Information     [facility] Follow up.   Specialty: Home Health Services Why: Your home health will resume with [facility]. The office will call you with resumption of services information. If you have any questions please call the number listed above. Contact information: [address] [place] [address] [phone]         [facility] [place] Follow up on 02/11/2022.   Specialty: Cardiothoracic Surgery Why: follow up for suture removal Contact information: [address] [place], [address] [place] [address] [phone]        Lovett Sox, MD Follow up on 02/18/2022.   Specialty: Cardiothoracic Surgery Why: at 1:30pm for  your follow up appt        [facility] Follow up on 02/26/2022.   Specialty: Cardiology Why: at 10:30am for your follow up appt Contact information: [address] [id number] mc [place] [address] [phone]               Discharge Instructions     Diet - low sodium heart healthy   Complete by: As directed    For home use only DME Vest life vest   Complete by: As directed    3 months of Lifevest for Ischemic cardiomyopathy. Also syncope and NSVT. Order by Dr. Katrinka Blazing.   Increase activity slowly   Complete by: As directed    No wound care   Complete by: As directed        Discharge Medications   Allergies as of 02/08/2022   No Known Allergies      Medication List     STOP taking these medications    carvedilol 6.25 MG tablet Commonly known as: COREG   isosorbide mononitrate 30 MG 24 hr tablet Commonly known as: IMDUR       TAKE these medications  acetaminophen 325 MG tablet Commonly known as: TYLENOL Take 325-650 mg by mouth every 6 (six) hours as needed for mild pain or headache.   amiodarone 200 MG tablet Commonly known as: PACERONE Take 200 mg by mouth in the morning.   aspirin EC 325 MG tablet Take 1 tablet (325 mg total) by mouth daily.   cephALEXin 500 MG capsule Commonly known as: KEFLEX Take 1 capsule (500 mg total) by mouth 3 (three) times daily.   famotidine 40 MG tablet Commonly known as: PEPCID Take 40 mg by mouth daily before breakfast.   Fish Oil 1200 MG Caps Take 1,200 mg by mouth daily.   multivitamin-lutein Caps capsule Take 1 capsule by mouth 2 (two) times daily.   rosuvastatin 10 MG tablet Commonly known as: CRESTOR Take 1 tablet (10 mg total) by mouth daily. What changed: when to take this   traMADol 50 MG tablet Commonly known as: ULTRAM Take 1 tablet (50 mg total) by mouth every 6 (six) hours as needed for moderate pain.                Durable Medical Equipment  (From admission, onward)           Start     Ordered   02/07/22 0000  For home use only DME Vest life vest       Comments: 3 months of Lifevest for Ischemic cardiomyopathy. Also syncope and NSVT. Order by Dr. Katrinka Blazing.   02/07/22 1131               Outstanding Labs/Studies   Zio monitor  Duration of Discharge Encounter   Greater than 30 minutes including physician time.  Signed, Laverda Page, NP 02/08/2022, 2:40 PM

## 2022-02-07 NOTE — Evaluation (Signed)
Physical Therapy Evaluation Patient Details Name: Roger Smith MRN: [id number] DOB: 20-Jan-1941 Today's Date: 02/07/2022  History of Present Illness  Pt is a 81 y/o male admitted secondary to syncope. Pt recently s/p CABG. PMH includes CHF, CAD, and HTN.  Clinical Impression  Patient evaluated by Physical Therapy with no further acute PT needs identified. All education has been completed and the patient has no further questions. Pt overall steady with mobility tasks and performed DGI tasks without difficulty. Overall at an independent level. Pt currently staying with his daughter, so she can assist as needed. See below for any follow-up Physical Therapy or equipment needs. PT is signing off. Thank you for this referral. If needs change, please re-consult.        Recommendations for follow up therapy are one component of a multi-disciplinary discharge planning process, led by the attending physician.  Recommendations may be updated based on patient status, additional functional criteria and insurance authorization.  Follow Up Recommendations No PT follow up    Assistance Recommended at Discharge Intermittent Supervision/Assistance  Patient can return home with the following  A little help with bathing/dressing/bathroom;Assistance with cooking/housework    Equipment Recommendations None recommended by PT  Recommendations for Other Services       Functional Status Assessment Patient has had a recent decline in their functional status and demonstrates the ability to make significant improvements in function in a reasonable and predictable amount of time.     Precautions / Restrictions Precautions Precautions: Fall;Sternal Precaution Booklet Issued: No Precaution Comments: Verbally reviewed sternal precautions. Restrictions Weight Bearing Restrictions: Yes (sternal)      Mobility  Bed Mobility Overal bed mobility: Modified Independent                  Transfers Overall  transfer level: Independent                      Ambulation/Gait Ambulation/Gait assistance: Independent Gait Distance (Feet): 150 Feet Assistive device: None Gait Pattern/deviations: WFL(Within Functional Limits) Gait velocity: WFL     General Gait Details: Able to perform DGI tasks without LOB. Pt asymptomatic throughout.  Stairs Stairs:  (Marched to simulate stair navigation as no stairs to practice in ED. Pt steady during marching)          Wheelchair Mobility    Modified Rankin (Stroke Patients Only)       Balance Overall balance assessment: Independent                               Standardized Balance Assessment Standardized Balance Assessment : Dynamic Gait Index   Dynamic Gait Index Level Surface: Normal Change in Gait Speed: Normal Gait with Horizontal Head Turns: Normal Gait with Vertical Head Turns: Normal Gait and Pivot Turn: Normal Step Over Obstacle: Normal Step Around Obstacles: Normal       Pertinent Vitals/Pain Pain Assessment Pain Assessment: No/denies pain    Home Living Family/patient expects to be discharged to:: Private residence Living Arrangements: Children Available Help at Discharge: Family Type of Home: House Home Access: Stairs to enter Entrance Stairs-Rails: Right Entrance Stairs-Number of Steps: 2   Home Layout: One level Home Equipment: Shower seat      Prior Function Prior Level of Function : Independent/Modified Independent                     Hand Dominance  Extremity/Trunk Assessment   Upper Extremity Assessment Upper Extremity Assessment: Overall WFL for tasks assessed    Lower Extremity Assessment Lower Extremity Assessment: Overall WFL for tasks assessed    Cervical / Trunk Assessment Cervical / Trunk Assessment: Other exceptions Cervical / Trunk Exceptions: recent sternal precautions  Communication   Communication: No difficulties  Cognition  Arousal/Alertness: Awake/alert Behavior During Therapy: WFL for tasks assessed/performed Overall Cognitive Status: Within Functional Limits for tasks assessed                                          General Comments      Exercises     Assessment/Plan    PT Assessment Patient does not need any further PT services  PT Problem List         PT Treatment Interventions      PT Goals (Current goals can be found in the Care Plan section)  Acute Rehab PT Goals Patient Stated Goal: to go home PT Goal Formulation: With patient Time For Goal Achievement: 02/07/22 Potential to Achieve Goals: Good    Frequency       Co-evaluation               AM-PAC PT "6 Clicks" Mobility  Outcome Measure Help needed turning from your back to your side while in a flat bed without using bedrails?: None Help needed moving from lying on your back to sitting on the side of a flat bed without using bedrails?: None Help needed moving to and from a bed to a chair (including a wheelchair)?: None Help needed standing up from a chair using your arms (e.g., wheelchair or bedside chair)?: None Help needed to walk in hospital room?: None Help needed climbing 3-5 steps with a railing? : A Little 6 Click Score: 23    End of Session   Activity Tolerance: Patient tolerated treatment well Patient left: in bed;with call bell/phone within reach (on stretcher in ED) Nurse Communication: Mobility status PT Visit Diagnosis: Other abnormalities of gait and mobility (R26.89)    Time: 5784-6962 PT Time Calculation (min) (ACUTE ONLY): 13 min   Charges:   PT Evaluation $PT Eval Low Complexity: 1 Low          Cindee Salt, DPT  Acute Rehabilitation Services  Office: [phone]   Lehman Prom 02/07/2022, 12:21 PM

## 2022-02-07 NOTE — Progress Notes (Signed)
RNCM spoke with Chrys Racer with [employer] to confirm status of the fax previous RNCM has sent over. Chrys Racer will check and give this RNCM a call back with status.

## 2022-02-07 NOTE — Progress Notes (Signed)
ZIO AT applied at hospital  Dr. Bing Matter to read.

## 2022-02-08 ENCOUNTER — Encounter (HOSPITAL_COMMUNITY): Payer: Self-pay | Admitting: Interventional Cardiology

## 2022-02-08 ENCOUNTER — Encounter: Payer: Self-pay | Admitting: Cardiothoracic Surgery

## 2022-02-08 DIAGNOSIS — I5022 Chronic systolic (congestive) heart failure: Secondary | ICD-10-CM | POA: Diagnosis not present

## 2022-02-08 DIAGNOSIS — R55 Syncope and collapse: Secondary | ICD-10-CM

## 2022-02-08 DIAGNOSIS — I472 Ventricular tachycardia, unspecified: Secondary | ICD-10-CM

## 2022-02-08 LAB — LIPOPROTEIN A (LPA): Lipoprotein (a): 15.3 nmol/L (ref <75.0)

## 2022-02-08 NOTE — Progress Notes (Addendum)
Progress Note  Patient Name: Roger Smith Date of Encounter: 02/08/2022  [facility] Cardiologist: Jenne Campus, MD   Subjective   Apologized for mixup with DC yesterday. Seems we are waiting for insurance approval for LifeVest.   Inpatient Medications    Scheduled Meds:  amiodarone  200 mg Oral Daily   aspirin  325 mg Oral Daily   cephALEXin  500 mg Oral Q6H   famotidine  40 mg Oral Daily   feeding supplement  237 mL Oral TID WC   heparin  5,000 Units Subcutaneous Q8H   multivitamin  1 tablet Oral BID   omega-3 acid ethyl esters  1,000 mg Oral Daily   rosuvastatin  10 mg Oral Daily   Continuous Infusions:  PRN Meds: acetaminophen, nitroGLYCERIN, ondansetron (ZOFRAN) IV, traMADol   Vital Signs    Vitals:   02/07/22 2259 02/08/22 0000 02/08/22 0251 02/08/22 0400  BP: 110/69  132/70   Pulse: 99 87 99 78  Resp: (!) 22 16 20 17   Temp: 98.6 F (37 C)  98.1 F (36.7 C)   TempSrc: Oral  Oral   SpO2: 100% 91% 96% 93%  Weight:      Height:        Intake/Output Summary (Last 24 hours) at 02/08/2022 0654 Last data filed at 02/08/2022 0249 Gross per 24 hour  Intake 600 ml  Output 450 ml  Net 150 ml      02/07/2022    7:54 PM 02/06/2022   12:52 PM 02/06/2022   11:49 AM  Last 3 Weights  Weight (lbs) 157 lb 13.6 oz 158 lb 159 lb 6 oz  Weight (kg) 71.6 kg 71.668 kg 72.292 kg      Telemetry    NSR - Personally Reviewed  ECG    No new tracing. - Personally Reviewed  Physical Exam  NAD Laying flat. GEN: No acute distress.   Neck: No JVD Cardiac: RRR, no murmurs, rubs, or gallops.  Respiratory: Clear to auscultation bilaterally. GI: Soft, nontender, non-distended  MS: No edema; No deformity. Neuro:  Nonfocal  Psych: Normal affect   Labs    High Sensitivity Troponin:   Recent Labs  Lab 02/06/22 1230 02/06/22 1631 02/06/22 1849 02/07/22 0315  TROPONINIHS 27* 24* 28* 30*     Chemistry Recent Labs  Lab 02/04/22 1407 02/06/22 1230  02/06/22 1631 02/06/22 1849  NA 137 136  --  138  K 4.8 4.2  --  4.2  CL 102 104  --  106  CO2 26 24  --  25  GLUCOSE 107* 125*  --  130*  BUN 21 15  --  13  CREATININE 0.91 0.98 0.82 0.93  CALCIUM 10.2 10.0  --  9.7  MG  --  2.4  --   --   GFRNONAA  --  >60 >60 >60  ANIONGAP  --  8  --  7    Lipids No results for input(s): "CHOL", "TRIG", "HDL", "LABVLDL", "LDLCALC", "CHOLHDL" in the last 168 hours.  Hematology Recent Labs  Lab 02/06/22 1230 02/06/22 1631 02/06/22 1849  WBC 17.0* 15.1* 14.9*  RBC 3.81* 3.33* 3.45*  HGB 12.6* 11.0* 11.3*  HCT 38.4* 34.4* 34.8*  MCV 100.8* 103.3* 100.9*  MCH 33.1 33.0 32.8  MCHC 32.8 32.0 32.5  RDW 14.7 14.7 14.8  PLT 456* 334 381   Thyroid No results for input(s): "TSH", "FREET4" in the last 168 hours.  BNPNo results for input(s): "BNP", "PROBNP" in the last 168  hours.  DDimer No results for input(s): "DDIMER" in the last 168 hours.   Radiology    DG Chest Port 1 View  Result Date: 02/07/2022 CLINICAL DATA:  Status post CABG. EXAM: PORTABLE CHEST 1 VIEW COMPARISON:  01/29/2022 FINDINGS: Status post median sternotomy CABG procedure. Normal heart size. Small left pleural effusion and pulmonary vascular congestion noted. Retrocardiac left lung base opacity is favored to compatible with either atelectasis or infiltrate. IMPRESSION: Small left pleural effusion and pulmonary vascular congestion. Left lower lobe atelectasis versus infiltrate. Electronically Signed   By: Kerby Moors M.D.   On: 02/07/2022 05:35   CT HEAD WO CONTRAST (5MM)  Result Date: 02/06/2022 CLINICAL DATA:  Syncope/presyncope. EXAM: CT HEAD WITHOUT CONTRAST TECHNIQUE: Contiguous axial images were obtained from the base of the skull through the vertex without intravenous contrast. RADIATION DOSE REDUCTION: This exam was performed according to the departmental dose-optimization program which includes automated exposure control, adjustment of the mA and/or kV according to  patient size and/or use of iterative reconstruction technique. COMPARISON:  CT head dated November 11, 2021 FINDINGS: Brain: No evidence of acute infarction, hemorrhage, hydrocephalus, extra-axial collection or mass lesion/mass effect. Chronic microvascular ischemic changes of the white matter, unchanged. 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 Hypodense structure at the right CP angle and cerebellum medullary cistern suggesting arachnoid cyst, unchanged. Vascular: No hyperdense vessel or unexpected calcification. 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 Sinuses/Orbits: No acute finding. Other: None. IMPRESSION: 1. No acute intracranial abnormality. 2. 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 Electronically Signed   By: Keane Police D.O.   On: 02/06/2022 19:07   ECHOCARDIOGRAM LIMITED  Result Date: 02/06/2022    ECHOCARDIOGRAM LIMITED REPORT   Patient Name:   FALLOU BAHL Date of Exam: 02/06/2022 Medical Rec #:  [id number]     Height:       69.0 in Accession #:    [id number]    Weight:       158.0 lb Date of Birth:  [birth year]     BSA:          1.869 m Patient Age:    81 years      BP:           114/72 mmHg Patient Gender: M             HR:           94 bpm. Exam Location:  Inpatient Procedure: 2D Echo and Limited Echo Indications:    Syncope  History:        Patient has no prior history of Echocardiogram examinations.  Sonographer:    Jefferey Pica Referring Phys: [id number] Crista Luria BHAGAT IMPRESSIONS  1. Left ventricular ejection fraction, by estimation, is 20 to 25%. The left ventricle has severely decreased function. The left ventricle demonstrates regional wall motion abnormalities (see scoring diagram/findings for description). The inferior wall is akinetic. The rest of the LV segments are severely hypokinetic. There is mild concentric left  ventricular hypertrophy.  2. Right ventricular systolic function is moderately reduced. The right ventricular size is normal.  3. Left atrial size was mildly dilated.  4. A small pericardial effusion is present. The pericardial effusion is posterior and lateral to the left ventricle.  5. The mitral valve is normal in structure.  6. The aortic valve is tricuspid. There is mild calcification of the aortic valve. There is  mild thickening of the aortic valve. Aortic valve sclerosis/calcification is present, without any evidence of aortic stenosis.  7. Aortic dilatation noted. There is borderline dilatation of the ascending aorta, measuring 36 mm. Comparison(s): Compared to prior TTE on 01/21/22, the LVEF appears slightly better at 20-25% from <20%. FINDINGS  Left Ventricle: Left ventricular ejection fraction, by estimation, is 20 to 25%. The left ventricle has severely decreased function. The left ventricle demonstrates regional wall motion abnormalities. The inferior LV wall is akinetic. The rest of the LV  walls are severely hypokinetic. The left ventricular internal cavity size was mildly dilated. There is mild concentric left ventricular hypertrophy. Right Ventricle: The right ventricular size is normal. Right ventricular systolic function is moderately reduced. Left Atrium: Left atrial size was mildly dilated. Right Atrium: Right atrial size was normal in size. Pericardium: A small pericardial effusion is present. The pericardial effusion is posterior and lateral to the left ventricle. Mitral Valve: The mitral valve is normal in structure. Tricuspid Valve: The tricuspid valve is normal in structure. Aortic Valve: The aortic valve is tricuspid. There is mild calcification of the aortic valve. There is mild thickening of the aortic valve. Aortic valve sclerosis/calcification is present, without any evidence of aortic stenosis. Pulmonic Valve: The pulmonic valve was normal in structure. Aorta: Aortic dilatation noted.  There is borderline dilatation of the ascending aorta, measuring 36 mm. LEFT VENTRICLE PLAX 2D LVIDd:         5.80 cm LVIDs:         5.10 cm LV PW:         1.30 cm LV IVS:        1.20 cm  LV Volumes (MOD) LV vol d, MOD A4C: 166.0 ml LV vol s, MOD A4C: 132.0 ml LV SV MOD A4C:     166.0 ml RIGHT VENTRICLE RV Basal diam:  2.40 cm LEFT ATRIUM             Index        RIGHT ATRIUM           Index LA diam:        3.90 cm 2.09 cm/m   RA Area:     19.80 cm LA Vol (A2C):   53.8 ml 28.78 ml/m  RA Volume:   56.20 ml  30.07 ml/m LA Vol (A4C):   69.3 ml 37.08 ml/m LA Biplane Vol: 64.8 ml 34.67 ml/m   AORTA Ao Root diam: 3.60 cm Ao Asc diam:  3.60 cm Gwyndolyn Kaufman MD Electronically signed by Gwyndolyn Kaufman MD Signature Date/Time: 02/06/2022/4:21:31 PM    Final    DG Chest Portable 1 View  Result Date: 02/06/2022 CLINICAL DATA:  Syncope EXAM: PORTABLE CHEST 1 VIEW COMPARISON:  Portable exam 1327 hours compared to 01/29/2022 FINDINGS: Borderline enlargement of cardiac silhouette post CABG. Mediastinal contours and pulmonary vascularity normal. Atherosclerotic calcification aorta. Mild LEFT basilar atelectasis and small pleural effusion. Scarring LEFT upper lobe stable. No acute infiltrate or pneumothorax. Bones demineralized. IMPRESSION: LEFT basilar atelectasis and small LEFT pleural effusion. Electronically Signed   By: Lavonia Dana M.D.   On: 02/06/2022 13:48    Cardiac Studies   EF 20-25% by echo 02/06/2022  Patient Profile     80 y.o. 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  Assessment & Plan    Recurrent near syncope: Most recent episodes are likely due to hypotension and volume  contraction from medication therapy.  Unlikely that these are related to arrhythmia since milligrams observed and no tachycardia was noted. Ischemic CM /chronic systolic heart failure: Not tolerating guideline directed therapy for systolic heart  failure due to soft blood pressure.  Currently on heart failure therapy has been discontinued.  Pressures are better.  Patient feels stronger.  No current evidence of volume overload.  We will need to monitor closely and resume heart failure therapy as patient strengthens and appetite improves. Coronary artery disease with recent CABG: No angina.  Continue aggressive risk factor modification with high intensity statin therapy. Hypotension: Resolved off heart failure therapy. PVC/ VT: Needs long-term live monitor and LifeVest before discharge.  Difficulty getting the LifeVest yesterday due to insurance company denying approval.  Seems that this has been reconsidered and will likely be get the best.  If it is not approved, will be discharged without LifeVest but this would be of the patient's peril. Debility: continue [facility] outpatient home health.  For questions or updates, please contact [place] Please consult [email or web address] for contact info under        Signed, Sinclair Grooms, MD  02/08/2022, 6:54 AM

## 2022-02-08 NOTE — TOC Progression Note (Addendum)
Transition of Care [facility]) - Progression Note    Patient Details  Name: Roger Smith MRN: [id number] Date of Birth: [birth year]  Transition of Care [facility]) CM/SW Contact  Beckie Busing, RN Phone Number:[phone]  02/08/2022, 9:05 AM  Clinical Narrative:    CM attempted to reach [place] with [employer] [phone] to determine status of life vest . No answer. Voicemail left. CM has reached out to [place] at [facility] [phone] for life vest follow up with no answer. Voicemail has been left.   0951 CM received return call from [place] at [facility]. Per [place] [facility]  we are awaiting intake to review information and obtain approval.   1008 Patient is active with [facility] for [facility] RN. Resumption of care orders have been entered. CM has verified with Kandee Keen at [place].  1300 CM received call from [place] at [place] stating that patient insurance information was not received and the second page of order was cut off. Irving Burton requesting CM to refax face sheet and life vest order to [phone]. Both have been faxed. Per Irving Burton the plan is still to get life vest to patient today.   1400 PLEASE DO NOT DISCHARGE PATIENT UNTIL HE RECEIVES HIS LIFE VEST       Expected Discharge Plan and Services           Expected Discharge Date: 02/13/22                                     Social Determinants of Health (SDOH) Interventions    Readmission Risk Interventions     No data to display

## 2022-02-08 NOTE — Plan of Care (Signed)
  Problem: Health Behavior/Discharge Planning: Goal: Ability to manage health-related needs will improve Outcome: Progressing   Problem: Clinical Measurements: Goal: Diagnostic test results will improve Outcome: Progressing Goal: Respiratory complications will improve Outcome: Progressing Goal: Cardiovascular complication will be avoided Outcome: Progressing   Problem: Activity: Goal: Risk for activity intolerance will decrease Outcome: Progressing   Problem: Coping: Goal: Level of anxiety will decrease Outcome: Progressing   Problem: Elimination: Goal: Will not experience complications related to urinary retention Outcome: Progressing   Problem: Pain Managment: Goal: General experience of comfort will improve Outcome: Progressing   Problem: Safety: Goal: Ability to remain free from injury will improve Outcome: Progressing

## 2022-02-08 NOTE — Progress Notes (Addendum)
      [address]       [place], [place] [address]             [phone]            Subjective: Patient eating breakfast this am.  Objective: Vital signs in last 24 hours: Temp:  [97.8 F (36.6 C)-98.6 F (37 C)] 98.5 F (36.9 C) (06/09 0723) Pulse Rate:  [78-100] 100 (06/09 0723) Cardiac Rhythm: Normal sinus rhythm (06/08 2128) Resp:  [13-22] 16 (06/09 0723) BP: (109-148)/(69-94) 118/79 (06/09 0723) SpO2:  [91 %-100 %] 98 % (06/09 0723) Weight:  [71.6 kg] 71.6 kg (06/08 1954)   Current Weight  02/07/22 71.6 kg       Intake/Output from previous day: 06/08 0701 - 06/09 0700 In: 600 [P.O.:600] Out: 450 [Urine:450]   Physical Exam:  Cardiovascular: RRR Pulmonary: Clear to auscultation bilaterally Abdomen: Soft, non tender, bowel sounds present. Extremities: Mild RLE (mild hematoma, no sign infection) Wounds: Clean and dry.  No erythema or signs of infection.  Lab Results: CBC: Recent Labs    02/06/22 1631 02/06/22 1849  WBC 15.1* 14.9*  HGB 11.0* 11.3*  HCT 34.4* 34.8*  PLT 334 381    BMET:  Recent Labs    02/06/22 1230 02/06/22 1631 02/06/22 1849  NA 136  --  138  K 4.2  --  4.2  CL 104  --  106  CO2 24  --  25  GLUCOSE 125*  --  130*  BUN 15  --  13  CREATININE 0.98 0.82 0.93  CALCIUM 10.0  --  9.7     PT/INR:  Lab Results  Component Value Date   INR 1.3 (H) 01/24/2022   INR 1.5 (H) 01/23/2022   INR 1.0 01/21/2022   ABG:  INR: Will add last result for INR, ABG once components are confirmed Will add last 4 CBG results once components are confirmed  Assessment/Plan:  1. CV - SR. On Amiodarone 200 mg daily. Cardiology trying to arrange  LifeVest. 2.  Pulmonary - On room air. CXR this am shows small left pleural effusion, left base atelectasis, and pulmonary vascular congestion. Encourage incentive spirometer 3.  Expected post op acute blood loss anemia - H and H yesterday stable at 11.3 and 34.8 4. Leukocytosis-WBC  yesterday decreased to 14,900. Given Keflex for UTI 5. 2 syncopal episodes at home. Likely related to hypotension/dehydration as work up thus far negative for another etiology. 6. Will likely be discharged once LifeVest has been arranged. Follow up in our office Monday for staple removal.   Donielle M ZimmermanPA-C 7:33 AM  patient examined and medical record reviewed,agree with above note. Dahlia Byes 02/08/2022

## 2022-02-09 DIAGNOSIS — R55 Syncope and collapse: Secondary | ICD-10-CM

## 2022-02-09 NOTE — Plan of Care (Signed)
  Problem: Education: Goal: Knowledge of General Education information will improve Description: Including pain rating scale, medication(s)/side effects and non-pharmacologic comfort measures Outcome: Adequate for Discharge   Problem: Health Behavior/Discharge Planning: Goal: Ability to manage health-related needs will improve Outcome: Adequate for Discharge   Problem: Clinical Measurements: Goal: Ability to maintain clinical measurements within normal limits will improve Outcome: Adequate for Discharge Goal: Will remain free from infection Outcome: Adequate for Discharge Goal: Diagnostic test results will improve Outcome: Adequate for Discharge Goal: Respiratory complications will improve Outcome: Adequate for Discharge Goal: Cardiovascular complication will be avoided Outcome: Adequate for Discharge   Problem: Activity: Goal: Risk for activity intolerance will decrease Outcome: Adequate for Discharge   Problem: Nutrition: Goal: Adequate nutrition will be maintained Outcome: Adequate for Discharge   Problem: Coping: Goal: Level of anxiety will decrease Outcome: Adequate for Discharge   Problem: Elimination: Goal: Will not experience complications related to bowel motility Outcome: Adequate for Discharge Goal: Will not experience complications related to urinary retention Outcome: Adequate for Discharge   Problem: Elimination: Goal: Will not experience complications related to urinary retention Outcome: Adequate for Discharge   

## 2022-02-09 NOTE — Progress Notes (Signed)
      [address]       [place] [address]             [phone]            Subjective: Patient going home today. He has no specific complaint this am  Objective: Vital signs in last 24 hours: Temp:  [97.6 F (36.4 C)-98.7 F (37.1 C)] 98.3 F (36.8 C) (06/10 0713) Pulse Rate:  [90-96] 92 (06/10 0713) Cardiac Rhythm: Normal sinus rhythm (06/10 0700) Resp:  [18-20] 18 (06/10 0713) BP: (109-144)/(69-90) 114/72 (06/10 0713) SpO2:  [93 %-98 %] 95 % (06/10 0713)   Current Weight  02/07/22 71.6 kg       Intake/Output from previous day: 06/09 0701 - 06/10 0700 In: 360 [P.O.:360] Out: -    Physical Exam:  Cardiovascular: RRR Pulmonary: Clear to auscultation bilaterally Abdomen: Soft, non tender, bowel sounds present. Extremities: Mild RLE (mild hematoma, no sign infection) Wounds: Clean and dry.  No erythema or signs of infection.  Lab Results: CBC: Recent Labs    02/06/22 1631 02/06/22 1849  WBC 15.1* 14.9*  HGB 11.0* 11.3*  HCT 34.4* 34.8*  PLT 334 381    BMET:  Recent Labs    02/06/22 1230 02/06/22 1631 02/06/22 1849  NA 136  --  138  K 4.2  --  4.2  CL 104  --  106  CO2 24  --  25  GLUCOSE 125*  --  130*  BUN 15  --  13  CREATININE 0.98 0.82 0.93  CALCIUM 10.0  --  9.7     PT/INR:  Lab Results  Component Value Date   INR 1.3 (H) 01/24/2022   INR 1.5 (H) 01/23/2022   INR 1.0 01/21/2022   ABG:  INR: Will add last result for INR, ABG once components are confirmed Will add last 4 CBG results once components are confirmed  Assessment/Plan:  1. CV - SR. On Amiodarone 200 mg daily. Cardiology arranged life vest and Zio monitor 2.  Pulmonary - On room air. CXR this am shows small left pleural effusion, left base atelectasis, and pulmonary vascular congestion. Encourage incentive spirometer 3.  Expected post op acute blood loss anemia - H and H yesterday stable at 11.3 and 34.8 4. Leukocytosis-WBC yesterday decreased  to 14,900. Given Keflex for UTI 5. 2 syncopal episodes at home. Likely related to hypotension/dehydration as work up thus far negative for another etiology. 6. Discharge. Follow up in our office Monday for staple removal.   Feliberto Stockley M ZimmermanPA-C 8:56 AM

## 2022-02-09 NOTE — TOC Transition Note (Signed)
Transition of Care [facility]) - CM/SW Discharge Note   Patient Details  Name: Roger Smith MRN: [id number] Date of Birth: [birth year]  Transition of Care [facility]) CM/SW Contact:  Tiajuana Amass [place], [place] Phone Number: [phone] 02/09/2022, 9:32 AM   Clinical Narrative:  [facility] team for discharge planning. Spoke to patient and spouse at the bedside. Both were very pleasant. They are awaiting Life Vest delivery prior to discharge to home. They voiced no concerns. Will continue to monitor for further needs.     Final next level of care: Home/Self Care Barriers to Discharge: No Barriers Identified   Patient Goals and CMS Choice Patient states their goals for this hospitalization and ongoing recovery are:: Return home to his wife.      Discharge Placement                       Discharge Plan and Services                DME Arranged: Life vest DME Agency: [employer]       HH Arranged: NA HH Agency: [place] Date [facility] Agency Contacted: 02/08/22 Time [place] Agency Contacted: [id number] Representative spoke with at [place]: Tommi Rumps  Social Determinants of Health (SDOH) Interventions Housing Interventions: Intervention Not Indicated Social Connections Interventions: Intervention Not Indicated Transportation Interventions: Intervention Not Indicated   Readmission Risk Interventions     No data to display

## 2022-02-11 ENCOUNTER — Ambulatory Visit (INDEPENDENT_AMBULATORY_CARE_PROVIDER_SITE_OTHER): Payer: Self-pay | Admitting: *Deleted

## 2022-02-11 ENCOUNTER — Telehealth: Payer: Self-pay | Admitting: Cardiology

## 2022-02-11 ENCOUNTER — Ambulatory Visit: Payer: Medicare Other | Admitting: Cardiology

## 2022-02-11 VITALS — BP 117/71 | HR 91

## 2022-02-11 DIAGNOSIS — Z4802 Encounter for removal of sutures: Secondary | ICD-10-CM

## 2022-02-11 LAB — CULTURE, BLOOD (ROUTINE X 2)
Culture: NO GROWTH
Culture: NO GROWTH
Special Requests: ADEQUATE

## 2022-02-11 NOTE — Progress Notes (Signed)
Patient arrived for nurse visit to remove sutures post-CABG 5/24 with Dr. Donata Clay.  Remaining staples removed with no signs or symptoms of infection noted.  Incision well approximated. Patient tolerated suture removal well.  Medication list reviewed with patient and family.  Per daughter, patient's SBP was in the 90's this morning but after ambulation and movement came back up to 108. In the office, BP was 117/71, HR 91.  Multiple questions answered during visit.

## 2022-02-11 NOTE — Telephone Encounter (Signed)
Pt's POA wants to know if the pt can be scheduled with Dr. Dulce Sellar sooner. He had an appt today but was cancelled today with Dr. Kirtland Bouchard. Per life vest, pt needs to be seen.

## 2022-02-18 ENCOUNTER — Ambulatory Visit (INDEPENDENT_AMBULATORY_CARE_PROVIDER_SITE_OTHER): Payer: Self-pay | Admitting: Cardiothoracic Surgery

## 2022-02-18 ENCOUNTER — Ambulatory Visit
Admission: RE | Admit: 2022-02-18 | Discharge: 2022-02-18 | Disposition: A | Discharge status: Home / Self Care | Payer: Medicare Other | Source: Ambulatory Visit | Attending: Cardiothoracic Surgery | Admitting: Cardiothoracic Surgery

## 2022-02-18 ENCOUNTER — Encounter: Payer: Self-pay | Admitting: Cardiothoracic Surgery

## 2022-02-18 VITALS — BP 121/73 | HR 90 | Resp 20 | Ht 69.0 in | Wt 161.0 lb

## 2022-02-18 DIAGNOSIS — Z951 Presence of aortocoronary bypass graft: Secondary | ICD-10-CM

## 2022-02-18 DIAGNOSIS — Z09 Encounter for follow-up examination after completed treatment for conditions other than malignant neoplasm: Secondary | ICD-10-CM

## 2022-02-18 HISTORY — DX: Encounter for follow-up examination after completed treatment for conditions other than malignant neoplasm: Z09

## 2022-02-18 NOTE — Progress Notes (Signed)
    HPI:  Patient returns for routine postoperative follow-up having undergone multivessel bypass grafting for ischemic cardiomyopathy 1 month ago. The patient's early postoperative recovery while in the hospital was notable for atrial fibrillation which converted to sinus rhythm with amiodarone. Since hospital discharge the patient had 2-3 syncopal episodes possibly related to his medications and reduced LV function or cardiac arrhythmia.  Following readmission he has been on a Zio patch which will be turned in in 3 days.  He is also on a vest because of concern over ventricular arrhythmias.  Since his last visit he feels fine has been walking 20 minutes twice a day.  He denies any angina or symptoms of heart failure.  His weight is increasing as is his appetite and exercise tolerance.  He has had no more presyncopal episodes.  He understands that he should not drive until after the issue about potential ventricular arrhythmias is resolved.  Chest x-ray today is clear. Current Outpatient Medications  Medication Sig Dispense Refill   acetaminophen (TYLENOL) 325 MG tablet Take 325-650 mg by mouth every 6 (six) hours as needed for mild pain or headache.     amiodarone (PACERONE) 200 MG tablet Take 200 mg by mouth in the morning.     aspirin EC 325 MG tablet Take 1 tablet (325 mg total) by mouth daily.     cephALEXin (KEFLEX) 500 MG capsule Take 1 capsule (500 mg total) by mouth 3 (three) times daily. 21 capsule 0   famotidine (PEPCID) 40 MG tablet Take 40 mg by mouth daily before breakfast.     multivitamin-lutein (OCUVITE-LUTEIN) CAPS capsule Take 1 capsule by mouth 2 (two) times daily.     Omega-3 Fatty Acids (FISH OIL) 1200 MG CAPS Take 1,200 mg by mouth daily.     rosuvastatin (CRESTOR) 10 MG tablet Take 1 tablet (10 mg total) by mouth daily. (Patient taking differently: Take 10 mg by mouth at bedtime.) 90 tablet 3   traMADol (ULTRAM) 50 MG tablet Take 1 tablet (50 mg total) by mouth every 6  (six) hours as needed for moderate pain. 28 tablet 0   No current facility-administered medications for this visit.    Physical Exam Blood pressure 121/73, pulse 90, resp. rate 20, height 5\' 9"  (1.753 m), weight 161 lb (73 kg), SpO2 95 %.       Exam    General- alert and comfortable    Neck- no JVD, no cervical adenopathy palpable, no carotid bruit   Lungs- clear without rales, wheezes   Cor- regular rate and rhythm, no murmur , gallop   Abdomen- soft, non-tender   Extremities - warm, non-tender, minimal edema   Neuro- oriented, appropriate, no focal weakness   Diagnostic Tests: Chest x-ray clear  Impression: Doing well 1 month after multivessel CABG for ischemic cardiomyopathy.  He has maintained sinus rhythm.  He has had no more presyncopal episodes. Patient can now lift up to 10 to 15 pounds. He will stop his amiodarone when a prescription runs out.  No driving until the assessment of the arrhythmias is complete. Plan: Return here as needed.  Patient understands importance of compliance with meds and heart healthy diet and lifestyle.  He understands after 3 months following surgery (mid August) all restrictions and sternal precautions are lifted.   September, MD [facility] [phone]

## 2022-02-21 MED FILL — Medication: Qty: 1 | Status: AC

## 2022-02-26 ENCOUNTER — Encounter (HOSPITAL_COMMUNITY): Payer: Self-pay

## 2022-02-26 ENCOUNTER — Ambulatory Visit (HOSPITAL_COMMUNITY)
Admit: 2022-02-26 | Discharge: 2022-02-26 | Disposition: A | Discharge status: Home / Self Care | Payer: Medicare Other | Source: Ambulatory Visit | Attending: Family Medicine | Admitting: Family Medicine

## 2022-02-26 VITALS — BP 134/80 | HR 87 | Wt 162.2 lb

## 2022-02-26 DIAGNOSIS — I4891 Unspecified atrial fibrillation: Secondary | ICD-10-CM | POA: Insufficient documentation

## 2022-02-26 DIAGNOSIS — I9789 Other postprocedural complications and disorders of the circulatory system, not elsewhere classified: Secondary | ICD-10-CM | POA: Diagnosis not present

## 2022-02-26 DIAGNOSIS — Z951 Presence of aortocoronary bypass graft: Secondary | ICD-10-CM | POA: Insufficient documentation

## 2022-02-26 DIAGNOSIS — Z8673 Personal history of transient ischemic attack (TIA), and cerebral infarction without residual deficits: Secondary | ICD-10-CM | POA: Diagnosis not present

## 2022-02-26 DIAGNOSIS — I110 Hypertensive heart disease with heart failure: Secondary | ICD-10-CM | POA: Diagnosis not present

## 2022-02-26 DIAGNOSIS — Z79899 Other long term (current) drug therapy: Secondary | ICD-10-CM | POA: Diagnosis not present

## 2022-02-26 DIAGNOSIS — I4510 Unspecified right bundle-branch block: Secondary | ICD-10-CM | POA: Diagnosis not present

## 2022-02-26 DIAGNOSIS — I959 Hypotension, unspecified: Secondary | ICD-10-CM | POA: Insufficient documentation

## 2022-02-26 DIAGNOSIS — I493 Ventricular premature depolarization: Secondary | ICD-10-CM | POA: Diagnosis not present

## 2022-02-26 DIAGNOSIS — E785 Hyperlipidemia, unspecified: Secondary | ICD-10-CM | POA: Diagnosis not present

## 2022-02-26 DIAGNOSIS — I2510 Atherosclerotic heart disease of native coronary artery without angina pectoris: Secondary | ICD-10-CM | POA: Insufficient documentation

## 2022-02-26 DIAGNOSIS — I472 Ventricular tachycardia, unspecified: Secondary | ICD-10-CM

## 2022-02-26 DIAGNOSIS — R55 Syncope and collapse: Secondary | ICD-10-CM

## 2022-02-26 DIAGNOSIS — E860 Dehydration: Secondary | ICD-10-CM | POA: Insufficient documentation

## 2022-02-26 DIAGNOSIS — I5022 Chronic systolic (congestive) heart failure: Secondary | ICD-10-CM | POA: Diagnosis not present

## 2022-02-26 LAB — BASIC METABOLIC PANEL
Anion gap: 4 — ABNORMAL LOW (ref 5–15)
BUN: 15 mg/dL (ref 8–23)
CO2: 27 mmol/L (ref 22–32)
Calcium: 10.1 mg/dL (ref 8.9–10.3)
Chloride: 109 mmol/L (ref 98–111)
Creatinine, Ser: 0.83 mg/dL (ref 0.61–1.24)
GFR, Estimated: >60 mL/min (ref >60)
Glucose, Bld: 104 mg/dL — ABNORMAL HIGH (ref 70–99)
Potassium: 4.5 mmol/L (ref 3.5–5.1)
Sodium: 140 mmol/L (ref 135–145)

## 2022-02-26 MED ORDER — LOSARTAN POTASSIUM 25 MG PO TABS
12.5000 mg | ORAL_TABLET | Freq: Every day | ORAL | 3 refills | Status: DC
Start: 2022-02-26 — End: 2022-05-28

## 2022-03-04 ENCOUNTER — Telehealth (HOSPITAL_COMMUNITY): Payer: Self-pay | Admitting: *Deleted

## 2022-03-04 NOTE — Telephone Encounter (Signed)
Pts wife left vm asking if pt could d/c life vest/ per pts wife they were waiting for zio results to see if pt could take off life vest.  Routed to [employer]

## 2022-03-04 NOTE — Telephone Encounter (Signed)
Called pt no answer °

## 2022-03-06 ENCOUNTER — Telehealth: Payer: Self-pay

## 2022-03-06 DIAGNOSIS — I472 Ventricular tachycardia, unspecified: Secondary | ICD-10-CM

## 2022-03-06 DIAGNOSIS — I493 Ventricular premature depolarization: Secondary | ICD-10-CM

## 2022-03-06 NOTE — Telephone Encounter (Signed)
Spoke with pt's daughter - per DPR. Reviewed monitor results. Will refer to EP for eval and removal of Life Vest.Daughter verbalized understanding and had no further questions.

## 2022-03-09 DIAGNOSIS — I509 Heart failure, unspecified: Secondary | ICD-10-CM | POA: Diagnosis not present

## 2022-03-11 ENCOUNTER — Other Ambulatory Visit (HOSPITAL_COMMUNITY): Payer: Self-pay | Admitting: Cardiology

## 2022-03-11 DIAGNOSIS — I420 Dilated cardiomyopathy: Secondary | ICD-10-CM | POA: Diagnosis not present

## 2022-03-11 DIAGNOSIS — I252 Old myocardial infarction: Secondary | ICD-10-CM | POA: Diagnosis not present

## 2022-03-12 DIAGNOSIS — I8393 Asymptomatic varicose veins of bilateral lower extremities: Secondary | ICD-10-CM | POA: Diagnosis not present

## 2022-03-12 DIAGNOSIS — I252 Old myocardial infarction: Secondary | ICD-10-CM | POA: Diagnosis not present

## 2022-03-12 DIAGNOSIS — I110 Hypertensive heart disease with heart failure: Secondary | ICD-10-CM | POA: Diagnosis not present

## 2022-03-12 DIAGNOSIS — I2510 Atherosclerotic heart disease of native coronary artery without angina pectoris: Secondary | ICD-10-CM | POA: Diagnosis not present

## 2022-03-12 DIAGNOSIS — I452 Bifascicular block: Secondary | ICD-10-CM | POA: Diagnosis not present

## 2022-03-12 DIAGNOSIS — Z951 Presence of aortocoronary bypass graft: Secondary | ICD-10-CM | POA: Diagnosis not present

## 2022-03-12 DIAGNOSIS — N390 Urinary tract infection, site not specified: Secondary | ICD-10-CM | POA: Diagnosis not present

## 2022-03-12 DIAGNOSIS — I493 Ventricular premature depolarization: Secondary | ICD-10-CM | POA: Diagnosis not present

## 2022-03-12 DIAGNOSIS — Z7982 Long term (current) use of aspirin: Secondary | ICD-10-CM | POA: Diagnosis not present

## 2022-03-12 DIAGNOSIS — I959 Hypotension, unspecified: Secondary | ICD-10-CM | POA: Diagnosis not present

## 2022-03-12 DIAGNOSIS — I4891 Unspecified atrial fibrillation: Secondary | ICD-10-CM | POA: Diagnosis not present

## 2022-03-12 DIAGNOSIS — I255 Ischemic cardiomyopathy: Secondary | ICD-10-CM | POA: Diagnosis not present

## 2022-03-12 DIAGNOSIS — Z48812 Encounter for surgical aftercare following surgery on the circulatory system: Secondary | ICD-10-CM | POA: Diagnosis not present

## 2022-03-12 DIAGNOSIS — E785 Hyperlipidemia, unspecified: Secondary | ICD-10-CM | POA: Diagnosis not present

## 2022-03-12 DIAGNOSIS — I509 Heart failure, unspecified: Secondary | ICD-10-CM | POA: Diagnosis not present

## 2022-03-12 DIAGNOSIS — K219 Gastro-esophageal reflux disease without esophagitis: Secondary | ICD-10-CM | POA: Diagnosis not present

## 2022-03-25 ENCOUNTER — Encounter (HOSPITAL_COMMUNITY): Payer: Self-pay

## 2022-03-25 ENCOUNTER — Ambulatory Visit (HOSPITAL_COMMUNITY)
Admission: RE | Admit: 2022-03-25 | Discharge: 2022-03-25 | Disposition: A | Discharge status: Home / Self Care | Payer: Medicare Other | Source: Ambulatory Visit | Attending: Family Medicine | Admitting: Family Medicine

## 2022-03-25 VITALS — BP 150/92 | HR 77 | Wt 162.4 lb

## 2022-03-25 DIAGNOSIS — I4891 Unspecified atrial fibrillation: Secondary | ICD-10-CM | POA: Insufficient documentation

## 2022-03-25 DIAGNOSIS — I2510 Atherosclerotic heart disease of native coronary artery without angina pectoris: Secondary | ICD-10-CM | POA: Insufficient documentation

## 2022-03-25 DIAGNOSIS — Z7982 Long term (current) use of aspirin: Secondary | ICD-10-CM | POA: Insufficient documentation

## 2022-03-25 DIAGNOSIS — Z79899 Other long term (current) drug therapy: Secondary | ICD-10-CM | POA: Diagnosis not present

## 2022-03-25 DIAGNOSIS — I493 Ventricular premature depolarization: Secondary | ICD-10-CM | POA: Insufficient documentation

## 2022-03-25 DIAGNOSIS — E785 Hyperlipidemia, unspecified: Secondary | ICD-10-CM | POA: Diagnosis not present

## 2022-03-25 DIAGNOSIS — R55 Syncope and collapse: Secondary | ICD-10-CM | POA: Insufficient documentation

## 2022-03-25 DIAGNOSIS — Z7902 Long term (current) use of antithrombotics/antiplatelets: Secondary | ICD-10-CM | POA: Diagnosis not present

## 2022-03-25 DIAGNOSIS — Z951 Presence of aortocoronary bypass graft: Secondary | ICD-10-CM | POA: Diagnosis not present

## 2022-03-25 DIAGNOSIS — I110 Hypertensive heart disease with heart failure: Secondary | ICD-10-CM | POA: Insufficient documentation

## 2022-03-25 DIAGNOSIS — I4510 Unspecified right bundle-branch block: Secondary | ICD-10-CM | POA: Insufficient documentation

## 2022-03-25 DIAGNOSIS — Z8673 Personal history of transient ischemic attack (TIA), and cerebral infarction without residual deficits: Secondary | ICD-10-CM | POA: Diagnosis not present

## 2022-03-25 DIAGNOSIS — I9789 Other postprocedural complications and disorders of the circulatory system, not elsewhere classified: Secondary | ICD-10-CM

## 2022-03-25 DIAGNOSIS — I5022 Chronic systolic (congestive) heart failure: Secondary | ICD-10-CM | POA: Insufficient documentation

## 2022-03-25 DIAGNOSIS — I472 Ventricular tachycardia, unspecified: Secondary | ICD-10-CM | POA: Insufficient documentation

## 2022-03-25 LAB — BASIC METABOLIC PANEL
Anion gap: 5 (ref 5–15)
BUN: 15 mg/dL (ref 8–23)
CO2: 25 mmol/L (ref 22–32)
Calcium: 9.7 mg/dL (ref 8.9–10.3)
Chloride: 108 mmol/L (ref 98–111)
Creatinine, Ser: 0.86 mg/dL (ref 0.61–1.24)
GFR, Estimated: >60 mL/min (ref >60)
Glucose, Bld: 93 mg/dL (ref 70–99)
Potassium: 4.5 mmol/L (ref 3.5–5.1)
Sodium: 138 mmol/L (ref 135–145)

## 2022-03-25 MED ORDER — SPIRONOLACTONE 25 MG PO TABS: 12.5000 mg | ORAL_TABLET | Freq: Every day | ORAL | 0 refills | Status: DC

## 2022-03-25 MED ORDER — CARVEDILOL 3.125 MG PO TABS: 3.1250 mg | ORAL_TABLET | Freq: Two times a day (BID) | ORAL | 1 refills | Status: DC

## 2022-03-25 NOTE — Progress Notes (Signed)
ADVANCED HF CLINIC NOTE  PCP: Paulina Fusi, MD HF Cardiologist: Dr. Shirlee Latch  HPI: Roger Smith is a 81 y.o. male with hx HTN, HLD, RBBB, hx TIA (noted in problem list). He is a nondiabetic and nonsmoker.    Had exertional CP and syncopal episode in church prompting evaluation by Cardiology in [place]. Echo at [facility] with EF 50% and inferior hypokinesis. Coronary calcium score 700. Subsequently had LHC which demonstrated 3 v CAD including LM disease. Echo at [facility] prior to CABG: EF < 20%, RV moderately reduced. He underwent CABG X 4 (LIMA to LAD, SVG to diagonal, left radial to OM, SVG to PDA) 01/23/22. Patient had expected POBLA and required transfusion. He did have atrial fibrillation and converted to NSR with IV amiodarone >> later switched to PO. PVCs also noted on tele. Diuresed with lasix and metolazone. Discharged on amiodarone 200 BID (plan to decrease 200 daily at f/u), Coreg 6.25 BID, Farxiga 10 mg daily, Furosemide 20 mg daily X 5 days then stop.    Seen in CT surgery 06/05 for suture removal. Became clammy and diaphoretic and had episode of decreased responsiveness. He was hypotensive with BP 79/54, improved to 113/64 on recheck. Coreg held for a day then given instructions to restart at lower dose 3.215 BID and stopped [place]. Diuretic discontinued. Amiodarone reduced to 200 mg daily. Had labs done, WBC elevated at 19K, Hgb 12.5, Scr 0.91, CO2 26, K 4.8, Na 137, glucose 107.  Started on abx for possible UTI.   Follow up 6/23 in [facility], had syncopal episode, rapid response and code called. BP 90 on doppler, CBG 74. Given 1L NS and became more responsive. Evaluated by Dr. Shirlee Latch and transported to ED for further work up. Felt episode was secondary to hypotension and dehydration, however this was 2nd syncopal episode so Zio and LifeVest arranged.  Also found to have UTI and treated with Keflex. Echo showed EF 20-25%. Carvedilol and Imdur stopped during admission.  Today he returns for  HF follow up with his daughter. Overall feeling fine. Walks outside several times a day and has no dyspnea with this. Denies palpitations, abnormal bleeding, CP, dizziness, edema, or PND/Orthopnea. Appetite ok. No fever or chills. Weight at home 162 pounds. Taking all medications. BP 110-20s/70s. No issues with LifeVEst, wants to take it off.   ECG (personally reviewed): NSR rBBB, 73 bpm  LifeVest interrogation (personally reviewed): unable to interrogate LifeVest today in clinic.  Labs (6/23): K 4.2, creatinine 0.93, hgb 11.3 Labs (7/23): K 4.3, creatinine 0.93  ROS: All systems reviewed and negative except as per HPI.   Past Medical History:  Diagnosis Date   Chronic GERD    Coronary artery disease    Frequent PVCs    Hyperlipidemia    Myocardial infarction (HCC)    RBBB (right bundle branch block with left anterior fascicular block)    Syncope and collapse    TIA (transient ischemic attack)    Current Outpatient Medications  Medication Sig Dispense Refill   acetaminophen (TYLENOL) 325 MG tablet Take 325-650 mg by mouth every 6 (six) hours as needed for mild pain or headache.     amiodarone (PACERONE) 200 MG tablet Take 200 mg by mouth in the morning.     aspirin EC 325 MG tablet Take 1 tablet (325 mg total) by mouth daily.     famotidine (PEPCID) 40 MG tablet Take 40 mg by mouth daily before breakfast.     losartan (COZAAR) 25 MG tablet  Take 0.5 tablets (12.5 mg total) by mouth at bedtime. 45 tablet 3   multivitamin-lutein (OCUVITE-LUTEIN) CAPS capsule Take 1 capsule by mouth 2 (two) times daily.     Omega-3 Fatty Acids (FISH OIL) 1200 MG CAPS Take 1,200 mg by mouth daily.     rosuvastatin (CRESTOR) 10 MG tablet Take 10 mg by mouth at bedtime.     No current facility-administered medications for this encounter.   No Known Allergies  Social History   Socioeconomic History   Marital status: Widowed    Spouse name: Not on file   Number of children: Not on file   Years of  education: Not on file   Highest education level: Not on file  Occupational History   Not on file  Tobacco Use   Smoking status: Never   Smokeless tobacco: Never  Vaping Use   Vaping Use: Never used  Substance and Sexual Activity   Alcohol use: Never   Drug use: Never   Sexual activity: Not on file  Other Topics Concern   Not on file  Social History Narrative   Not on file   Social Determinants of Health   Financial Resource Strain: Not on file  Food Insecurity: Not on file  Transportation Needs: No Transportation Needs (02/09/2022)   PRAPARE - Administrator, Civil Service (Medical): No    Lack of Transportation (Non-Medical): No  Physical Activity: Not on file  Stress: Not on file  Social Connections: Unknown (02/09/2022)   Social Connection and Isolation Panel [NHANES]    Frequency of Communication with Friends and Family: Twice a week    Frequency of Social Gatherings with Friends and Family: Twice a week    Attends Religious Services: Not on Marketing executive or Organizations: Yes    Attends Banker Meetings: 1 to 4 times per year    Marital Status: Married  Catering manager Violence: Not on file   Family History  Problem Relation Age of Onset   Heart attack Mother    Heart disease Mother    Stomach cancer Maternal Grandfather    BP (!) 150/92   Pulse 77   Wt 73.7 kg (162 lb 6.4 oz)   SpO2 100%   BMI 23.98 kg/m   Wt Readings from Last 3 Encounters:  03/25/22 73.7 kg (162 lb 6.4 oz)  02/26/22 73.6 kg (162 lb 3.2 oz)  02/18/22 73 kg (161 lb)   PHYSICAL EXAM: General:  NAD. No resp difficulty HEENT: Normal Neck: Supple. No JVD. Carotids 2+ bilat; no bruits. No lymphadenopathy or thryomegaly appreciated. Cor: PMI nondisplaced. Regular rate & rhythm. No rubs, gallops or murmurs. Lungs: Clear, LifeVest in place. Abdomen: Soft, nontender, nondistended. No hepatosplenomegaly. No bruits or masses. Good bowel  sounds. Extremities: No cyanosis, clubbing, rash, edema Neuro: Alert & oriented x 3, cranial nerves grossly intact. Moves all 4 extremities w/o difficulty. Affect pleasant.  ASSESSMENT & PLAN: 1. Chronic systolic CHF/iCM: - Echo (3/23 at [facility]): Report indicates EF 50% with inferior hypokinesis - Echo (5/23): EF < 20%, RV moderately reduced - LHC (5/23): 3 v CAD including moderate LM disease - S/p CABG X4. - Stable NYHA II symptoms, he is not volume overloaded today. He does not need daily loops. - Start carvedilol 3.125 mg bid. - Start spiro 12.5 mg daily. BMET today, repeat in 1 week. - Continue losartan 12.5 mg q hs.  - No SGLT2i yet with recent UTI. - Repeat  echo next visit.  2. CAD s/p CABG X 4 5/23: - Continue ASA and statin. - No chest pain. - He is planning on starting CR when he is able to remove LifeVest.   3. Post-operative atrial fibrillation: - NSR on ECG today. - Will need anticoagulation if recurrence   4. PVCs/VT: - 14 day holter (4/23): Multiple episodes VT, longest 12.8 seconds, 7.4% PVC burden - Continue amiodarone 200 mg daily until Rx out, then can stop, per PVT. - Zio 14 day (6/23) showed frequent ventricular ectopy (6.3%) and several runs of VT. - Continue LifeVest as above. We discussed this again today. He has been referred to EP. Suspect will plan to re-evaluate after repeat echo, then could consider ICD if EF < 35%.   5. Syncope: 2 events within a week. Felt 2/2 to hypotension and dehydration.  - No further events. - Continue LifeVest as above.  Follow up in 2 months with Dr. Shirlee Latch + echo, as scheduled.   Prince Rome, FNP-BC 03/25/22

## 2022-03-25 NOTE — Patient Instructions (Signed)
Thank you for coming in today  Labs were done today, if any labs are abnormal the clinic will call you No news is good news  Your physician recommends that you return for lab work in:  You have been given a prescription for BMP draw in one week please go to your local lab office to get labs drawn  START Carvedilol 3.125 mg 1 tablets twice daily  START Spirolactone 12.5 1/2 tablet daily   Your physician recommends that you schedule a follow-up appointment in:  Keep follow up with Dr. Shirlee Latch     Do the following things EVERYDAY: Weigh yourself in the morning before breakfast. Write it down and keep it in a log. Take your medicines as prescribed Eat low salt foods--Limit salt (sodium) to 2000 mg per day.  Stay as active as you can everyday Limit all fluids for the day to less than 2 liters  At the [facility], you and your health needs are our priority. As part of our continuing mission to provide you with exceptional heart care, we have created designated Provider Care Teams. These Care Teams include your primary Cardiologist (physician) and Advanced Practice Providers (APPs- Physician Assistants and Nurse Practitioners) who all work together to provide you with the care you need, when you need it.   You may see any of the following providers on your designated Care Team at your next follow up: Dr Arvilla Meres Dr Carron Curie, NP Robbie Lis, [place] [facility] [place], [place] Karle Plumber, PharmD   Please be sure to bring in all your medications bottles to every appointment.   If you have any questions or concerns before your next appointment please send [place] a message through [place] or call our office at [phone].    TO LEAVE A MESSAGE FOR THE NURSE SELECT OPTION 2, PLEASE LEAVE A MESSAGE INCLUDING: YOUR NAME DATE OF BIRTH CALL BACK NUMBER REASON FOR CALL**this is important as we prioritize the call backs  YOU WILL RECEIVE A  CALL BACK THE SAME DAY AS LONG AS YOU CALL BEFORE 4:00 PM

## 2022-03-29 ENCOUNTER — Ambulatory Visit: Payer: Medicare Other | Admitting: Cardiology

## 2022-04-01 DIAGNOSIS — I5022 Chronic systolic (congestive) heart failure: Secondary | ICD-10-CM | POA: Diagnosis not present

## 2022-04-04 ENCOUNTER — Encounter: Payer: Self-pay | Admitting: Cardiology

## 2022-04-04 ENCOUNTER — Ambulatory Visit: Payer: Medicare Other | Admitting: Cardiology

## 2022-04-04 ENCOUNTER — Ambulatory Visit (INDEPENDENT_AMBULATORY_CARE_PROVIDER_SITE_OTHER): Payer: Medicare Other

## 2022-04-04 VITALS — BP 126/74 | HR 67 | Ht 70.0 in | Wt 165.0 lb

## 2022-04-04 DIAGNOSIS — E785 Hyperlipidemia, unspecified: Secondary | ICD-10-CM

## 2022-04-04 DIAGNOSIS — I255 Ischemic cardiomyopathy: Secondary | ICD-10-CM | POA: Diagnosis not present

## 2022-04-04 DIAGNOSIS — Z951 Presence of aortocoronary bypass graft: Secondary | ICD-10-CM | POA: Diagnosis not present

## 2022-04-04 DIAGNOSIS — R55 Syncope and collapse: Secondary | ICD-10-CM

## 2022-04-04 HISTORY — DX: Ischemic cardiomyopathy: I25.5

## 2022-04-04 LAB — ECHOCARDIOGRAM COMPLETE
Area-P 1/2: 5.66 cm2
Height: 70 in
S' Lateral: 4.8 cm
Weight: 2640 oz

## 2022-04-04 MED ORDER — AMIODARONE HCL 200 MG PO TABS: ORAL_TABLET | ORAL | 3 refills | Status: DC

## 2022-04-04 NOTE — Progress Notes (Signed)
Cardiology Office Note:    Date:  04/04/2022   ID:  Roger Smith, Roger Smith [birth year], MRN [id number]  PCP:  Paulina Fusi, MD  Cardiologist:  Gypsy Balsam, MD    Referring MD: Paulina Fusi, MD   Chief Complaint  Patient presents with   Follow-up    History of Present Illness:    Roger Smith is a 81 y.o. male with complex past medical history which include coronary artery disease status post recent coronary bypass graft, ischemic cardiomyopathy with ejection fraction less than 20%.  I have I had an honor of taking care of his wife who required heart transplant and lived for more than 10 years after that.  Story is quite complicated: Had exertional CP and syncopal episode in church prompting evaluation by Cardiology in [place]. Echo at [facility] with EF 50% and inferior hypokinesis. Coronary calcium score 700. Subsequently had LHC which demonstrated 3 v CAD including LM disease. Echo at [facility] prior to CABG: EF < 20%, RV moderately reduced. He underwent CABG X 4 (LIMA to LAD, SVG to diagonal, left radial to OM, SVG to PDA) 01/23/22. Patient had expected POBLA and required transfusion. He did have atrial fibrillation and converted to NSR with IV amiodarone >> later switched to PO. PVCs also noted on tele. Diuresed with lasix and metolazone. Discharged on amiodarone 200 BID (plan to decrease 200 daily at f/u), Coreg 6.25 BID, Farxiga 10 mg daily, Furosemide 20 mg daily X 5 days then stop.    Seen in CT surgery 06/05 for suture removal. Became clammy and diaphoretic and had episode of decreased responsiveness. He was hypotensive with BP 79/54, improved to 113/64 on recheck. Coreg held for a day then given instructions to restart at lower dose 3.215 BID and stopped [place]. Diuretic discontinued. Amiodarone reduced to 200 mg daily. Had labs done, WBC elevated at 19K, Hgb 12.5, Scr 0.91, CO2 26, K 4.8, Na 137, glucose 107.  Started on abx for possible UTI.   Follow up 6/23 in [facility], had  syncopal episode, rapid response and code called. BP 90 on doppler, CBG 74. Given 1L NS and became more responsive. Evaluated by Dr. Shirlee Latch and transported to ED for further work up. Felt episode was secondary to hypotension and dehydration, however this was 2nd syncopal episode so Zio and LifeVest arranged.  Also found to have UTI and treated with Keflex. Echo showed EF 20-25%. Carvedilol and Imdur stopped during admission.  He is in my office today for follow-up and he looks good.  He said he is feeling much stronger much better he was able to walk on the regular basis with no difficulties.  He is annoyed with the LifeVest and he is actually asking me to remove the vest today.  Interestingly he did have Zio patch which showed multiple episode of ventricular tachycardia, he was on amiodarone however amiodarone has been discontinued for surgeon I guess that was probably related to the fact that his amiodarone being given to him initially secondary to atrial fibrillation however now with facing the situation with ventricular tachycardia, therefore, I will put him back on amiodarone.  He does have appointment with our EP team to talk about potentially ICD.  I will schedule him also today to have an echocardiogram done to assess left ventricle ejection fraction.  His ejection fraction is to be 20% but he feels so much better looks much stronger I suspect his ejection fraction improved.  He denies have any chest pain tightness squeezing pressure  burning chest no shortness of breath no swelling of lower extremities  Past Medical History:  Diagnosis Date   Chronic GERD    Coronary artery disease    Frequent PVCs    Hyperlipidemia    Myocardial infarction (HCC)    RBBB (right bundle branch block with left anterior fascicular block)    Syncope and collapse    TIA (transient ischemic attack)     Past Surgical History:  Procedure Laterality Date   CATARACT EXTRACTION Bilateral 2015   CORONARY ARTERY BYPASS  GRAFT N/A 01/23/2022   Procedure: CORONARY ARTERY BYPASS GRAFTING (CABG) X4 USING LEFT INTERNAL MAMMARY ARTERY, LEFT RADIAL ARTERY, AND RIGHT GREATER SAPHENOUS VEIN HARVESTED ENDOSCOPICALLY;  Surgeon: Lovett Sox, MD;  Location: [facility] OR;  Service: Open Heart Surgery;  Laterality: N/A;   INGUINAL HERNIA REPAIR Right 2005   INGUINAL HERNIA REPAIR Left 2017   Dr. Dimas Chyle   LEFT HEART CATH AND CORONARY ANGIOGRAPHY N/A 01/02/2022   Procedure: LEFT HEART CATH AND CORONARY ANGIOGRAPHY;  Surgeon: Kathleene Hazel, MD;  Location: [facility] INVASIVE CV LAB;  Service: Cardiovascular;  Laterality: N/A;   RADIAL ARTERY HARVEST Left 01/23/2022   Procedure: RADIAL ARTERY HARVEST;  Surgeon: Lovett Sox, MD;  Location: [facility] OR;  Service: Open Heart Surgery;  Laterality: Left;   TEE WITHOUT CARDIOVERSION N/A 01/23/2022   Procedure: TRANSESOPHAGEAL ECHOCARDIOGRAM (TEE);  Surgeon: Lovett Sox, MD;  Location: [facility] OR;  Service: Open Heart Surgery;  Laterality: N/A;    Current Medications: Current Meds  Medication Sig   acetaminophen (TYLENOL) 325 MG tablet Take 325-650 mg by mouth every 6 (six) hours as needed for mild pain or headache.   aspirin EC 325 MG tablet Take 1 tablet (325 mg total) by mouth daily.   carvedilol (COREG) 3.125 MG tablet Take 1 tablet (3.125 mg total) by mouth 2 (two) times daily.   famotidine (PEPCID) 40 MG tablet Take 40 mg by mouth daily before breakfast.   losartan (COZAAR) 25 MG tablet Take 0.5 tablets (12.5 mg total) by mouth at bedtime.   multivitamin-lutein (OCUVITE-LUTEIN) CAPS capsule Take 1 capsule by mouth 2 (two) times daily.   Omega-3 Fatty Acids (FISH OIL) 1200 MG CAPS Take 1,200 mg by mouth daily.   rosuvastatin (CRESTOR) 10 MG tablet Take 10 mg by mouth at bedtime.   spironolactone (ALDACTONE) 25 MG tablet Take 0.5 tablets (12.5 mg total) by mouth daily.   [DISCONTINUED] amiodarone (PACERONE) 200 MG tablet Take 200 mg by mouth in the morning.     Allergies:   Patient  has no known allergies.   Social History   Socioeconomic History   Marital status: Widowed    Spouse name: Not on file   Number of children: Not on file   Years of education: Not on file   Highest education level: Not on file  Occupational History   Not on file  Tobacco Use   Smoking status: Never   Smokeless tobacco: Never  Vaping Use   Vaping Use: Never used  Substance and Sexual Activity   Alcohol use: Never   Drug use: Never   Sexual activity: Not on file  Other Topics Concern   Not on file  Social History Narrative   Not on file   Social Determinants of Health   Financial Resource Strain: Not on file  Food Insecurity: Not on file  Transportation Needs: No Transportation Needs (02/09/2022)   PRAPARE - Transportation    Lack of Transportation (Medical): No    Lack  of Transportation (Non-Medical): No  Physical Activity: Not on file  Stress: Not on file  Social Connections: Unknown (02/09/2022)   Social Connection and Isolation Panel [NHANES]    Frequency of Communication with Friends and Family: Twice a week    Frequency of Social Gatherings with Friends and Family: Twice a week    Attends Religious Services: Not on Marketing executive or Organizations: Yes    Attends Banker Meetings: 1 to 4 times per year    Marital Status: Married     Family History: The patient's family history includes Heart attack in his mother; Heart disease in his mother; Stomach cancer in his maternal grandfather. ROS:   Please see the history of present illness.    All 14 point review of systems negative except as described per history of present illness  EKGs/Labs/Other Studies Reviewed:      Recent Labs: 01/24/2022: ALT 13 02/06/2022: Hemoglobin 11.3; Magnesium 2.4; Platelets 381 03/25/2022: BUN 15; Creatinine, Ser 0.86; Potassium 4.5; Sodium 138  Recent Lipid Panel    Component Value Date/Time   CHOL 64 01/25/2022 0439   TRIG 27 01/25/2022 0439   HDL 34  (L) 01/25/2022 0439   CHOLHDL 1.9 01/25/2022 0439   VLDL 5 01/25/2022 0439   LDLCALC 25 01/25/2022 0439    Physical Exam:    VS:  BP 126/74 (BP Location: Right Arm, Patient Position: Sitting)   Pulse 67   Ht 5\' 10"  (1.778 m)   Wt 165 lb (74.8 kg)   SpO2 96%   BMI 23.68 kg/m     Wt Readings from Last 3 Encounters:  04/04/22 165 lb (74.8 kg)  03/25/22 162 lb 6.4 oz (73.7 kg)  02/26/22 162 lb 3.2 oz (73.6 kg)     GEN:  Well nourished, well developed in no acute distress HEENT: Normal NECK: No JVD; No carotid bruits LYMPHATICS: No lymphadenopathy CARDIAC: RRR, no murmurs, no rubs, no gallops RESPIRATORY:  Clear to auscultation without rales, wheezing or rhonchi  ABDOMEN: Soft, non-tender, non-distended MUSCULOSKELETAL:  No edema; No deformity  SKIN: Warm and dry LOWER EXTREMITIES: no swelling NEUROLOGIC:  Alert and oriented x 3 PSYCHIATRIC:  Normal affect   ASSESSMENT:    1. S/P CABG x 4   2. Syncope and collapse   3. Dyslipidemia   4. Ischemic cardiomyopathy ejection fraction 20%    PLAN:    In order of problems listed above:  Status post coronary bypass graft wound healed completely.  He is on antiplatelet therapy which I will continue. Severe ischemic cardiomyopathy with ejection fraction 20%, however, before bypass surgery his ejection fraction was 50%.  He had difficulty tolerating medication because of low blood pressure.  Today blood pressure is quite reasonable but when I review his recordings from home usually blood pressure systolic is fluctuating around 100 mmHg or lower.  Therefore, he is only on a very small dose of Coreg 3.125 twice daily as well as small dose of Cozaar only 0.5 mg daily at bedtime.  Aldactone 12.5 mg daily.  Again I will ask him to have echocardiogram done today or within next few days I anticipate improvement in left ventricular ejection fraction, also anticipate to be able to increase dosages of the medication that he is taking as  tolerated. Dyslipidemia he is taking only 10 mg of Crestor.  His last K PN show me his LDL of 25 HDL 34 this is from 01/25/2022.  We will continue that  management for now. LifeVest present, I did review interrogation of this device there is no therapy delivered.  He absolutely hates the device and he would like to have it removed however with frequent ventricle tachycardia I prefer to keep him wearing it he may require implantable ICD.  Will reassess left ventricle ejection fraction he will be referred for EP evaluation of   Medication Adjustments/Labs and Tests Ordered: Current medicines are reviewed at length with the patient today.  Concerns regarding medicines are outlined above.  No orders of the defined types were placed in this encounter.  Medication changes: No orders of the defined types were placed in this encounter.   Signed, Georgeanna Lea, MD, [facility] 04/04/2022 10:30 AM    [facility]

## 2022-04-04 NOTE — Patient Instructions (Addendum)
Medication Instructions:  Your physician has recommended you make the following change in your medication:   START: Amiodarone 200mg  1 twice daily by mouth twice daily then 1 daily by mouth     Lab Work: BMP, Magnesium today If you have labs (blood work) drawn today and your tests are completely normal, you will receive your results only by: MyChart Message (if you have MyChart) OR A paper copy in the mail If you have any lab test that is abnormal or we need to change your treatment, we will call you to review the results.   Testing/Procedures: Your physician has requested that you have an echocardiogram. Echocardiography is a painless test that uses sound waves to create images of your heart. It provides your doctor with information about the size and shape of your heart and how well your heart's chambers and valves are working. This procedure takes approximately one hour. There are no restrictions for this procedure.    Follow-Up: At [facility], you and your health needs are our priority.  As part of our continuing mission to provide you with exceptional heart care, we have created designated Provider Care Teams.  These Care Teams include your primary Cardiologist (physician) and Advanced Practice Providers (APPs -  Physician Assistants and Nurse Practitioners) who all work together to provide you with the care you need, when you need it.  We recommend signing up for the patient portal called "MyChart".  Sign up information is provided on this After Visit Summary.  MyChart is used to connect with patients for Virtual Visits (Telemedicine).  Patients are able to view lab/test results, encounter notes, upcoming appointments, etc.  Non-urgent messages can be sent to your provider as well.   To learn more about what you can do with MyChart, go to [facility].    Your next appointment:   6 week(s)  The format for your next appointment:   In Person  Provider:   [email or web address], MD    Other Instructions NA

## 2022-04-05 LAB — BASIC METABOLIC PANEL
BUN/Creatinine Ratio: 22 (ref 10–24)
BUN: 20 mg/dL (ref 8–27)
CO2: 23 mmol/L (ref 20–29)
Calcium: 10.6 mg/dL — ABNORMAL HIGH (ref 8.6–10.2)
Chloride: 105 mmol/L (ref 96–106)
Creatinine, Ser: 0.89 mg/dL (ref 0.76–1.27)
Glucose: 91 mg/dL (ref 70–99)
Potassium: 5 mmol/L (ref 3.5–5.2)
Sodium: 141 mmol/L (ref 134–144)
eGFR: 87 mL/min/{1.73_m2} (ref >59)

## 2022-04-05 LAB — MAGNESIUM: Magnesium: 2.4 mg/dL — ABNORMAL HIGH (ref 1.6–2.3)

## 2022-04-09 ENCOUNTER — Encounter: Payer: Self-pay | Admitting: Cardiology

## 2022-04-09 ENCOUNTER — Telehealth: Payer: Self-pay

## 2022-04-09 ENCOUNTER — Ambulatory Visit: Payer: Medicare Other | Admitting: Cardiology

## 2022-04-09 VITALS — BP 132/80 | HR 67 | Ht 70.0 in | Wt 165.2 lb

## 2022-04-09 DIAGNOSIS — I255 Ischemic cardiomyopathy: Secondary | ICD-10-CM

## 2022-04-09 DIAGNOSIS — I5022 Chronic systolic (congestive) heart failure: Secondary | ICD-10-CM

## 2022-04-09 NOTE — Patient Instructions (Addendum)
Medication Instructions:  Your physician recommends that you continue on your current medications as directed. Please refer to the Current Medication list given to you today.  *If you need a refill on your cardiac medications before your next appointment, please call your pharmacy*  Lab Work: None ordered.  If you have labs (blood work) drawn today and your tests are completely normal, you will receive your results only by: MyChart Message (if you have MyChart) OR A paper copy in the mail If you have any lab test that is abnormal or we need to change your treatment, we will call you to review the results.  Testing/Procedures: None ordered.  Follow-Up:  Your physician wants you to follow-up in: based on the results of your echocardiogram.    Important Information About Sugar

## 2022-04-09 NOTE — Progress Notes (Signed)
Electrophysiology Office Note   Date:  04/09/2022   ID:  Roger Smith, Roger Smith [birth year], MRN [id number]  PCP:  Paulina Fusi, MD  Cardiologist:  Johnanna Schneiders Primary Electrophysiologist:  Regan Lemming, MD    Chief Complaint: CHF   History of Present Illness: Roger Smith is a 81 y.o. male who is being seen today for the evaluation of CHF at the request of Georgeanna Lea, MD. Presenting today for electrophysiology evaluation.  He has a history seen for hypertension, hyperlipidemia, right bundle branch block, TIA.  He had exertional chest pain.  Left heart catheterization showed three-vessel coronary artery disease.  He is status post CABG x 4 on 01/23/2022.  He had atrial fibrillation postop and converted to sinus rhythm with amiodarone.    June 2023 he had a syncopal episode.  His blood pressure was quite low.  He was transferred to the emergency room.  He had a second syncopal episode and thus a LifeVest was placed.  He was found to have an ejection fraction of 20 to 25%.  Today, he denies symptoms of palpitations, chest pain, shortness of breath, orthopnea, PND, lower extremity edema, claudication, dizziness, presyncope, syncope, bleeding, or neurologic sequela. The patient is tolerating medications without difficulties.  Over the past few weeks he has done well.  He has been walking quite a bit, an hour or 2 a day around his daughter's house.  He was told not to get sweaty and thus he has been walking inside.  He has not had any further issues.     Past Medical History:  Diagnosis Date   Chronic GERD    Coronary artery disease    Frequent PVCs    Hyperlipidemia    Myocardial infarction (HCC)    RBBB (right bundle branch block with left anterior fascicular block)    Syncope and collapse    TIA (transient ischemic attack)    Past Surgical History:  Procedure Laterality Date   CATARACT EXTRACTION Bilateral 2015   CORONARY ARTERY BYPASS GRAFT N/A 01/23/2022    Procedure: CORONARY ARTERY BYPASS GRAFTING (CABG) X4 USING LEFT INTERNAL MAMMARY ARTERY, LEFT RADIAL ARTERY, AND RIGHT GREATER SAPHENOUS VEIN HARVESTED ENDOSCOPICALLY;  Surgeon: Lovett Sox, MD;  Location: [facility] OR;  Service: Open Heart Surgery;  Laterality: N/A;   INGUINAL HERNIA REPAIR Right 2005   INGUINAL HERNIA REPAIR Left 2017   Dr. Dimas Chyle   LEFT HEART CATH AND CORONARY ANGIOGRAPHY N/A 01/02/2022   Procedure: LEFT HEART CATH AND CORONARY ANGIOGRAPHY;  Surgeon: Kathleene Hazel, MD;  Location: [facility] INVASIVE CV LAB;  Service: Cardiovascular;  Laterality: N/A;   RADIAL ARTERY HARVEST Left 01/23/2022   Procedure: RADIAL ARTERY HARVEST;  Surgeon: Lovett Sox, MD;  Location: [facility] OR;  Service: Open Heart Surgery;  Laterality: Left;   TEE WITHOUT CARDIOVERSION N/A 01/23/2022   Procedure: TRANSESOPHAGEAL ECHOCARDIOGRAM (TEE);  Surgeon: Lovett Sox, MD;  Location: [facility] OR;  Service: Open Heart Surgery;  Laterality: N/A;     Current Outpatient Medications  Medication Sig Dispense Refill   acetaminophen (TYLENOL) 325 MG tablet Take 325-650 mg by mouth every 6 (six) hours as needed for mild pain or headache.     amiodarone (PACERONE) 200 MG tablet 200mg  BID x 1 week then 200mg  daily 90 tablet 3   aspirin EC 325 MG tablet Take 1 tablet (325 mg total) by mouth daily.     carvedilol (COREG) 3.125 MG tablet Take 1 tablet (3.125 mg total) by mouth 2 (two) times daily.  180 tablet 1   famotidine (PEPCID) 40 MG tablet Take 40 mg by mouth daily before breakfast.     losartan (COZAAR) 25 MG tablet Take 0.5 tablets (12.5 mg total) by mouth at bedtime. 45 tablet 3   multivitamin-lutein (OCUVITE-LUTEIN) CAPS capsule Take 1 capsule by mouth 2 (two) times daily.     Omega-3 Fatty Acids (FISH OIL) 1200 MG CAPS Take 1,200 mg by mouth daily.     rosuvastatin (CRESTOR) 10 MG tablet Take 10 mg by mouth at bedtime.     spironolactone (ALDACTONE) 25 MG tablet Take 0.5 tablets (12.5 mg total) by mouth daily. 90  tablet 0   No current facility-administered medications for this visit.    Allergies:   Patient has no known allergies.   Social History:  The patient  reports that he has never smoked. He has never used smokeless tobacco. He reports that he does not drink alcohol and does not use drugs.   Family History:  The patient's family history includes Heart attack in his mother; Heart disease in his mother; Stomach cancer in his maternal grandfather.    ROS:  Please see the history of present illness.   Otherwise, review of systems is positive for none.   All other systems are reviewed and negative.    PHYSICAL EXAM: VS:  BP 132/80   Pulse 67   Ht 5\' 10"  (1.778 m)   Wt 165 lb 3.2 oz (74.9 kg)   SpO2 93%   BMI 23.70 kg/m  , BMI Body mass index is 23.7 kg/m. GEN: Well nourished, well developed, in no acute distress  HEENT: normal  Neck: no JVD, carotid bruits, or masses Cardiac: RRR; no murmurs, rubs, or gallops,no edema  Respiratory:  clear to auscultation bilaterally, normal work of breathing GI: soft, nontender, nondistended, + BS MS: no deformity or atrophy  Skin: warm and dry Neuro:  Strength and sensation are intact Psych: euthymic mood, full affect  EKG:  EKG is not ordered today. Personal review of the ekg ordered 03/25/22 shows sinus rhythm, right bundle branch block, left anterior fascicular block, rate 73   Recent Labs: 01/24/2022: ALT 13 02/06/2022: Hemoglobin 11.3; Platelets 381 04/04/2022: BUN 20; Creatinine, Ser 0.89; Magnesium 2.4; Potassium 5.0; Sodium 141    Lipid Panel     Component Value Date/Time   CHOL 64 01/25/2022 0439   TRIG 27 01/25/2022 0439   HDL 34 (L) 01/25/2022 0439   CHOLHDL 1.9 01/25/2022 0439   VLDL 5 01/25/2022 0439   LDLCALC 25 01/25/2022 0439     Wt Readings from Last 3 Encounters:  04/09/22 165 lb 3.2 oz (74.9 kg)  04/04/22 165 lb (74.8 kg)  03/25/22 162 lb 6.4 oz (73.7 kg)      Other studies Reviewed: Additional studies/ records  that were reviewed today include: TTE 04/04/22  Review of the above records today demonstrates:   1. GLS -14.8. Left ventricular ejection fraction, by estimation, is 25 to  30%. The left ventricle has severely decreased function. The left  ventricle has no regional wall motion abnormalities. Left ventricular  diastolic parameters are consistent with  Grade I diastolic dysfunction (impaired relaxation).   2. Right ventricular systolic function is normal. The right ventricular  size is normal. There is normal pulmonary artery systolic pressure.   3. The mitral valve is normal in structure. Mild mitral valve  regurgitation. No evidence of mitral stenosis.   4. The aortic valve is normal in structure. Aortic valve regurgitation is  trivial. Aortic valve sclerosis is present, with no evidence of aortic  valve stenosis.   5. The inferior vena cava is normal in size with greater than 50%  respiratory variability, suggesting right atrial pressure of 3 mmHg.   LHC 01/02/22   Prox RCA lesion is 99% stenosed.   Mid RCA lesion is 40% stenosed.   Ost Cx to Prox Cx lesion is 99% stenosed.   Mid LM to Dist LM lesion is 50% stenosed.   2nd Diag lesion is 60% stenosed.   Mid LAD lesion is 100% stenosed.   Ost LAD to Prox LAD lesion is 70% stenosed.  ASSESSMENT AND PLAN:  1.  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  Currently on carvedilol 3.125 mg twice daily, Aldactone 12.5 mg daily, losartan 12.5 mg daily.  Unfortunately repeat echo shows a persistently low ejection fraction.  He would benefit from ICD implant.  Risk and benefits were discussed risk of bleeding, tamponade, infection, pneumothorax, lead dislodgment.  In the chart, and states that he Temprance Wyre get another transthoracic echo.  I Niaomi Cartaya talk to his cardiologist as to whether or not he needs a repeat echo or whether or not we can simply implant an ICD.  2.  Coronary artery disease: Status post CABG x 4.  Continue aspirin and  statin per primary cardiology.  3.  Atrial fibrillation: Occurred postoperatively.  Sinus rhythm today.  Continue current management.  4.  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  Continue with current management.   Current medicines are reviewed at length with the patient today.   The patient does not have concerns regarding his medicines.  The following changes were made today:  none  Labs/ tests ordered today include:  No orders of the defined types were placed in this encounter.    Disposition:   FU with Yonael Tulloch 3 months  Signed, Rolanda Campa Jorja Loa, MD  04/09/2022 8:32 AM     [facility] [address] [place] [address] [phone] (office) [phone] (fax)

## 2022-04-09 NOTE — Telephone Encounter (Signed)
Per further discussion between Dr. Elberta Fortis and Dr. Johnanna Schneiders it has been decided Pt does not need a repeat echocardiogram and advisement is to proceed with ICD implant.  Outreach  made to Pt's daughter per family request.  Pt scheduled for ICD implant 05/24/2022 at 4:30 pm.  Will get lab work at upcoming appointment with Dr. Kirtland Bouchard on 05/17/2022.  Pt will also pick up surgical scrub at this appointment.  Letter has been completed and mailed to updated address: [address] [place], [place] [address]  Work up complete.

## 2022-04-11 DIAGNOSIS — I420 Dilated cardiomyopathy: Secondary | ICD-10-CM | POA: Diagnosis not present

## 2022-04-11 DIAGNOSIS — I252 Old myocardial infarction: Secondary | ICD-10-CM | POA: Diagnosis not present

## 2022-04-15 ENCOUNTER — Other Ambulatory Visit (HOSPITAL_COMMUNITY): Payer: Self-pay | Admitting: Cardiology

## 2022-04-19 ENCOUNTER — Telehealth: Payer: Self-pay

## 2022-04-19 NOTE — Telephone Encounter (Signed)
Sent pt message by my chart per DPR and per Dr. Vanetta Shawl note. Pt had aready seen results. Encouraged to call with any questions. Routed to PCP.

## 2022-04-25 ENCOUNTER — Encounter: Payer: Self-pay | Admitting: Cardiology

## 2022-04-26 NOTE — Telephone Encounter (Signed)
Aware we are extending LifeVest until procedure. verbalized understanding and agreeable to plan.

## 2022-05-01 ENCOUNTER — Telehealth: Payer: Self-pay

## 2022-05-01 NOTE — Telephone Encounter (Signed)
ZOLL LifeVest Treatment Plan form completed by [facility] and faxed on 04/26/22. Confirmation received.

## 2022-05-12 DIAGNOSIS — I252 Old myocardial infarction: Secondary | ICD-10-CM | POA: Diagnosis not present

## 2022-05-12 DIAGNOSIS — I420 Dilated cardiomyopathy: Secondary | ICD-10-CM | POA: Diagnosis not present

## 2022-05-17 ENCOUNTER — Encounter: Payer: Self-pay | Admitting: Cardiology

## 2022-05-17 ENCOUNTER — Other Ambulatory Visit: Payer: Self-pay

## 2022-05-17 ENCOUNTER — Ambulatory Visit: Payer: Medicare Other | Attending: Cardiology | Admitting: Cardiology

## 2022-05-17 ENCOUNTER — Telehealth: Payer: Self-pay | Admitting: *Deleted

## 2022-05-17 VITALS — BP 156/88 | HR 65 | Ht 69.0 in | Wt 167.8 lb

## 2022-05-17 DIAGNOSIS — I493 Ventricular premature depolarization: Secondary | ICD-10-CM | POA: Diagnosis not present

## 2022-05-17 DIAGNOSIS — E785 Hyperlipidemia, unspecified: Secondary | ICD-10-CM | POA: Diagnosis not present

## 2022-05-17 DIAGNOSIS — I10 Essential (primary) hypertension: Secondary | ICD-10-CM

## 2022-05-17 DIAGNOSIS — R55 Syncope and collapse: Secondary | ICD-10-CM

## 2022-05-17 DIAGNOSIS — Z951 Presence of aortocoronary bypass graft: Secondary | ICD-10-CM

## 2022-05-17 NOTE — Telephone Encounter (Signed)
Spoke to dtr. Advised time to arrive for procedure next week, 9/22, is now 11:30 am. Aware all other instructions remain the same. Dtr verbalized understanding and agreeable to plan.

## 2022-05-17 NOTE — Patient Instructions (Signed)
Medication Instructions:  Your physician recommends that you continue on your current medications as directed. Please refer to the Current Medication list given to you today.  *If you need a refill on your cardiac medications before your next appointment, please call your pharmacy*   Lab Work: Your physician recommends that you return for lab work in:   Labs today: BMP, CBC  If you have labs (blood work) drawn today and your tests are completely normal, you will receive your results only by: MyChart Message (if you have MyChart) OR A paper copy in the mail If you have any lab test that is abnormal or we need to change your treatment, we will call you to review the results.   Testing/Procedures: None   Follow-Up: At [facility], you and your health needs are our priority.  As part of our continuing mission to provide you with exceptional heart care, we have created designated Provider Care Teams.  These Care Teams include your primary Cardiologist (physician) and Advanced Practice Providers (APPs -  Physician Assistants and Nurse Practitioners) who all work together to provide you with the care you need, when you need it.  We recommend signing up for the patient portal called "MyChart".  Sign up information is provided on this After Visit Summary.  MyChart is used to connect with patients for Virtual Visits (Telemedicine).  Patients are able to view lab/test results, encounter notes, upcoming appointments, etc.  Non-urgent messages can be sent to your provider as well.   To learn more about what you can do with MyChart, go to [email or web address].    Your next appointment:   2 month(s)  The format for your next appointment:   In Person  Provider:   Dr. Gypsy Balsam, MD   Other Instructions None  Important Information About Sugar

## 2022-05-17 NOTE — Progress Notes (Unsigned)
Cardiology Office Note:    Date:  05/17/2022   ID:  Roger Smith 05-18-1941, MRN [id number]  PCP:  Paulina Fusi, MD  Cardiologist:  Gypsy Balsam, MD    Referring MD: Paulina Fusi, MD   No chief complaint on file.   History of Present Illness:    Roger Smith is a 81 y.o. male  with complex past medical history which include coronary artery disease status post recent coronary bypass graft, ischemic cardiomyopathy with ejection fraction less than 20%.  I have I had an honor of taking care of his wife who required heart transplant and lived for more than 10 years after that.  Story is quite complicated: Had exertional CP and syncopal episode in church prompting evaluation by Cardiology in [place]. Echo at [facility] with EF 50% and inferior hypokinesis. Coronary calcium score 700. Subsequently had LHC which demonstrated 3 v CAD including LM disease. Echo [facility] prior to CABG: EF < 20%, RV moderately reduced. He underwent CABG X 4 (LIMA to LAD, SVG to diagonal, left radial to OM, SVG to PDA) 01/23/22. Patient had expected POBLA and required transfusion. He did have atrial fibrillation and converted to NSR with IV amiodarone >> later switched to PO. PVCs also noted on tele. Diuresed with lasix and metolazone. Discharged on amiodarone 200 BID (plan to decrease 200 daily at f/u), Coreg 6.25 BID, Farxiga 10 mg daily, Furosemide 20 mg daily X 5 days then stop.    Seen in CT surgery 06/05 for suture removal. Became clammy and diaphoretic and had episode of decreased responsiveness. He was hypotensive with BP 79/54, improved to 113/64 on recheck. Coreg held for a day then given instructions to restart at lower dose 3.215 BID and stopped [place]. Diuretic discontinued. Amiodarone reduced to 200 mg daily. Had labs done, WBC elevated at 19K, Hgb 12.5, Scr 0.91, CO2 26, K 4.8, Na 137, glucose 107.  Started on abx for possible UTI.   Follow up 6/23 in [facility], had syncopal episode, rapid  response and code called. BP 90 on doppler, CBG 74. Given 1L NS and became more responsive. Evaluated by Dr. Shirlee Latch and transported to ED for further work up. Felt episode was secondary to hypotension and dehydration, however this was 2nd syncopal episode so Zio and LifeVest arranged.  Also found to have UTI and treated with Keflex. Echo showed EF 20-25%. Carvedilol and Imdur stopped during admission.  He is coming today to my office for follow-up.  Overall he is doing well.  He denies have any chest pain tightness squeezing pressure burning chest.  He is scheduled to have ICD implant done by Dr. Elberta Fortis.  He is looking forward to it.  Past Medical History:  Diagnosis Date   Chronic GERD    Coronary artery disease    Frequent PVCs    Hyperlipidemia    Myocardial infarction (HCC)    RBBB (right bundle branch block with left anterior fascicular block)    Syncope and collapse    TIA (transient ischemic attack)     Past Surgical History:  Procedure Laterality Date   CATARACT EXTRACTION Bilateral 2015   CORONARY ARTERY BYPASS GRAFT N/A 01/23/2022   Procedure: CORONARY ARTERY BYPASS GRAFTING (CABG) X4 USING LEFT INTERNAL MAMMARY ARTERY, LEFT RADIAL ARTERY, AND RIGHT GREATER SAPHENOUS VEIN HARVESTED ENDOSCOPICALLY;  Surgeon: Lovett Sox, MD;  Location: [facility] OR;  Service: Open Heart Surgery;  Laterality: N/A;   INGUINAL HERNIA REPAIR Right 2005   INGUINAL HERNIA REPAIR Left 2017  Dr. Dimas Chyle   LEFT HEART CATH AND CORONARY ANGIOGRAPHY N/A 01/02/2022   Procedure: LEFT HEART CATH AND CORONARY ANGIOGRAPHY;  Surgeon: Kathleene Hazel, MD;  Location: [facility] INVASIVE CV LAB;  Service: Cardiovascular;  Laterality: N/A;   RADIAL ARTERY HARVEST Left 01/23/2022   Procedure: RADIAL ARTERY HARVEST;  Surgeon: Lovett Sox, MD;  Location: [facility] OR;  Service: Open Heart Surgery;  Laterality: Left;   TEE WITHOUT CARDIOVERSION N/A 01/23/2022   Procedure: TRANSESOPHAGEAL ECHOCARDIOGRAM (TEE);  Surgeon: Lovett Sox, MD;  Location: [facility] OR;  Service: Open Heart Surgery;  Laterality: N/A;    Current Medications: Current Meds  Medication Sig   acetaminophen (TYLENOL) 325 MG tablet Take 325-650 mg by mouth every 6 (six) hours as needed for mild pain or headache.   amiodarone (PACERONE) 200 MG tablet 200mg  BID x 1 week then 200mg  daily   aspirin EC 325 MG tablet Take 1 tablet (325 mg total) by mouth daily.   carvedilol (COREG) 3.125 MG tablet Take 1 tablet (3.125 mg total) by mouth 2 (two) times daily.   famotidine (PEPCID) 40 MG tablet Take 40 mg by mouth daily before breakfast.   losartan (COZAAR) 25 MG tablet Take 0.5 tablets (12.5 mg total) by mouth at bedtime.   multivitamin-lutein (OCUVITE-LUTEIN) CAPS capsule Take 1 capsule by mouth 2 (two) times daily.   Omega-3 Fatty Acids (FISH OIL) 1200 MG CAPS Take 1,200 mg by mouth daily.   rosuvastatin (CRESTOR) 10 MG tablet Take 10 mg by mouth at bedtime.   spironolactone (ALDACTONE) 25 MG tablet Take 0.5 tablets (12.5 mg total) by mouth daily.     Allergies:   Patient has no known allergies.   Social History   Socioeconomic History   Marital status: Widowed    Spouse name: Not on file   Number of children: Not on file   Years of education: Not on file   Highest education level: Not on file  Occupational History   Not on file  Tobacco Use   Smoking status: Never   Smokeless tobacco: Never  Vaping Use   Vaping Use: Never used  Substance and Sexual Activity   Alcohol use: Never   Drug use: Never   Sexual activity: Not on file  Other Topics Concern   Not on file  Social History Narrative   Not on file   Social Determinants of Health   Financial Resource Strain: Not on file  Food Insecurity: Not on file  Transportation Needs: No Transportation Needs (02/09/2022)   PRAPARE - (Medical): No    Lack of Transportation (Non-Medical): No  Physical Activity: Not on file  Stress: Not on file  Social  Connections: Unknown (02/09/2022)   Social Connection and Isolation Panel [NHANES]    Frequency of Communication with Friends and Family: Twice a week    Frequency of Social Gatherings with Friends and Family: Twice a week    Attends Religious Services: Not on Administrator, Civil Service or Organizations: Yes    Attends 04/11/2022 Meetings: 1 to 4 times per year    Marital Status: Married     Family History: The patient's family history includes Heart attack in his mother; Heart disease in his mother; Stomach cancer in his maternal grandfather. ROS:   Please see the history of present illness.    All 14 point review of systems negative except as described per history of present illness  EKGs/Labs/Other Studies  Reviewed:      Recent Labs: 01/24/2022: ALT 13 02/06/2022: Hemoglobin 11.3; Platelets 381 04/04/2022: BUN 20; Creatinine, Ser 0.89; Magnesium 2.4; Potassium 5.0; Sodium 141  Recent Lipid Panel    Component Value Date/Time   CHOL 64 01/25/2022 0439   TRIG 27 01/25/2022 0439   HDL 34 (L) 01/25/2022 0439   CHOLHDL 1.9 01/25/2022 0439   VLDL 5 01/25/2022 0439   LDLCALC 25 01/25/2022 0439    Physical Exam:    VS:  BP (!) 156/88   Pulse 65   Ht 5\' 9"  (1.753 m)   Wt 167 lb 12.8 oz (76.1 kg)   SpO2 95%   BMI 24.78 kg/m     Wt Readings from Last 3 Encounters:  05/17/22 167 lb 12.8 oz (76.1 kg)  04/09/22 165 lb 3.2 oz (74.9 kg)  04/04/22 165 lb (74.8 kg)     GEN:  Well nourished, well developed in no acute distress HEENT: Normal NECK: No JVD; No carotid bruits LYMPHATICS: No lymphadenopathy CARDIAC: RRR, no murmurs, no rubs, no gallops RESPIRATORY:  Clear to auscultation without rales, wheezing or rhonchi  ABDOMEN: Soft, non-tender, non-distended MUSCULOSKELETAL:  No edema; No deformity  SKIN: Warm and dry LOWER EXTREMITIES: no swelling NEUROLOGIC:  Alert and oriented x 3 PSYCHIATRIC:  Normal affect   ASSESSMENT:    1. S/P CABG x 4   2.  Dyslipidemia   3. Ventricular ectopy   4. Essential hypertension   5. Syncope, unspecified syncope type    PLAN:    In order of problems listed above:  Status post coronary bypass graft doing well from that point review denies have any symptoms, no chest pain tightness squeezing pressure burning chest. Dyslipidemia he is on Crestor 10.  I did review K PN which show me his LDL 25 HDL 34.  We will continue present management. Frequent ventricular ectopy.  He does have LifeVest.  He is scheduled to have ICD implant Primary hypertension: Blood pressure elevated today but he brought blood pressure measurements from home and does always good.  We will continue present management. Cardiomyopathy he is on maximal tolerated medications.  Gradually try to increase dosages of this medications however he does not want to do anything before implantation of ICD.  Therefore, I will see him back in about 2 to 3 months and make adjustment to his medications   Medication Adjustments/Labs and Tests Ordered: Current medicines are reviewed at length with the patient today.  Concerns regarding medicines are outlined above.  No orders of the defined types were placed in this encounter.  Medication changes: No orders of the defined types were placed in this encounter.   Signed, 06/04/22, MD, [facility] 05/17/2022 11:08 AM    [facility]

## 2022-05-18 LAB — CBC
Hematocrit: 43.8 % (ref 37.5–51.0)
Hemoglobin: 14.2 g/dL (ref 13.0–17.7)
MCH: 32 pg (ref 26.6–33.0)
MCHC: 32.4 g/dL (ref 31.5–35.7)
MCV: 99 fL — ABNORMAL HIGH (ref 79–97)
Platelets: 203 10*3/uL (ref 150–450)
RBC: 4.44 x10E6/uL (ref 4.14–5.80)
RDW: 12.8 % (ref 11.6–15.4)
WBC: 8.4 10*3/uL (ref 3.4–10.8)

## 2022-05-18 LAB — BASIC METABOLIC PANEL
BUN/Creatinine Ratio: 16 (ref 10–24)
BUN: 16 mg/dL (ref 8–27)
CO2: 25 mmol/L (ref 20–29)
Calcium: 10.5 mg/dL — ABNORMAL HIGH (ref 8.6–10.2)
Chloride: 101 mmol/L (ref 96–106)
Creatinine, Ser: 1.01 mg/dL (ref 0.76–1.27)
Glucose: 84 mg/dL (ref 70–99)
Potassium: 5.5 mmol/L — ABNORMAL HIGH (ref 3.5–5.2)
Sodium: 139 mmol/L (ref 134–144)
eGFR: 76 mL/min/{1.73_m2} (ref >59)

## 2022-05-23 NOTE — Pre-Procedure Instructions (Signed)
Spoke with Kieth Brightly, patient's daughter, he lives with here.   Instructed patient on the following items: Arrival time 1100 Nothing to eat or drink after midnight No meds AM of procedure Responsible person to drive you home and stay with you for 24 hrs  No Aspirin on 9/22

## 2022-05-24 ENCOUNTER — Telehealth: Payer: Self-pay

## 2022-05-24 ENCOUNTER — Ambulatory Visit (HOSPITAL_COMMUNITY)
Admission: RE | Admit: 2022-05-24 | Discharge: 2022-05-24 | Disposition: A | Discharge status: Home / Self Care | Payer: Medicare Other | Attending: Cardiology | Admitting: Cardiology

## 2022-05-24 ENCOUNTER — Other Ambulatory Visit: Payer: Self-pay

## 2022-05-24 ENCOUNTER — Ambulatory Visit (HOSPITAL_COMMUNITY): Payer: Medicare Other

## 2022-05-24 ENCOUNTER — Encounter (HOSPITAL_COMMUNITY)
Admission: RE | Disposition: A | Discharge status: Home / Self Care | Payer: Self-pay | Source: Home / Self Care | Attending: Cardiology

## 2022-05-24 DIAGNOSIS — E785 Hyperlipidemia, unspecified: Secondary | ICD-10-CM | POA: Insufficient documentation

## 2022-05-24 DIAGNOSIS — Z951 Presence of aortocoronary bypass graft: Secondary | ICD-10-CM | POA: Diagnosis not present

## 2022-05-24 DIAGNOSIS — I2510 Atherosclerotic heart disease of native coronary artery without angina pectoris: Secondary | ICD-10-CM | POA: Insufficient documentation

## 2022-05-24 DIAGNOSIS — I255 Ischemic cardiomyopathy: Secondary | ICD-10-CM | POA: Insufficient documentation

## 2022-05-24 DIAGNOSIS — Z95810 Presence of automatic (implantable) cardiac defibrillator: Secondary | ICD-10-CM | POA: Diagnosis not present

## 2022-05-24 DIAGNOSIS — I4510 Unspecified right bundle-branch block: Secondary | ICD-10-CM | POA: Insufficient documentation

## 2022-05-24 DIAGNOSIS — I4891 Unspecified atrial fibrillation: Secondary | ICD-10-CM | POA: Diagnosis not present

## 2022-05-24 DIAGNOSIS — I110 Hypertensive heart disease with heart failure: Secondary | ICD-10-CM | POA: Insufficient documentation

## 2022-05-24 DIAGNOSIS — I5022 Chronic systolic (congestive) heart failure: Secondary | ICD-10-CM | POA: Insufficient documentation

## 2022-05-24 DIAGNOSIS — Z8673 Personal history of transient ischemic attack (TIA), and cerebral infarction without residual deficits: Secondary | ICD-10-CM | POA: Diagnosis not present

## 2022-05-24 DIAGNOSIS — I700 Atherosclerosis of aorta: Secondary | ICD-10-CM | POA: Diagnosis not present

## 2022-05-24 HISTORY — PX: ICD IMPLANT: EP1208

## 2022-05-24 SURGERY — ICD IMPLANT

## 2022-05-24 MED ORDER — LIDOCAINE HCL (PF) 1 % IJ SOLN
INTRAMUSCULAR | Status: DC | PRN
  Administered 2022-05-24: 60 mL

## 2022-05-24 MED ORDER — FENTANYL CITRATE (PF) 100 MCG/2ML IJ SOLN
INTRAMUSCULAR | Status: DC | PRN
  Administered 2022-05-24: 25 ug via INTRAVENOUS

## 2022-05-24 MED ORDER — SODIUM CHLORIDE 0.9 % IV SOLN
INTRAVENOUS | Status: AC
  Filled 2022-05-24: qty 2

## 2022-05-24 MED ORDER — CEFAZOLIN SODIUM-DEXTROSE 1-4 GM/50ML-% IV SOLN
1.0000 g | Freq: Four times a day (QID) | INTRAVENOUS | Status: DC
  Administered 2022-05-24: 1 g via INTRAVENOUS
  Filled 2022-05-24: qty 50

## 2022-05-24 MED ORDER — SODIUM CHLORIDE 0.9 % IV SOLN
80.0000 mg | INTRAVENOUS | Status: AC
  Administered 2022-05-24: 80 mg

## 2022-05-24 MED ORDER — MIDAZOLAM HCL 5 MG/5ML IJ SOLN
INTRAMUSCULAR | Status: DC | PRN
  Administered 2022-05-24: 1 mg via INTRAVENOUS

## 2022-05-24 MED ORDER — CEFAZOLIN SODIUM-DEXTROSE 2-4 GM/100ML-% IV SOLN
2.0000 g | INTRAVENOUS | Status: AC
  Administered 2022-05-24: 2 g via INTRAVENOUS

## 2022-05-24 MED ORDER — HEPARIN (PORCINE) IN NACL 1000-0.9 UT/500ML-% IV SOLN
INTRAVENOUS | Status: AC
  Filled 2022-05-24: qty 500

## 2022-05-24 MED ORDER — HEPARIN (PORCINE) IN NACL 1000-0.9 UT/500ML-% IV SOLN
INTRAVENOUS | Status: DC | PRN
  Administered 2022-05-24: 500 mL

## 2022-05-24 MED ORDER — CHLORHEXIDINE GLUCONATE 4 % EX LIQD
4.0000 | Freq: Once | CUTANEOUS | Status: DC
  Filled 2022-05-24: qty 60

## 2022-05-24 MED ORDER — FENTANYL CITRATE (PF) 100 MCG/2ML IJ SOLN
INTRAMUSCULAR | Status: AC
  Filled 2022-05-24: qty 2

## 2022-05-24 MED ORDER — ONDANSETRON HCL 4 MG/2ML IJ SOLN: 4.0000 mg | Freq: Four times a day (QID) | INTRAMUSCULAR | Status: DC | PRN

## 2022-05-24 MED ORDER — LIDOCAINE HCL 1 % IJ SOLN
INTRAMUSCULAR | Status: AC
  Filled 2022-05-24: qty 60

## 2022-05-24 MED ORDER — MIDAZOLAM HCL 5 MG/5ML IJ SOLN
INTRAMUSCULAR | Status: AC
  Filled 2022-05-24: qty 5

## 2022-05-24 MED ORDER — POVIDONE-IODINE 10 % EX SWAB
2.0000 | Freq: Once | CUTANEOUS | Status: AC
  Administered 2022-05-24: 2 via TOPICAL

## 2022-05-24 MED ORDER — SODIUM CHLORIDE 0.9 % IV SOLN: INTRAVENOUS | Status: DC

## 2022-05-24 MED ORDER — ACETAMINOPHEN 325 MG PO TABS: 325.0000 mg | ORAL_TABLET | ORAL | Status: DC | PRN

## 2022-05-24 MED ORDER — CEFAZOLIN SODIUM-DEXTROSE 2-4 GM/100ML-% IV SOLN
INTRAVENOUS | Status: AC
  Filled 2022-05-24: qty 100

## 2022-05-24 SURGICAL SUPPLY — 7 items
CABLE SURGICAL S-101-97-12 (CABLE) ×1 IMPLANT
ICD VIGILANT VR D232 (Pacemaker) IMPLANT
LEAD RELIANCE 0673 IMPLANT
PAD DEFIB RADIO PHYSIO CONN (PAD) ×1 IMPLANT
SHEATH 8FR PRELUDE SNAP 13 (SHEATH) IMPLANT
SHEATH PROBE COVER 6X72 (BAG) IMPLANT
TRAY PACEMAKER INSERTION (PACKS) ×1 IMPLANT

## 2022-05-24 NOTE — Telephone Encounter (Signed)
LM to return my call. 

## 2022-05-24 NOTE — Telephone Encounter (Signed)
-----   Message from Park Liter, MD sent at 05/24/2022  9:32 AM EDT ----- Blood tests were good however potassium still be elevated lets repeat Chem-7 this coming week

## 2022-05-24 NOTE — H&P (Signed)
Electrophysiology Office Note   Date:  05/24/2022   ID:  Roger, Smith 08-29-1941, MRN [id number]  PCP:  Paulina Fusi, MD  Cardiologist:  Johnanna Schneiders Primary Electrophysiologist:  Regan Lemming, MD    Chief Complaint: CHF   History of Present Illness: Roger Smith is a 81 y.o. male who is being seen today for the evaluation of CHF at the request of No ref. provider found. Presenting today for electrophysiology evaluation.  He has a history seen for hypertension, hyperlipidemia, right bundle branch block, TIA.  He had exertional chest pain.  Left heart catheterization showed three-vessel coronary artery disease.  He is status post CABG x 4 on 01/23/2022.  He had atrial fibrillation postop and converted to sinus rhythm with amiodarone.    June 2023 he had a syncopal episode.  His blood pressure was quite low.  He was transferred to the emergency room.  He had a second syncopal episode and thus a LifeVest was placed.  He was found to have an ejection fraction of 20 to 25%.  Today, denies symptoms of palpitations, chest pain, shortness of breath, orthopnea, PND, lower extremity edema, claudication, dizziness, presyncope, syncope, bleeding, or neurologic sequela. The patient is tolerating medications without difficulties. Plan ICD today.    Past Medical History:  Diagnosis Date   Chronic GERD    Coronary artery disease    Frequent PVCs    Hyperlipidemia    Myocardial infarction (HCC)    RBBB (right bundle branch block with left anterior fascicular block)    Syncope and collapse    TIA (transient ischemic attack)    Past Surgical History:  Procedure Laterality Date   CATARACT EXTRACTION Bilateral 2015   CORONARY ARTERY BYPASS GRAFT N/A 01/23/2022   Procedure: CORONARY ARTERY BYPASS GRAFTING (CABG) X4 USING LEFT INTERNAL MAMMARY ARTERY, LEFT RADIAL ARTERY, AND RIGHT GREATER SAPHENOUS VEIN HARVESTED ENDOSCOPICALLY;  Surgeon: Lovett Sox, MD;  Location: [facility] OR;   Service: Open Heart Surgery;  Laterality: N/A;   INGUINAL HERNIA REPAIR Right 2005   INGUINAL HERNIA REPAIR Left 2017   Dr. Dimas Chyle   LEFT HEART CATH AND CORONARY ANGIOGRAPHY N/A 01/02/2022   Procedure: LEFT HEART CATH AND CORONARY ANGIOGRAPHY;  Surgeon: Kathleene Hazel, MD;  Location: [facility] INVASIVE CV LAB;  Service: Cardiovascular;  Laterality: N/A;   RADIAL ARTERY HARVEST Left 01/23/2022   Procedure: RADIAL ARTERY HARVEST;  Surgeon: Lovett Sox, MD;  Location: [facility] OR;  Service: Open Heart Surgery;  Laterality: Left;   TEE WITHOUT CARDIOVERSION N/A 01/23/2022   Procedure: TRANSESOPHAGEAL ECHOCARDIOGRAM (TEE);  Surgeon: Lovett Sox, MD;  Location: [facility] OR;  Service: Open Heart Surgery;  Laterality: N/A;     Current Facility-Administered Medications  Medication Dose Route Frequency Provider Last Rate Last Admin   0.9 %  sodium chloride infusion   Intravenous Continuous Regan Lemming, MD 50 mL/hr at 05/24/22 1109 New Bag at 05/24/22 1109   ceFAZolin (ANCEF) IVPB 2g/100 mL premix  2 g Intravenous On Call Talasia Saulter Daphine Deutscher, MD       chlorhexidine (HIBICLENS) 4 % liquid 4 Application  4 Application Topical Once Shriya Aker Daphine Deutscher, MD       gentamicin (GARAMYCIN) 80 mg in sodium chloride 0.9 % 500 mL irrigation  80 mg Irrigation On Call Regan Lemming, MD        Allergies:   Patient has no known allergies.   Social History:  The patient  reports that he has never smoked. He has  never used smokeless tobacco. He reports that he does not drink alcohol and does not use drugs.   Family History:  The patient's family history includes Heart attack in his mother; Heart disease in his mother; Stomach cancer in his maternal grandfather.   ROS:  Please see the history of present illness.   Otherwise, review of systems is positive for none.   All other systems are reviewed and negative.   PHYSICAL EXAM: VS:  BP (!) 157/91   Pulse 77   Temp (!) 97 F (36.1 C) (Temporal)    Resp 16   Ht 5\' 9"  (1.753 m)   Wt 73.5 kg   SpO2 98%   BMI 23.92 kg/m  , BMI Body mass index is 23.92 kg/m. GEN: Well nourished, well developed, in no acute distress  HEENT: normal  Neck: no JVD, carotid bruits, or masses Cardiac: RRR; no murmurs, rubs, or gallops,no edema  Respiratory:  clear to auscultation bilaterally, normal work of breathing GI: soft, nontender, nondistended, + BS MS: no deformity or atrophy  Skin: warm and dry Neuro:  Strength and sensation are intact Psych: euthymic mood, full affect   Recent Labs: 01/24/2022: ALT 13 04/04/2022: Magnesium 2.4 05/17/2022: BUN 16; Creatinine, Ser 1.01; Hemoglobin 14.2; Platelets 203; Potassium 5.5; Sodium 139    Lipid Panel     Component Value Date/Time   CHOL 64 01/25/2022 0439   TRIG 27 01/25/2022 0439   HDL 34 (L) 01/25/2022 0439   CHOLHDL 1.9 01/25/2022 0439   VLDL 5 01/25/2022 0439   LDLCALC 25 01/25/2022 0439     Wt Readings from Last 3 Encounters:  05/24/22 73.5 kg  05/17/22 76.1 kg  04/09/22 74.9 kg      Other studies Reviewed: Additional studies/ records that were reviewed today include: TTE 04/04/22  Review of the above records today demonstrates:   1. GLS -14.8. Left ventricular ejection fraction, by estimation, is 25 to  30%. The left ventricle has severely decreased function. The left  ventricle has no regional wall motion abnormalities. Left ventricular  diastolic parameters are consistent with  Grade I diastolic dysfunction (impaired relaxation).   2. Right ventricular systolic function is normal. The right ventricular  size is normal. There is normal pulmonary artery systolic pressure.   3. The mitral valve is normal in structure. Mild mitral valve  regurgitation. No evidence of mitral stenosis.   4. The aortic valve is normal in structure. Aortic valve regurgitation is  trivial. Aortic valve sclerosis is present, with no evidence of aortic  valve stenosis.   5. The inferior vena cava is  normal in size with greater than 50%  respiratory variability, suggesting right atrial pressure of 3 mmHg.   LHC 01/02/22   Prox RCA lesion is 99% stenosed.   Mid RCA lesion is 40% stenosed.   Ost Cx to Prox Cx lesion is 99% stenosed.   Mid LM to Dist LM lesion is 50% stenosed.   2nd Diag lesion is 60% stenosed.   Mid LAD lesion is 100% stenosed.   Ost LAD to Prox LAD lesion is 70% stenosed.  ASSESSMENT AND PLAN:  1.  Chronic systolic heart failure:  ICD Criteria  Current LVEF:33%. Within 12 months prior to implant: Yes   Heart failure history: Yes, Class II  Cardiomyopathy history: Yes, Non-Ischemic Cardiomyopathy.  Atrial Fibrillation/Atrial Flutter: No.  Ventricular tachycardia history: No.  Cardiac arrest history: No.  History of syndromes with risk of sudden death: No.  Previous ICD: No.  Current ICD indication: Primary  PPM indication: No.  Class I or II Bradycardia indication present: No  Beta Blocker therapy for 3 or more months: Yes, prescribed.   Ace Inhibitor/ARB therapy for 3 or more months: Yes, prescribed.    I have seen Ivie Maese is a 81 y.o. malepre-procedural and has been referred by [facility] for consideration of ICD implant for primary prevention of sudden death.  The patient's chart has been reviewed and they meet criteria for ICD implant.  I have had a thorough discussion with the patient reviewing options.  The patient and their family (if available) have had opportunities to ask questions and have them answered. The patient and I have decided together through the [facility] Support Tool to implant ICD at this time.  Risks, benefits, alternatives to ICD implantation were discussed in detail with the patient today. The patient  understands that the risks include but are not limited to bleeding, infection, pneumothorax, perforation, tamponade, vascular damage, renal failure, MI, stroke, death, inappropriate shocks, and lead  dislodgement and  wishes to proceed.

## 2022-05-24 NOTE — Discharge Instructions (Signed)
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 After 7 days, you may progress as below.  You should remove your sling 24 hours after your procedure, unless otherwise instructed by your provider.     Friday May 31, 2022  Saturday June 01, 2022 Sunday June 02, 2022 Monday June 03, 2022   Do not lift, push, pull, or carry anything over 10 pounds with the affected arm until 6 weeks (Friday July 05, 2022 ) after your procedure.   You may drive AFTER your wound check, unless you have been told otherwise by your provider.   Ask your healthcare provider when you can go back to work   INCISION/Dressing If you are on a blood thinner such as Coumadin, Xarelto, Eliquis, Plavix, or Pradaxa please confirm with your provider when this should be resumed.   If large square, outer bandage is left in place, this can be removed after 24 hours from your procedure. Do not remove steri-strips or glue as below.   Monitor your defibrillator site for redness, swelling, and drainage. Call the device clinic at [phone] if you experience these symptoms or fever/chills.  If your incision is sealed with Steri-strips or staples, you may shower 7 days after your procedure or when told by your provider. Do not remove the steri-strips or let the shower hit directly on your site. You may wash around your site with soap and water.    If you were discharged in a sling, please do not wear this during the day more than 48 hours after your surgery unless otherwise instructed. This may increase the risk of stiffness and soreness in your shoulder.   Avoid lotions, ointments, or perfumes over your incision until it is well-healed.  You may use a hot tub or a pool AFTER your wound check appointment if the incision is completely closed.  Your ICD is designed to protect you from life  threatening heart rhythms. Because of this, you may receive a shock.   1 shock with no symptoms:  Call the office during business hours. 1 shock with symptoms (chest pain, chest pressure, dizziness, lightheadedness, shortness of breath, overall feeling unwell):  Call 911. If you experience 2 or more shocks in 24 hours:  Call 911. If you receive a shock, you should not drive for 6 months per the [place] DMV IF you receive appropriate therapy from your ICD.   ICD Alerts:  Some alerts are vibratory and others beep. These are NOT emergencies. Please call our office to let us know. If this occurs at night or on weekends, it can wait until the next business day. Send a remote transmission.  If your device is capable of reading fluid status (for heart failure), you will be offered monthly monitoring to review this with you.   DEVICE MANAGEMENT Remote monitoring is used to monitor your ICD from home. This monitoring is scheduled every 91 days by our office. It allows [place] to keep an eye on the functioning of your device to ensure it is working properly. You will routinely see your Electrophysiologist annually (more often if necessary).   You should receive your ID card for your new device in 4-8 weeks. Keep this card with you at all times once received. Consider wearing a medical alert bracelet or necklace.  Your ICD  may be MRI compatible. This will be discussed at your  next office visit/wound check.  You should avoid contact with strong electric or magnetic fields.   Do not use amateur (ham) radio equipment or electric (arc) welding torches. MP3 player headphones with magnets should not be used. Some devices are safe to use if held at least 12 inches (30 cm) from your defibrillator. These include power tools, lawn mowers, and speakers. If you are unsure if something is safe to use, ask your health care provider.  When using your cell phone, hold it to the ear that is on the opposite side from the  defibrillator. Do not leave your cell phone in a pocket over the defibrillator.  You may safely use electric blankets, heating pads, computers, and microwave ovens.  Call the office right away if: You have chest pain. You feel more than one shock. You feel more short of breath than you have felt before. You feel more light-headed than you have felt before. Your incision starts to open up.  This information is not intended to replace advice given to you by your health care provider. Make sure you discuss any questions you have with your health care provider.

## 2022-05-24 NOTE — Progress Notes (Signed)
Pt assisted with redress and left UE shoulder immobilizer  

## 2022-05-24 NOTE — Progress Notes (Signed)
Dr Camnitz notified of post CXR-advised to proceed with d/c 

## 2022-05-27 ENCOUNTER — Telehealth: Payer: Self-pay

## 2022-05-27 ENCOUNTER — Encounter (HOSPITAL_COMMUNITY): Payer: Self-pay | Admitting: Cardiology

## 2022-05-27 NOTE — Telephone Encounter (Signed)
Spoke with pt's daughter penny- per DPR- Per Dr. Wendy Poet note. She stated that the pt has an appt to see Dr. Aundra Dubin tomorrow and because he is staying with her out of town she will ask if Dr. Oleh Genin office can draw a BMP tomorrow to recheck his Potassium.

## 2022-05-28 ENCOUNTER — Other Ambulatory Visit (HOSPITAL_COMMUNITY): Payer: Self-pay

## 2022-05-28 ENCOUNTER — Ambulatory Visit (HOSPITAL_COMMUNITY)
Admission: RE | Admit: 2022-05-28 | Discharge: 2022-05-28 | Disposition: A | Discharge status: Home / Self Care | Payer: Medicare Other | Source: Ambulatory Visit | Attending: Cardiology | Admitting: Cardiology

## 2022-05-28 ENCOUNTER — Other Ambulatory Visit (HOSPITAL_COMMUNITY): Payer: Medicare Other

## 2022-05-28 VITALS — BP 150/90 | HR 69 | Wt 166.4 lb

## 2022-05-28 DIAGNOSIS — I5022 Chronic systolic (congestive) heart failure: Secondary | ICD-10-CM | POA: Diagnosis not present

## 2022-05-28 DIAGNOSIS — E785 Hyperlipidemia, unspecified: Secondary | ICD-10-CM

## 2022-05-28 LAB — BASIC METABOLIC PANEL
Anion gap: 4 — ABNORMAL LOW (ref 5–15)
BUN: 16 mg/dL (ref 8–23)
CO2: 28 mmol/L (ref 22–32)
Calcium: 10.2 mg/dL (ref 8.9–10.3)
Chloride: 108 mmol/L (ref 98–111)
Creatinine, Ser: 0.93 mg/dL (ref 0.61–1.24)
GFR, Estimated: >60 mL/min (ref >60)
Glucose, Bld: 90 mg/dL (ref 70–99)
Potassium: 4.1 mmol/L (ref 3.5–5.1)
Sodium: 140 mmol/L (ref 135–145)

## 2022-05-28 LAB — LIPID PANEL
Cholesterol: 118 mg/dL (ref 0–200)
HDL: 45 mg/dL (ref >40)
LDL Cholesterol: 62 mg/dL (ref 0–99)
Total CHOL/HDL Ratio: 2.6 RATIO
Triglycerides: 53 mg/dL (ref <150)
VLDL: 11 mg/dL (ref 0–40)

## 2022-05-28 LAB — BRAIN NATRIURETIC PEPTIDE: B Natriuretic Peptide: 135.5 pg/mL — ABNORMAL HIGH (ref 0.0–100.0)

## 2022-05-28 MED ORDER — ENTRESTO 24-26 MG PO TABS: 1.0000 | ORAL_TABLET | Freq: Two times a day (BID) | ORAL | 11 refills | Status: DC

## 2022-05-28 NOTE — Patient Instructions (Addendum)
Start Entresto 24/26 Twice daily  Stop Losartan   Labs done today, your results will be available in MyChart, we will contact you for abnormal readings.  Repeat blood work in 10 days  You have been referred to Cardiac Rehab at [place]. They will call you to arrange your appointment.  Please follow up with our heart failure pharmacist in 3 weeks  Your physician recommends that you schedule a follow-up appointment in: 2 months  If you have any questions or concerns before your next appointment please send [place] a message through [place] or call our office at [phone].    TO LEAVE A MESSAGE FOR THE NURSE SELECT OPTION 2, PLEASE LEAVE A MESSAGE INCLUDING: YOUR NAME DATE OF BIRTH CALL BACK NUMBER REASON FOR CALL**this is important as we prioritize the call backs  YOU WILL RECEIVE A CALL BACK THE SAME DAY AS LONG AS YOU CALL BEFORE 4:00 PM  At the [facility], you and your health needs are our priority. As part of our continuing mission to provide you with exceptional heart care, we have created designated Provider Care Teams. These Care Teams include your primary Cardiologist (physician) and Advanced Practice Providers (APPs- Physician Assistants and Nurse Practitioners) who all work together to provide you with the care you need, when you need it.   You may see any of the following providers on your designated Care Team at your next follow up: Dr Glori Bickers Dr Loralie Champagne Dr. Roxana Hires, NP Lyda Jester, [place] [facility] [place], [place] Forestine Na, NP Audry Riles, PharmD   Please be sure to bring in all your medications bottles to every appointment.

## 2022-05-29 NOTE — Progress Notes (Signed)
ADVANCED HF CLINIC NOTE  PCP: Paulina Fusi, MD HF Cardiologist: Dr. Shirlee Latch  HPI: Roger Smith is a 81 y.o. male with hx HTN, HLD, RBBB, h/o TIA (noted in problem list). He is a nondiabetic and nonsmoker.    Had exertional CP and syncopal episode in church prompting evaluation by cardiology in [place]. Echo at [facility] with EF 50% and inferior hypokinesis. Coronary calcium score 700. Subsequently had LHC in 5/23 which demonstrated 3 v CAD including LM disease. Echo at [facility] prior to CABG: EF < 20%, RV moderately reduced. He underwent CABG X 4 (LIMA to LAD, SVG to diagonal, left radial to OM, SVG to PDA) 01/23/22.  He did have post-operative atrial fibrillation and converted to NSR with IV amiodarone >> later switched to PO. PVCs also noted on tele. Anticoagulation was noted started.  Diuresed with lasix and metolazone. Discharged on amiodarone, Coreg 6.25 BID, Farxiga 10 mg daily, Furosemide 20 mg daily X 5 days then stop.    Seen in CT surgery 02/04/22 for suture removal. Became clammy and diaphoretic and had episode of decreased responsiveness. He was hypotensive with BP 79/54, improved to 113/64 on recheck. Coreg held for a day then given instructions to restart at lower dose 3.215 BID and stopped [place]. Diuretic discontinued. Amiodarone reduced to 200 mg daily.  Started on abx for possible UTI, likely related to [place] use.   Follow up 6/23 in [facility], had syncopal episode, rapid response and code called. BP 90 on doppler, CBG 74. Given 1L NS and became more responsive. Evaluated by Dr. Shirlee Latch and transported to ED for further work up. Felt episode was secondary to hypotension and dehydration, however this was 2nd syncopal episode so Zio and LifeVest arranged.  Also found to have UTI again and treated with Keflex. Echo showed EF 20-25%. Carvedilol and Imdur stopped during admission.  Echo was done in 8/23, showing EF 25-30%, RV normal, mild MR, IVC normal.  Patient then had [employer]  ICD placed.   Today he returns for HF follow up with his daughter. Seems to be doing well symptomatically.  Walks 1.5 miles/day without exertional dyspnea or chest pain.  No lightheadedness.  SBP running in 110s at home. No orthopnea/PND.  No palpitations.   ECG (personally reviewed): NSR, RBBB, LAFB  Labs (6/23): K 4.2, creatinine 0.93, hgb 11.3 Labs (7/23): K 4.3, creatinine 0.93 Labs (9/23): K 5.5, creatinine 1.01  PMH: 1. GERD 2. Hyperlipidemia 3. H/o TIA 4. PVCs: Zio monitor (6/23) with 6.3% PVCs, runs of NSVT.  5. CAD: s/p CABG in 5/23 with LIMA-LAD, SVG-D, left radial-OM, SVG-PDA.  6. Chronic systolic CHF: Ischemic cardiomyopathy.  [employer] ICD.  - Echo (5/23): EF < 20%, RV moderately dysfunctional.  - Echo (8/23): EF 25-30%, RV normal, mild MR, IVC normal.  7. Syncope: In setting of UTI, dehydration.  8. Atrial fibrillation: Post-op in 5/23.   ROS: All systems reviewed and negative except as per HPI.   Current Outpatient Medications  Medication Sig Dispense Refill   amiodarone (PACERONE) 200 MG tablet 200mg  BID x 1 week then 200mg  daily (Patient taking differently: Take 200 mg by mouth daily.) 90 tablet 3   aspirin EC 325 MG tablet Take 1 tablet (325 mg total) by mouth daily.     carvedilol (COREG) 3.125 MG tablet Take 1 tablet (3.125 mg total) by mouth 2 (two) times daily. 180 tablet 1   famotidine (PEPCID) 40 MG tablet Take 40 mg by mouth daily before breakfast.  multivitamin-lutein (OCUVITE-LUTEIN) CAPS capsule Take 1 capsule by mouth 2 (two) times daily.     Omega-3 Fatty Acids (FISH OIL) 1200 MG CAPS Take 1,200 mg by mouth daily.     rosuvastatin (CRESTOR) 10 MG tablet Take 10 mg by mouth at bedtime.     sacubitril-valsartan (ENTRESTO) 24-26 MG Take 1 tablet by mouth 2 (two) times daily. 60 tablet 11   spironolactone (ALDACTONE) 25 MG tablet Take 0.5 tablets (12.5 mg total) by mouth daily. 90 tablet 0   No current facility-administered medications for this  encounter.   No Known Allergies  Social History   Socioeconomic History   Marital status: Widowed    Spouse name: Not on file   Number of children: Not on file   Years of education: Not on file   Highest education level: Not on file  Occupational History   Not on file  Tobacco Use   Smoking status: Never   Smokeless tobacco: Never  Vaping Use   Vaping Use: Never used  Substance and Sexual Activity   Alcohol use: Never   Drug use: Never   Sexual activity: Not on file  Other Topics Concern   Not on file  Social History Narrative   Not on file   Social Determinants of Health   Financial Resource Strain: Not on file  Food Insecurity: Not on file  Transportation Needs: No Transportation Needs (02/09/2022)   PRAPARE - Administrator, Civil Service (Medical): No    Lack of Transportation (Non-Medical): No  Physical Activity: Not on file  Stress: Not on file  Social Connections: Unknown (02/09/2022)   Social Connection and Isolation Panel [NHANES]    Frequency of Communication with Friends and Family: Twice a week    Frequency of Social Gatherings with Friends and Family: Twice a week    Attends Religious Services: Not on Marketing executive or Organizations: Yes    Attends Banker Meetings: 1 to 4 times per year    Marital Status: Married  Catering manager Violence: Not on file   Family History  Problem Relation Age of Onset   Heart attack Mother    Heart disease Mother    Stomach cancer Maternal Grandfather    BP (!) 150/90   Pulse 69   Wt 75.5 kg (166 lb 6.4 oz)   SpO2 93%   BMI 24.57 kg/m   Wt Readings from Last 3 Encounters:  05/28/22 75.5 kg (166 lb 6.4 oz)  05/24/22 73.5 kg (162 lb)  05/17/22 76.1 kg (167 lb 12.8 oz)   PHYSICAL EXAM: General: NAD Neck: No JVD, no thyromegaly or thyroid nodule.  Lungs: Clear to auscultation bilaterally with normal respiratory effort. CV: Nondisplaced PMI.  Heart regular S1/S2, no  S3/S4, 1/6 SEM RUSB.  Trace ankle edema.  No carotid bruit.  Normal pedal pulses.  Abdomen: Soft, nontender, no hepatosplenomegaly, no distention.  Skin: Intact without lesions or rashes.  Neurologic: Alert and oriented x 3.  Psych: Normal affect. Extremities: No clubbing or cyanosis.  HEENT: Normal.   ASSESSMENT & PLAN: 1. Chronic systolic CHF:  Ischemic cardiomyopathy.  [employer] ICD.  Echo (3/23 at [facility]) showed EF 50% with inferior hypokinesis Echo (5/23) worsened with EF < 20%, RV moderately reduced.  LHC (5/23) showed 3 v CAD including moderate LM disease.  No s/p CABG X4.  NYHA class II, not volume overloaded on exam.  Has had trouble with hypotension/lightheadedness but this seems  to have been in the setting of UTIs/dehydration.  - Continue carvedilol 3.125 mg bid. - Continue spironolactone 12.5 daily.  Recent K elevated, will repeat BMET today.  - Stop losartan and start Entresto 24/26 bid as long as K not markedly elevated.  BMET 10 days.  - No SGLT2i with recent UTIs. 2. CAD: s/p CABG X 4 5/23.  No chest pain.  - Continue ASA - Continue statin, check lipids today.  - Refer to cardiac rehab at [facility].  3. Post-operative atrial fibrillation: No definite recurrence.  He is not anticoagulated. NSR today.  - Will need anticoagulation if recurrence 4. PVCs/VT: 14 day holter (4/23): Multiple episodes VT, longest 12.8 seconds, 7.4% PVC burden.  Zio 14 day (6/23) showed frequent ventricular ectopy (6.3%) and several runs of NSVT. - He remains on amiodarone 200 mg daily.  Check LFTs, TSH today. Will need regular eye exam.    Followup HF pharmacist in 3 wks for med titration, followup APP in 2 months.    Loralie Champagne 05/29/22

## 2022-06-04 ENCOUNTER — Telehealth: Payer: Self-pay

## 2022-06-04 NOTE — Telephone Encounter (Signed)
Left message for Pt advising to make sure bedside monitor is plugged in.  Requested he send a manual transmission.  Advised to call device clinic if any questions.

## 2022-06-05 ENCOUNTER — Other Ambulatory Visit (HOSPITAL_COMMUNITY): Payer: Self-pay

## 2022-06-05 ENCOUNTER — Ambulatory Visit
Admission: RE | Admit: 2022-06-05 | Discharge: 2022-06-05 | Disposition: A | Discharge status: Home / Self Care | Payer: Medicare Other | Source: Ambulatory Visit | Attending: Cardiology | Admitting: Cardiology

## 2022-06-05 ENCOUNTER — Ambulatory Visit: Payer: Medicare Other | Attending: Cardiology

## 2022-06-05 DIAGNOSIS — I255 Ischemic cardiomyopathy: Secondary | ICD-10-CM

## 2022-06-05 DIAGNOSIS — Z95810 Presence of automatic (implantable) cardiac defibrillator: Secondary | ICD-10-CM | POA: Diagnosis not present

## 2022-06-05 DIAGNOSIS — Z951 Presence of aortocoronary bypass graft: Secondary | ICD-10-CM | POA: Diagnosis not present

## 2022-06-05 LAB — CUP PACEART INCLINIC DEVICE CHECK
Date Time Interrogation Session: 20231004000000
HighPow Impedance: 62 Ohm
Implantable Lead Implant Date: 20230922
Implantable Lead Location: 753860
Implantable Lead Model: 673
Implantable Lead Serial Number: 201030
Implantable Pulse Generator Implant Date: 20230922
Lead Channel Impedance Value: 570 Ohm
Lead Channel Pacing Threshold Amplitude: 4 V
Lead Channel Pacing Threshold Pulse Width: 1 ms
Lead Channel Sensing Intrinsic Amplitude: 14.5 mV
Lead Channel Setting Pacing Amplitude: 3.5 V
Lead Channel Setting Pacing Pulse Width: 0.4 ms
Lead Channel Setting Sensing Sensitivity: 0.5 mV
Pulse Gen Serial Number: 318429

## 2022-06-05 NOTE — Patient Instructions (Addendum)
Medication Instructions:  Your physician recommends that you continue on your current medications as directed. Please refer to the Current Medication list given to you today.  Labwork: None ordered.  Testing/Procedures: A chest x-ray takes a picture of the organs and structures inside the chest, including the heart, lungs, and blood vessels. This test can show several things, including, whether the heart is enlarges; whether fluid is building up in the lungs; and whether pacemaker / defibrillator leads are still in place.   Follow-Up:  Based on results of chest xray  [facility] center in [place] Address: [address] [place], [place], [place] [address] Hours:  Open ? Closes 7?PM Phone: [phone]

## 2022-06-05 NOTE — Progress Notes (Signed)
Wound check appointment. Steri-strips removed. Wound without redness or edema. Incision edges approximated, wound well healed.   RV impedance 570 ohms- down from 1124 ohms at implant.   RV threshold tested.    RV capture at 4.0 V at 1 ms- loss of capture noted at 3.5 V at 1 ms.  Implant RV threshold 1 V at 0.5 ms.  Discussed with Dr. Curt Bears.  Will get chest xray.  Further recommendations to follow.

## 2022-06-06 ENCOUNTER — Telehealth: Payer: Self-pay

## 2022-06-06 NOTE — Telephone Encounter (Signed)
Outreach made to Pt's daughter per Pt request with results of chest xray.  Advised that per Xray-lead appears to be in same position as implant.  Per Dr. Curt Bears, will continue to monitor closely and will get a remote check in 2 weeks.  Pt will be going home on Saturday and will plug in monitor at that time.  Will continue to monitor for receipt of transmission and will schedule additional remote transmission in 2 weeks.

## 2022-06-10 ENCOUNTER — Telehealth: Payer: Self-pay

## 2022-06-10 ENCOUNTER — Other Ambulatory Visit (HOSPITAL_COMMUNITY): Payer: Self-pay | Admitting: Cardiology

## 2022-06-10 DIAGNOSIS — I5022 Chronic systolic (congestive) heart failure: Secondary | ICD-10-CM | POA: Diagnosis not present

## 2022-06-10 NOTE — Telephone Encounter (Signed)
Device alert Right ventricular automatic threshold detected as > programmed amplitude or suspended RV threshold 0.8V @ 0.76ms at implant, last 4V @ 69ms, programmed trend 3.5V @ 0.51ms Decrease in both RV amplitude 64mV (implant 59mV) and impedance 541 ohms (implant 850 ohms) since implant Presenting regular VS Route to triage.  Routing to Dr. Curt Bears as follow up.

## 2022-06-11 LAB — BASIC METABOLIC PANEL
BUN/Creatinine Ratio: 11 (ref 10–24)
BUN: 11 mg/dL (ref 8–27)
CO2: 23 mmol/L (ref 20–29)
Calcium: 10.3 mg/dL — ABNORMAL HIGH (ref 8.6–10.2)
Chloride: 103 mmol/L (ref 96–106)
Creatinine, Ser: 0.96 mg/dL (ref 0.76–1.27)
Glucose: 89 mg/dL (ref 70–99)
Potassium: 5.1 mmol/L (ref 3.5–5.2)
Sodium: 140 mmol/L (ref 134–144)
eGFR: 80 mL/min/{1.73_m2} (ref >59)

## 2022-06-12 NOTE — Telephone Encounter (Signed)
Scheduler aware schedule f/u with WC to discuss lead.

## 2022-06-14 ENCOUNTER — Encounter: Payer: Self-pay | Admitting: *Deleted

## 2022-06-14 ENCOUNTER — Ambulatory Visit: Payer: Medicare Other | Attending: Cardiology | Admitting: Cardiology

## 2022-06-14 ENCOUNTER — Encounter: Payer: Self-pay | Admitting: Cardiology

## 2022-06-14 VITALS — BP 132/72 | HR 67 | Ht 69.0 in | Wt 166.0 lb

## 2022-06-14 DIAGNOSIS — Z01818 Encounter for other preprocedural examination: Secondary | ICD-10-CM | POA: Diagnosis not present

## 2022-06-14 DIAGNOSIS — I5022 Chronic systolic (congestive) heart failure: Secondary | ICD-10-CM | POA: Diagnosis not present

## 2022-06-14 NOTE — Patient Instructions (Signed)
Medication Instructions:  None  *If you need a refill on your cardiac medications before your next appointment, please call your pharmacy*   Lab Work: Around Nov 7 -printed orders  If you have labs (blood work) drawn today and your tests are completely normal, you will receive your results only by: [place] (if you have MyChart) OR A paper copy in the mail If you have any lab test that is abnormal or we need to change your treatment, we will call you to review the results.   Testing/Procedures: Your physician has recommended that you have a defibrillator lead revision.    Follow-Up: At [facility], you and your health needs are our priority.  As part of our continuing mission to provide you with exceptional heart care, we have created designated Provider Care Teams.  These Care Teams include your primary Cardiologist (physician) and Advanced Practice Providers (APPs -  Physician Assistants and Nurse Practitioners) who all work together to provide you with the care you need, when you need it.  We recommend signing up for the patient portal called "MyChart".  Sign up information is provided on this After Visit Summary.  MyChart is used to connect with patients for Virtual Visits (Telemedicine).  Patients are able to view lab/test results, encounter notes, upcoming appointments, etc.  Non-urgent messages can be sent to your provider as well.   To learn more about what you can do with MyChart, go to [email or web address].    Your next appointment:   See instruction letter.   Important Information About Sugar

## 2022-06-14 NOTE — Progress Notes (Signed)
Electrophysiology Office Note   Date:  06/14/2022   ID:  Roger, Smith 03/25/1941, MRN [id number]  PCP:  Paulina Fusi, MD  Cardiologist:  Johnanna Schneiders Primary Electrophysiologist:  Regan Lemming, MD    Chief Complaint: CHF   History of Present Illness: Roger Smith is a 81 y.o. male who is being seen today for the evaluation of CHF at the request of Paulina Fusi, MD. Presenting today for electrophysiology evaluation.  He has a history significant for hypertension, hyperlipidemia, right bundle branch block, TIA.  He has exertional chest pain.  Left heart catheterization showed three-vessel coronary disease and he is now status post CABG x4 on 01/23/2022.  He had atrial fibrillation postoperatively but converted to sinus rhythm with amiodarone.  June 2023 he had an episode of syncope.  He was found to have low blood pressures.  He was transferred to the emergency room and was found to have an ejection fraction of 20 to 25%.  He is now status post [employer] ICD implanted 05/24/2022.  Unfortunately ventricular lead threshold has gone up significantly since implant.  Today, denies symptoms of palpitations, chest pain, shortness of breath, orthopnea, PND, lower extremity edema, claudication, dizziness, presyncope, syncope, bleeding, or neurologic sequela. The patient is tolerating medications without difficulties.     Past Medical History:  Diagnosis Date   Chronic GERD    Coronary artery disease    Frequent PVCs    Hyperlipidemia    Myocardial infarction (HCC)    RBBB (right bundle branch block with left anterior fascicular block)    Syncope and collapse    TIA (transient ischemic attack)    Past Surgical History:  Procedure Laterality Date   CATARACT EXTRACTION Bilateral 2015   CORONARY ARTERY BYPASS GRAFT N/A 01/23/2022   Procedure: CORONARY ARTERY BYPASS GRAFTING (CABG) X4 USING LEFT INTERNAL MAMMARY ARTERY, LEFT RADIAL ARTERY, AND RIGHT  GREATER SAPHENOUS VEIN HARVESTED ENDOSCOPICALLY;  Surgeon: Lovett Sox, MD;  Location: [facility] OR;  Service: Open Heart Surgery;  Laterality: N/A;   ICD IMPLANT N/A 05/24/2022   Procedure: ICD IMPLANT;  Surgeon: Regan Lemming, MD;  Location[facility] INVASIVE CV LAB;  Service: Cardiovascular;  Laterality: N/A;   INGUINAL HERNIA REPAIR Right 2005   INGUINAL HERNIA REPAIR Left 2017   Dr. Dimas Chyle   LEFT HEART CATH AND CORONARY ANGIOGRAPHY N/A 01/02/2022   Procedure: LEFT HEART CATH AND CORONARY ANGIOGRAPHY;  Surgeon: Kathleene Hazel, MD;  Location: [facility] INVASIVE CV LAB;  Service: Cardiovascular;  Laterality: N/A;   RADIAL ARTERY HARVEST Left 01/23/2022   Procedure: RADIAL ARTERY HARVEST;  Surgeon: Lovett Sox, MD;  Location: [facility] OR;  Service: Open Heart Surgery;  Laterality: Left;   TEE WITHOUT CARDIOVERSION N/A 01/23/2022   Procedure: TRANSESOPHAGEAL ECHOCARDIOGRAM (TEE);  Surgeon: Lovett Sox, MD;  Location: [facility] OR;  Service: Open Heart Surgery;  Laterality: N/A;     Current Outpatient Medications  Medication Sig Dispense Refill   amiodarone (PACERONE) 200 MG tablet 200mg  BID x 1 week then 200mg  daily (Patient taking differently: Take 200 mg by mouth daily.) 90 tablet 3   aspirin EC 325 MG tablet Take 1 tablet (325 mg total) by mouth daily.     carvedilol (COREG) 3.125 MG tablet Take 1 tablet (3.125 mg total) by mouth 2 (two) times daily. 180 tablet 1   famotidine (PEPCID) 40 MG tablet Take 40 mg by mouth daily before breakfast.     multivitamin-lutein (OCUVITE-LUTEIN) CAPS capsule Take 1 capsule by mouth  2 (two) times daily.     Omega-3 Fatty Acids (FISH OIL) 1200 MG CAPS Take 1,200 mg by mouth daily.     rosuvastatin (CRESTOR) 10 MG tablet Take 10 mg by mouth at bedtime.     sacubitril-valsartan (ENTRESTO) 24-26 MG Take 1 tablet by mouth 2 (two) times daily. 60 tablet 11   spironolactone (ALDACTONE) 25 MG tablet Take 0.5 tablets (12.5 mg total) by mouth daily. 90 tablet 0   No  current facility-administered medications for this visit.    Allergies:   Patient has no known allergies.   Social History:  The patient  reports that he has never smoked. He has never used smokeless tobacco. He reports that he does not drink alcohol and does not use drugs.   Family History:  The patient's family history includes Heart attack in his mother; Heart disease in his mother; Stomach cancer in his maternal grandfather.   ROS:  Please see the history of present illness.   Otherwise, review of systems is positive for none.   All other systems are reviewed and negative.   PHYSICAL EXAM: VS:  BP 132/72   Pulse 67   Ht 5\' 9"  (1.753 m)   Wt 166 lb (75.3 kg)   SpO2 95%   BMI 24.51 kg/m  , BMI Body mass index is 24.51 kg/m. GEN: Well nourished, well developed, in no acute distress  HEENT: normal  Neck: no JVD, carotid bruits, or masses Cardiac: RRR; no murmurs, rubs, or gallops,no edema  Respiratory:  clear to auscultation bilaterally, normal work of breathing GI: soft, nontender, nondistended, + BS MS: no deformity or atrophy  Skin: warm and dry, device site well healed Neuro:  Strength and sensation are intact Psych: euthymic mood, full affect  EKG:  EKG is not ordered today. Personal review of the ekg ordered 05/28/22 shows sinus rhythm, right bundle branch block, rate 65  Personal review of the device interrogation today. Results in [facility]   Recent Labs: 01/24/2022: ALT 13 04/04/2022: Magnesium 2.4 05/17/2022: Hemoglobin 14.2; Platelets 203 05/28/2022: B Natriuretic Peptide 135.5 06/10/2022: BUN 11; Creatinine, Ser 0.96; Potassium 5.1; Sodium 140    Lipid Panel     Component Value Date/Time   CHOL 118 05/28/2022 1449   TRIG 53 05/28/2022 1449   HDL 45 05/28/2022 1449   CHOLHDL 2.6 05/28/2022 1449   VLDL 11 05/28/2022 1449   LDLCALC 62 05/28/2022 1449     Wt Readings from Last 3 Encounters:  06/14/22 166 lb (75.3 kg)  05/28/22 166 lb 6.4 oz (75.5 kg)   05/24/22 162 lb (73.5 kg)      Other studies Reviewed: Additional studies/ records that were reviewed today include: TTE 04/04/22  Review of the above records today demonstrates:   1. GLS -14.8. Left ventricular ejection fraction, by estimation, is 25 to  30%. The left ventricle has severely decreased function. The left  ventricle has no regional wall motion abnormalities. Left ventricular  diastolic parameters are consistent with  Grade I diastolic dysfunction (impaired relaxation).   2. Right ventricular systolic function is normal. The right ventricular  size is normal. There is normal pulmonary artery systolic pressure.   3. The mitral valve is normal in structure. Mild mitral valve  regurgitation. No evidence of mitral stenosis.   4. The aortic valve is normal in structure. Aortic valve regurgitation is  trivial. Aortic valve sclerosis is present, with no evidence of aortic  valve stenosis.   5. The inferior vena cava is  normal in size with greater than 50%  respiratory variability, suggesting right atrial pressure of 3 mmHg.   LHC 01/02/22   Prox RCA lesion is 99% stenosed.   Mid RCA lesion is 40% stenosed.   Ost Cx to Prox Cx lesion is 99% stenosed.   Mid LM to Dist LM lesion is 50% stenosed.   2nd Diag lesion is 60% stenosed.   Mid LAD lesion is 100% stenosed.   Ost LAD to Prox LAD lesion is 70% stenosed.  ASSESSMENT AND PLAN:  1.  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  Currently on carvedilol 3.125 mg twice daily, Aldactone 12.5 mg daily, losartan 12.5 mg daily.  Is status post [employer] ICD implanted 05/24/2022.  Since ICD implant, he has had significant increase in ICD lead threshold and drop in impedance.  He Lailani Tool require RV lead revision.  Risk and benefits of been discussed risk of bleeding, infection, tamponade, pneumothorax, lead dislodgment.  He understands these risks and is agreed to the procedure.  2.  Coronary artery disease:  Status post CABG x4.  Continue aspirin and statin per primary cardiology.  3.  Atrial fibrillation: Occurred postoperatively.  In sinus rhythm.  Continue with current management.  4.  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  Continue with current management.   Current medicines are reviewed at length with the patient today.   The patient does not have concerns regarding his medicines.  The following changes were made today: None  Labs/ tests ordered today include:  Orders Placed This Encounter  Procedures   CBC w/Diff   Basic Metabolic Panel (BMET)     Disposition:   FU with Roger Smith 3 months  Signed, Aashir Umholtz Meredith Leeds, MD  06/14/2022 10:44 AM     [address] [place] [phone] (office) [phone] (fax)

## 2022-06-18 ENCOUNTER — Ambulatory Visit (HOSPITAL_COMMUNITY)
Admission: RE | Admit: 2022-06-18 | Discharge: 2022-06-18 | Disposition: A | Discharge status: Home / Self Care | Payer: Medicare Other | Source: Ambulatory Visit | Attending: Cardiology | Admitting: Cardiology

## 2022-06-18 ENCOUNTER — Encounter (HOSPITAL_COMMUNITY): Payer: Self-pay

## 2022-06-18 VITALS — BP 132/76 | HR 71 | Wt 169.2 lb

## 2022-06-18 DIAGNOSIS — I110 Hypertensive heart disease with heart failure: Secondary | ICD-10-CM | POA: Insufficient documentation

## 2022-06-18 DIAGNOSIS — Z8673 Personal history of transient ischemic attack (TIA), and cerebral infarction without residual deficits: Secondary | ICD-10-CM | POA: Insufficient documentation

## 2022-06-18 DIAGNOSIS — I255 Ischemic cardiomyopathy: Secondary | ICD-10-CM | POA: Diagnosis not present

## 2022-06-18 DIAGNOSIS — E785 Hyperlipidemia, unspecified: Secondary | ICD-10-CM | POA: Insufficient documentation

## 2022-06-18 DIAGNOSIS — I9789 Other postprocedural complications and disorders of the circulatory system, not elsewhere classified: Secondary | ICD-10-CM | POA: Insufficient documentation

## 2022-06-18 DIAGNOSIS — I2510 Atherosclerotic heart disease of native coronary artery without angina pectoris: Secondary | ICD-10-CM | POA: Insufficient documentation

## 2022-06-18 DIAGNOSIS — I4510 Unspecified right bundle-branch block: Secondary | ICD-10-CM | POA: Diagnosis not present

## 2022-06-18 DIAGNOSIS — I5022 Chronic systolic (congestive) heart failure: Secondary | ICD-10-CM

## 2022-06-18 DIAGNOSIS — Z951 Presence of aortocoronary bypass graft: Secondary | ICD-10-CM | POA: Insufficient documentation

## 2022-06-18 MED ORDER — CARVEDILOL 6.25 MG PO TABS: 6.2500 mg | ORAL_TABLET | Freq: Two times a day (BID) | ORAL | 3 refills | Status: DC

## 2022-06-18 NOTE — Progress Notes (Signed)
Advanced Heart Failure Clinic Note     PCP: Paulina Fusi, MD HF Cardiologist: Dr. Shirlee Latch  HPI:  Roger Smith is a 81 y.o. male with hx HTN, HLD, RBBB, h/o TIA (noted in problem list). He is a nondiabetic and nonsmoker.    Had exertional CP and syncopal episode in church prompting evaluation by cardiology in [place]. Echo at [facility] with EF 50% and inferior hypokinesis. Coronary calcium score 700. Subsequently had LHC in 12/2021 which demonstrated 3 v CAD including LM disease. Echo at [facility] prior to CABG: EF < 20%, RV moderately reduced. He underwent CABG X 4 (LIMA to LAD, SVG to diagonal, left radial to OM, SVG to PDA) 01/23/2022. He did have post-operative atrial fibrillation and converted to NSR with IV amiodarone >> later switched to PO. PVCs also noted on tele. Anticoagulation was noted started.  Diuresed with lasix and metolazone. Discharged on amiodarone, carvedilol 6.25 BID, Farxiga 10 mg daily, Furosemide 20 mg daily X 5 days then stop.    Seen in CT surgery 02/04/2022 for suture removal. Became clammy and diaphoretic and had episode of decreased responsiveness. He was hypotensive with BP 79/54, improved to 113/64 on recheck. Carvedilol held for a day then given instructions to restart at lower dose 3.215 BID and stopped [place]. Diuretic discontinued. Amiodarone reduced to 200 mg daily.  Started on abx for possible UTI, likely related to [place] use.   Follow up 01/2022 in [facility] had syncopal episode, rapid response and code called. BP 90 on doppler, CBG 74. Given 1L NS and became more responsive. Evaluated by Dr. Shirlee Latch and transported to ED for further work up. Felt episode was secondary to hypotension and dehydration, however this was 2nd syncopal episode so Zio and LifeVest arranged.  Also found to have UTI again and treated with Keflex. Echo showed EF 20-25%. Carvedilol and Imdur stopped during admission.   Echo was done in 04/2022, showing EF 25-30%, RV normal, mild MR, IVC  normal.  Patient then had [employer] ICD placed.    Returned for HF follow up with his daughter on 05/28/2022. Seemed to be doing well symptomatically. Was walking 1.5 miles/day without exertional dyspnea or chest pain. No lightheadedness. SBP were running 110s at home. No orthopnea/PND. No palpitations.   Today he returns to HF clinic with his daughter for pharmacist medication titration. At last visit with MD, losartan 12.5 mg was discontinued and Entresto 24/26 mg BID was started. Overall feeling great. Denies dizziness, lightheadedness, fatigue, chest pain, or palpitations. Home BP readings are 100-110s/60-70s, endorses he has white coat hypertension. Breathing is unchanged from last visit. Only complaint today is occasional gas. Weight at home stable at 160-164 pounds. No LEE. Denies PND/Orthopnea.  HF Medications: Carvedilol 3.125 mg BID Entresto 24/26 mg BID Spironolactone 12.5 mg daily  Has the patient been experiencing any side effects to the medications prescribed?  No  Does the patient have any problems obtaining medications due to transportation or finances?   No; BCBS Medicare  Understanding of regimen: good Understanding of indications: good Potential of compliance: good Patient understands to avoid NSAIDs. Patient understands to avoid decongestants.    Pertinent Lab Values: (06/10/2022) Serum creatinine 0.96, BUN 11, Potassium 5.1, Sodium 140, (05/28/2022) BNP 135  Vital Signs: Weight: 169.2 lbs (last clinic weight: 166.4 lbs) Blood pressure: 132/76 mmHg  Heart rate: 71 bpm   Assessment/Plan: 1. Chronic systolic CHF:  Ischemic cardiomyopathy.  [employer] ICD.  Echo (10/2021 at [place]) showed EF 50% with  inferior hypokinesis Echo (12/2021) worsened with EF < 20%, RV moderately reduced.  LHC (12/2021) showed 3 v CAD including moderate LM disease.  No s/p CABG X4.  NYHA class II, not volume overloaded on exam.  Has had trouble with hypotension/lightheadedness but  this seems to have been in the setting of UTIs/dehydration.  - Increase carvedilol to 6.25 mg BID. - Continue Entresto 24/26 mg BID. - Continue spironolactone 12.5 mg daily. - No SGLT2i with recent UTIs. - Discussed adhering to a low potassium diet.   2. CAD: s/p CABG X 4 12/2021.  No chest pain.  - Continue ASA - Continue statin, lipids stable 05/28/2022.  - Referred to cardiac rehab at [facility].   3. Post-operative atrial fibrillation: No definite recurrence.  He is not anticoagulated. NSR at [place] on 05/28/2022.  - Will need anticoagulation if recurrence  4. PVCs/VT: 14 day holter (12/2021): Multiple episodes VT, longest 12.8 seconds, 7.4% PVC burden. Zio 14 day (01/2022) showed frequent ventricular ectopy (6.3%) and several runs of NSVT. - RV lead revision scheduled with Dr. Curt Bears on 07/23/2022. - He remains on amiodarone 200 mg daily. LFTs stable 01/24/2022, consider collecting TSH at follow up. Will need regular eye exam.    Follow up 07/29/2022 with APP clinic.   Joseph Art, Pharm.D. PGY-2 Ambulatory Care Pharmacy Resident  Kerby Nora, PharmD, [facility] Pharmacist [phone]

## 2022-06-18 NOTE — Patient Instructions (Signed)
It was a pleasure seeing you today!  MEDICATIONS: -We are changing your medications today -Increase carvedilol to 6.25 mg twice daily. -Call if you have questions about your medications.  NEXT APPOINTMENT: Return to clinic on 07/29/2022 with APP clinic.  In general, to take care of your heart failure: -Limit your fluid intake to 2 Liters (half-gallon) per day.   -Limit your salt intake to ideally 2-3 grams (2000-3000 mg) per day. -Weigh yourself daily and record, and bring that "weight diary" to your next appointment.  (Weight gain of 2-3 pounds in 1 day typically means fluid weight.) -The medications for your heart are to help your heart and help you live longer.   -Please contact us before stopping any of your heart medications.  Call the clinic at [phone] with questions or to reschedule future appointments.

## 2022-06-19 ENCOUNTER — Other Ambulatory Visit (HOSPITAL_COMMUNITY): Payer: Self-pay | Admitting: Cardiology

## 2022-06-25 ENCOUNTER — Telehealth: Payer: Self-pay

## 2022-06-25 DIAGNOSIS — Z23 Encounter for immunization: Secondary | ICD-10-CM | POA: Diagnosis not present

## 2022-06-25 NOTE — Telephone Encounter (Signed)
Pt is aware of the updated date/time for his procedure being moved from 07/23/22 to 07/24/22. Arrival time is 2:30pm. New letter with updated information has been sent via [place].  Cath lab is aware and updated.

## 2022-06-28 NOTE — Telephone Encounter (Signed)
LM for pt to call back. Per Venida Jarvis, he will have to be moved to a Wednesday or Friday.

## 2022-06-28 NOTE — Telephone Encounter (Signed)
Pt returned my call. His procedure has been moved from 11/22 to 11/28. Cath lab, precert and [facility]ning are all aware of this.  He is scheduled for a f/u with Dr. Raliegh Ip in [place] on 11/20 and will have labs done that day.

## 2022-07-11 NOTE — Telephone Encounter (Signed)
Pt is aware that his procedure has been moved from 11/28 @ 4:30 to 11/28 @ 1:30. He is aware of his arrival time being 11:30am.

## 2022-07-16 ENCOUNTER — Other Ambulatory Visit (HOSPITAL_COMMUNITY): Payer: Self-pay | Admitting: Cardiology

## 2022-07-16 DIAGNOSIS — I5022 Chronic systolic (congestive) heart failure: Secondary | ICD-10-CM | POA: Diagnosis not present

## 2022-07-16 DIAGNOSIS — Z01818 Encounter for other preprocedural examination: Secondary | ICD-10-CM | POA: Diagnosis not present

## 2022-07-17 LAB — BASIC METABOLIC PANEL
BUN/Creatinine Ratio: 13 (ref 10–24)
BUN: 13 mg/dL (ref 8–27)
CO2: 24 mmol/L (ref 20–29)
Calcium: 10.1 mg/dL (ref 8.6–10.2)
Chloride: 105 mmol/L (ref 96–106)
Creatinine, Ser: 1.01 mg/dL (ref 0.76–1.27)
Glucose: 63 mg/dL — ABNORMAL LOW (ref 70–99)
Potassium: 5.1 mmol/L (ref 3.5–5.2)
Sodium: 144 mmol/L (ref 134–144)
eGFR: 76 mL/min/{1.73_m2} (ref >59)

## 2022-07-17 LAB — CBC WITH DIFFERENTIAL/PLATELET
Basophils Absolute: 0 10*3/uL (ref 0.0–0.2)
Basos: 0 %
EOS (ABSOLUTE): 0.1 10*3/uL (ref 0.0–0.4)
Eos: 2 %
Hematocrit: 40.2 % (ref 37.5–51.0)
Hemoglobin: 13.7 g/dL (ref 13.0–17.7)
Immature Grans (Abs): 0 10*3/uL (ref 0.0–0.1)
Immature Granulocytes: 0 %
Lymphocytes Absolute: 1.6 10*3/uL (ref 0.7–3.1)
Lymphs: 22 %
MCH: 34 pg — ABNORMAL HIGH (ref 26.6–33.0)
MCHC: 34.1 g/dL (ref 31.5–35.7)
MCV: 100 fL — ABNORMAL HIGH (ref 79–97)
Monocytes Absolute: 0.8 10*3/uL (ref 0.1–0.9)
Monocytes: 11 %
Neutrophils Absolute: 4.7 10*3/uL (ref 1.4–7.0)
Neutrophils: 65 %
Platelets: 195 10*3/uL (ref 150–450)
RBC: 4.03 x10E6/uL — ABNORMAL LOW (ref 4.14–5.80)
RDW: 12.8 % (ref 11.6–15.4)
WBC: 7.3 10*3/uL (ref 3.4–10.8)

## 2022-07-22 ENCOUNTER — Ambulatory Visit: Payer: Medicare Other | Attending: Cardiology | Admitting: Cardiology

## 2022-07-22 ENCOUNTER — Encounter: Payer: Self-pay | Admitting: Cardiology

## 2022-07-22 VITALS — BP 148/72 | HR 66 | Ht 69.0 in | Wt 171.2 lb

## 2022-07-22 DIAGNOSIS — E785 Hyperlipidemia, unspecified: Secondary | ICD-10-CM

## 2022-07-22 DIAGNOSIS — Z951 Presence of aortocoronary bypass graft: Secondary | ICD-10-CM

## 2022-07-22 DIAGNOSIS — I10 Essential (primary) hypertension: Secondary | ICD-10-CM | POA: Diagnosis not present

## 2022-07-22 DIAGNOSIS — I255 Ischemic cardiomyopathy: Secondary | ICD-10-CM

## 2022-07-22 DIAGNOSIS — I493 Ventricular premature depolarization: Secondary | ICD-10-CM

## 2022-07-22 DIAGNOSIS — I5022 Chronic systolic (congestive) heart failure: Secondary | ICD-10-CM | POA: Diagnosis not present

## 2022-07-22 NOTE — Progress Notes (Signed)
Cardiology Office Note:    Date:  07/22/2022   ID:  Mannix, Kroeker Apr 06, 1941, MRN [id number]  PCP:  Paulina Fusi, MD  Cardiologist:  Gypsy Balsam, MD    Referring MD: Paulina Fusi, MD   Chief Complaint  Patient presents with   Follow-up    History of Present Illness:    Roger Smith is a 81 y.o. 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  I have I had an honor of taking care of his wife who required heart transplant and lived for more than 10 years after that.  Story is quite complicated: Had exertional CP and syncopal episode in church prompting evaluation by Cardiology in [place]. Echo at [facility] with EF 50% and inferior hypokinesis. Coronary calcium score 700. Subsequently had LHC which demonstrated 3 v CAD including LM disease. Echo at [facility] prior to CABG: EF < 20%, RV moderately reduced. He underwent CABG X 4 (LIMA to LAD, SVG to diagonal, left radial to OM, SVG to PDA) 01/23/22. Patient had expected POBLA and required transfusion. He did have atrial fibrillation and converted to NSR with IV amiodarone >> later switched to PO. PVCs also noted on tele. Diuresed with lasix and metolazone. Discharged on amiodarone 200 BID (plan to decrease 200 daily at f/u), Coreg 6.25 BID, Farxiga 10 mg daily, Furosemide 20 mg daily X 5 days then stop.    Seen in CT surgery 06/05 for suture removal. Became clammy and diaphoretic and had episode of decreased responsiveness. He was hypotensive with BP 79/54, improved to 113/64 on recheck. Coreg held for a day then given instructions to restart at lower dose 3.215 BID and stopped [place]. Diuretic discontinued. Amiodarone reduced to 200 mg daily. Had labs done, WBC elevated at 19K, Hgb 12.5, Scr 0.91, CO2 26, K 4.8, Na 137, glucose 107.  Started on abx for possible UTI.   Follow up 6/23 in [facility], had  syncopal episode, rapid response and code called. BP 90 on doppler, CBG 74. Given 1L NS and became more responsive. Evaluated by Dr. Shirlee Latch and transported to ED for further work up. Felt episode was secondary to hypotension and dehydration, however this was 2nd syncopal episode so Zio and LifeVest arranged.  Also found to have UTI and treated with Keflex. Echo showed EF 20-25%. Carvedilol and Imdur stopped during admission. Since seen him last time he did have implantation of ICD, however ventricle lead is not working properly he scheduled to have revision that will be done next week.  Overall he said he is feeling very good.  He denies have any chest pain tightness squeezing pressure burning chest no palpitation dizziness swelling of lower extremities he is very happy the way he feels.  Past Medical History:  Diagnosis Date   Chronic GERD    Coronary artery disease    Frequent PVCs    Hyperlipidemia    Myocardial infarction (HCC)    RBBB (right bundle branch block with left anterior fascicular block)    Syncope and collapse    TIA (transient ischemic attack)     Past Surgical History:  Procedure Laterality Date   CATARACT EXTRACTION Bilateral 2015   CORONARY ARTERY BYPASS GRAFT N/A 01/23/2022   Procedure: CORONARY ARTERY BYPASS GRAFTING (CABG) X4 USING LEFT INTERNAL MAMMARY ARTERY, LEFT RADIAL ARTERY, AND RIGHT GREATER SAPHENOUS VEIN HARVESTED ENDOSCOPICALLY;  Surgeon: Lovett Sox, MD;  Location:  [facility] OR;  Service: Open Heart Surgery;  Laterality: N/A;   ICD IMPLANT N/A 05/24/2022   Procedure: ICD IMPLANT;  Surgeon: Regan Lemming, MD;  Location: [facility] INVASIVE CV LAB;  Service: Cardiovascular;  Laterality: N/A;   INGUINAL HERNIA REPAIR Right 2005   INGUINAL HERNIA REPAIR Left 2017   Dr. Dimas Chyle   LEFT HEART CATH AND CORONARY ANGIOGRAPHY N/A 01/02/2022   Procedure: LEFT HEART CATH AND CORONARY ANGIOGRAPHY;  Surgeon: Kathleene Hazel, MD;  Location: [facility] INVASIVE CV LAB;  Service:  Cardiovascular;  Laterality: N/A;   RADIAL ARTERY HARVEST Left 01/23/2022   Procedure: RADIAL ARTERY HARVEST;  Surgeon: Lovett Sox, MD;  Location: [facility] OR;  Service: Open Heart Surgery;  Laterality: Left;   TEE WITHOUT CARDIOVERSION N/A 01/23/2022   Procedure: TRANSESOPHAGEAL ECHOCARDIOGRAM (TEE);  Surgeon: Lovett Sox, MD;  Location: [facility] OR;  Service: Open Heart Surgery;  Laterality: N/A;    Current Medications: Current Meds  Medication Sig   amiodarone (PACERONE) 200 MG tablet 200mg  BID x 1 week then 200mg  daily (Patient taking differently: Take 200 mg by mouth daily.)   aspirin EC 325 MG tablet Take 1 tablet (325 mg total) by mouth daily.   famotidine (PEPCID) 40 MG tablet Take 40 mg by mouth daily before breakfast.   multivitamin-lutein (OCUVITE-LUTEIN) CAPS capsule Take 1 capsule by mouth 2 (two) times daily.   Omega-3 Fatty Acids (FISH OIL) 1200 MG CAPS Take 1,200 mg by mouth daily.   rosuvastatin (CRESTOR) 10 MG tablet Take 10 mg by mouth at bedtime.   sacubitril-valsartan (ENTRESTO) 24-26 MG Take 1 tablet by mouth 2 (two) times daily.   spironolactone (ALDACTONE) 25 MG tablet Take 0.5 tablets (12.5 mg total) by mouth daily.   [DISCONTINUED] carvedilol (COREG) 6.25 MG tablet Take 1 tablet (6.25 mg total) by mouth 2 (two) times daily.     Allergies:   Patient has no known allergies.   Social History   Socioeconomic History   Marital status: Widowed    Spouse name: Not on file   Number of children: Not on file   Years of education: Not on file   Highest education level: Not on file  Occupational History   Not on file  Tobacco Use   Smoking status: Never   Smokeless tobacco: Never  Vaping Use   Vaping Use: Never used  Substance and Sexual Activity   Alcohol use: Never   Drug use: Never   Sexual activity: Not on file  Other Topics Concern   Not on file  Social History Narrative   Not on file   Social Determinants of Health   Financial Resource Strain: Not on  file  Food Insecurity: Not on file  Transportation Needs: No Transportation Needs (02/09/2022)   PRAPARE - (Medical): No    Lack of Transportation (Non-Medical): No  Physical Activity: Not on file  Stress: Not on file  Social Connections: Unknown (02/09/2022)   Social Connection and Isolation Panel [NHANES]    Frequency of Communication with Friends and Family: Twice a week    Frequency of Social Gatherings with Friends and Family: Twice a week    Attends Religious Services: Not on Administrator, Civil Service or Organizations: Yes    Attends 04/11/2022 Meetings: 1 to 4 times per year    Marital Status: Married     Family History: The patient's family history includes Heart attack in his mother; Heart disease  in his mother; Stomach cancer in his maternal grandfather. ROS:   Please see the history of present illness.    All 14 point review of systems negative except as described per history of present illness  EKGs/Labs/Other Studies Reviewed:      Recent Labs: 01/24/2022: ALT 13 04/04/2022: Magnesium 2.4 05/28/2022: B Natriuretic Peptide 135.5 07/16/2022: BUN 13; Creatinine, Ser 1.01; Hemoglobin 13.7; Platelets 195; Potassium 5.1; Sodium 144  Recent Lipid Panel    Component Value Date/Time   CHOL 118 05/28/2022 1449   TRIG 53 05/28/2022 1449   HDL 45 05/28/2022 1449   CHOLHDL 2.6 05/28/2022 1449   VLDL 11 05/28/2022 1449   LDLCALC 62 05/28/2022 1449    Physical Exam:    Bypass graft wound healed completely .   BP (!) 148/72 (BP Location: Left Arm, Patient Position: Sitting)   Pulse 66   Ht 5\' 9"  (1.753 m)   Wt 171 lb 3.2 oz (77.7 kg)   SpO2 98%   BMI 25.28 kg/m     Wt Readings from Last 3 Encounters:  07/22/22 171 lb 3.2 oz (77.7 kg)  06/18/22 169 lb 3.2 oz (76.7 kg)  06/14/22 166 lb (75.3 kg)     GEN:  Well nourished, well developed in no acute distress HEENT: Normal NECK: No JVD; No carotid  bruits LYMPHATICS: No lymphadenopathy CARDIAC: RRR, no murmurs, no rubs, no gallops RESPIRATORY:  Clear to auscultation without rales, wheezing or rhonchi  ABDOMEN: Soft, non-tender, non-distended MUSCULOSKELETAL:  No edema; No deformity  SKIN: Warm and dry LOWER EXTREMITIES: no swelling NEUROLOGIC:  Alert and oriented x 3 PSYCHIATRIC:  Normal affect   ASSESSMENT:    1. Ischemic cardiomyopathy ejection fraction 20%   2. Chronic systolic heart failure (HCC)   3. Ventricular ectopy   4. Essential hypertension   5. S/P CABG x 4   6. Dyslipidemia    PLAN:    In order of problems listed above:  Ischemic cardiomyopathy he is on guideline directed medical therapy blood pressure elevated in the office but he show me blood pressure measurements from home which are low.  I was trying to increase dose of either Entresto or carvedilol or also talk to him about adding SGLT2 inhibitors, however he said he does not want to do that right now because he is feeling so great.  We agree that after revision of his ventricle lead of ICD will revisit that topic. Congestive heart failure: Compensated continue present management Ventricular ectopy noted on the EKG.  He is on beta-blocker as well as amiodarone we will continue.  ICD in place.  No discharges.  Need to be revised dyslipidemia, I did review K PN which show me LDL 62 HDL 45 this is from May 28, 2022 he is on Crestor 10 which I will continue.   Medication Adjustments/Labs and Tests Ordered: Current medicines are reviewed at length with the patient today.  Concerns regarding medicines are outlined above.  No orders of the defined types were placed in this encounter.  Medication changes: No orders of the defined types were placed in this encounter.   Signed, May 30, 2022, MD, [facility] 07/22/2022 8:17 AM    [facility]

## 2022-07-22 NOTE — Patient Instructions (Signed)
Medication Instructions:  Your physician recommends that you continue on your current medications as directed. Please refer to the Current Medication list given to you today.  *If you need a refill on your cardiac medications before your next appointment, please call your pharmacy*   Lab Work: None Ordered If you have labs (blood work) drawn today and your tests are completely normal, you will receive your results only by: MyChart Message (if you have MyChart) OR A paper copy in the mail If you have any lab test that is abnormal or we need to change your treatment, we will call you to review the results.   Testing/Procedures: None Ordered   Follow-Up: At [facility], you and your health needs are our priority.  As part of our continuing mission to provide you with exceptional heart care, we have created designated Provider Care Teams.  These Care Teams include your primary Cardiologist (physician) and Advanced Practice Providers (APPs -  Physician Assistants and Nurse Practitioners) who all work together to provide you with the care you need, when you need it.  We recommend signing up for the patient portal called "MyChart".  Sign up information is provided on this After Visit Summary.  MyChart is used to connect with patients for Virtual Visits (Telemedicine).  Patients are able to view lab/test results, encounter notes, upcoming appointments, etc.  Non-urgent messages can be sent to your provider as well.   To learn more about what you can do with MyChart, go to [email or web address].    Your next appointment:   4 month(s)  The format for your next appointment:   In Person  Provider:   Robert Krasowski, MD    Other Instructions NA  

## 2022-07-29 ENCOUNTER — Encounter (HOSPITAL_COMMUNITY): Payer: Medicare Other

## 2022-07-29 NOTE — Pre-Procedure Instructions (Signed)
Attempted to call patient regarding procedure instructions.  Left voicemail on the following items: Arrival time 1100 Nothing to eat or drink after midnight No meds AM of procedure Responsible person to drive you home and stay with you for 24 hrs Wash with special soap night before and morning of procedure  

## 2022-07-30 ENCOUNTER — Other Ambulatory Visit: Payer: Self-pay

## 2022-07-30 ENCOUNTER — Ambulatory Visit (HOSPITAL_COMMUNITY)
Admission: RE | Admit: 2022-07-30 | Discharge: 2022-07-30 | Disposition: A | Discharge status: Home / Self Care | Payer: Medicare Other | Attending: Cardiology | Admitting: Cardiology

## 2022-07-30 ENCOUNTER — Ambulatory Visit (HOSPITAL_COMMUNITY): Payer: Medicare Other

## 2022-07-30 ENCOUNTER — Encounter (HOSPITAL_COMMUNITY)
Admission: RE | Disposition: A | Discharge status: Home / Self Care | Payer: Self-pay | Source: Home / Self Care | Attending: Cardiology

## 2022-07-30 DIAGNOSIS — Z95810 Presence of automatic (implantable) cardiac defibrillator: Secondary | ICD-10-CM | POA: Diagnosis not present

## 2022-07-30 DIAGNOSIS — T82120A Displacement of cardiac electrode, initial encounter: Secondary | ICD-10-CM | POA: Insufficient documentation

## 2022-07-30 DIAGNOSIS — I255 Ischemic cardiomyopathy: Secondary | ICD-10-CM | POA: Insufficient documentation

## 2022-07-30 DIAGNOSIS — I2510 Atherosclerotic heart disease of native coronary artery without angina pectoris: Secondary | ICD-10-CM | POA: Insufficient documentation

## 2022-07-30 DIAGNOSIS — T82110A Breakdown (mechanical) of cardiac electrode, initial encounter: Secondary | ICD-10-CM

## 2022-07-30 DIAGNOSIS — Z951 Presence of aortocoronary bypass graft: Secondary | ICD-10-CM | POA: Insufficient documentation

## 2022-07-30 DIAGNOSIS — Z9889 Other specified postprocedural states: Secondary | ICD-10-CM | POA: Diagnosis not present

## 2022-07-30 DIAGNOSIS — I252 Old myocardial infarction: Secondary | ICD-10-CM | POA: Diagnosis not present

## 2022-07-30 DIAGNOSIS — I4510 Unspecified right bundle-branch block: Secondary | ICD-10-CM | POA: Insufficient documentation

## 2022-07-30 DIAGNOSIS — Z8673 Personal history of transient ischemic attack (TIA), and cerebral infarction without residual deficits: Secondary | ICD-10-CM | POA: Insufficient documentation

## 2022-07-30 DIAGNOSIS — I5022 Chronic systolic (congestive) heart failure: Secondary | ICD-10-CM | POA: Diagnosis not present

## 2022-07-30 DIAGNOSIS — I110 Hypertensive heart disease with heart failure: Secondary | ICD-10-CM | POA: Insufficient documentation

## 2022-07-30 DIAGNOSIS — Y831 Surgical operation with implant of artificial internal device as the cause of abnormal reaction of the patient, or of later complication, without mention of misadventure at the time of the procedure: Secondary | ICD-10-CM | POA: Insufficient documentation

## 2022-07-30 DIAGNOSIS — E785 Hyperlipidemia, unspecified: Secondary | ICD-10-CM | POA: Insufficient documentation

## 2022-07-30 HISTORY — PX: LEAD REVISION/REPAIR: EP1213

## 2022-07-30 SURGERY — LEAD REVISION/REPAIR

## 2022-07-30 MED ORDER — CEFAZOLIN SODIUM-DEXTROSE 1-4 GM/50ML-% IV SOLN
1.0000 g | Freq: Four times a day (QID) | INTRAVENOUS | Status: DC
  Administered 2022-07-30: 1 g via INTRAVENOUS
  Filled 2022-07-30: qty 50

## 2022-07-30 MED ORDER — HEPARIN (PORCINE) IN NACL 1000-0.9 UT/500ML-% IV SOLN
INTRAVENOUS | Status: DC | PRN
  Administered 2022-07-30: 500 mL

## 2022-07-30 MED ORDER — CEFAZOLIN SODIUM-DEXTROSE 2-4 GM/100ML-% IV SOLN
2.0000 g | INTRAVENOUS | Status: AC
  Administered 2022-07-30: 2 g via INTRAVENOUS

## 2022-07-30 MED ORDER — POVIDONE-IODINE 10 % EX SWAB: 2.0000 | Freq: Once | CUTANEOUS | Status: DC

## 2022-07-30 MED ORDER — ONDANSETRON HCL 4 MG/2ML IJ SOLN: 4.0000 mg | Freq: Four times a day (QID) | INTRAMUSCULAR | Status: DC | PRN

## 2022-07-30 MED ORDER — CEFAZOLIN SODIUM-DEXTROSE 2-4 GM/100ML-% IV SOLN
INTRAVENOUS | Status: AC
  Filled 2022-07-30: qty 100

## 2022-07-30 MED ORDER — SODIUM CHLORIDE 0.9 % IV SOLN
INTRAVENOUS | Status: AC
  Filled 2022-07-30: qty 2

## 2022-07-30 MED ORDER — SODIUM CHLORIDE 0.9 % IV SOLN: INTRAVENOUS | Status: DC

## 2022-07-30 MED ORDER — ACETAMINOPHEN 325 MG PO TABS: 325.0000 mg | ORAL_TABLET | ORAL | Status: DC | PRN

## 2022-07-30 MED ORDER — SODIUM CHLORIDE 0.9 % IV SOLN
80.0000 mg | INTRAVENOUS | Status: AC
  Administered 2022-07-30: 80 mg

## 2022-07-30 MED ORDER — LIDOCAINE HCL (PF) 1 % IJ SOLN
INTRAMUSCULAR | Status: AC
  Filled 2022-07-30: qty 60

## 2022-07-30 MED ORDER — LIDOCAINE HCL (PF) 1 % IJ SOLN
INTRAMUSCULAR | Status: DC | PRN
  Administered 2022-07-30: 60 mL

## 2022-07-30 MED ORDER — HEPARIN (PORCINE) IN NACL 1000-0.9 UT/500ML-% IV SOLN
INTRAVENOUS | Status: AC
  Filled 2022-07-30: qty 500

## 2022-07-30 MED ORDER — CHLORHEXIDINE GLUCONATE 4 % EX LIQD: 4.0000 | Freq: Once | CUTANEOUS | Status: DC

## 2022-07-30 SURGICAL SUPPLY — 6 items
CABLE SURGICAL S-101-97-12 (CABLE) ×1 IMPLANT
PAD DEFIB RADIO PHYSIO CONN (PAD) ×1 IMPLANT
POUCH AIGIS-R ANTIBACT ICD (Mesh General) ×1 IMPLANT
POUCH AIGIS-R ANTIBACT ICD LRG (Mesh General) IMPLANT
SHEATH 8FR PRELUDE SNAP 13 (SHEATH) IMPLANT
TRAY PACEMAKER INSERTION (PACKS) ×1 IMPLANT

## 2022-07-30 NOTE — Progress Notes (Signed)
Report given off to [place], Charity fundraiser. Pt in stable condition denies any needs concerns at this time.

## 2022-07-30 NOTE — Discharge Instructions (Signed)
After Your Lead Revision  Do not lift your arm above shoulder height for 1 week after your procedure. After 7 days, you may progress as below.  You should remove your sling 24 hours after your procedure, unless otherwise instructed by your provider.     Tuesday August 06, 2022  Wednesday August 07, 2022 Thursday August 08, 2022 Friday August 09, 2022   Do not lift, push, pull, or carry anything over 10 pounds with the affected arm until 6 weeks (Tuesday September 10, 2022) after your procedure.   Do not drive until your wound check or until instructed by your healthcare provider that you are safe to do so.   Monitor your surgical site for redness, swelling, and drainage. Call the device clinic at [phone] if you experience these symptoms or fever/chills.  If your incision is sealed with Steri-strips or staples. You may shower 7 days after your procedure and wash your incision with soap and water as long as it is healed. If your incision is closed with Dermabond/Surgical glue. You may shower 1 day after your pacemaker implant and wash your incision with soap and water. Avoid lotions, ointments, or perfumes over your incision until it is well-healed.  You may use a hot tub or a pool AFTER your wound check appointment if the incision is completely closed.   Your cardiac device may be MRI compatible. We will discuss this at your office visit/Wound check  Remote monitoring is used to monitor your cardiac device from home. This monitoring is scheduled every 91 days by our office. It allows [place] to keep an eye on the functioning of your device to ensure it is working properly. You will routinely see your Electrophysiologist annually (more often if necessary).   Implantable Cardiac Device Lead Replacement, Care After This sheet gives you information about how to care for yourself after your procedure. Your health care provider may also give you more specific instructions. If you have problems or  questions, contact your health care provider. What can I expect after the procedure? After your procedure, it is common to have: Mild discomfort at the incision site. A small amount of drainage or bleeding at the incision site. This is usually no more than a spot. Follow these instructions at home: Incision care        Follow instructions from your health care provider about how to take care of your incision. Make sure you: Leave stitches (sutures), skin glue, or adhesive strips in place. These skin closures may need to stay in place for 2 weeks or longer. If adhesive strip edges start to loosen and curl up, you may trim the loose edges. Do not remove adhesive strips completely unless your health care provider tells you to do that. Check your incision area every day for signs of infection. Check for: More redness, swelling, or pain. More fluid or blood. Warmth. Pus or a bad smell. Electric and Engineer, building services cell phones should be kept 12 inches (30 cm) away from the cardiac device when they are on. When talking on a cell phone, use the ear on the opposite side of your cardiac device. Do not place a cell phone in a pocket next to the cardiac device. Household appliances do not interfere with modern-day cardiac device. Medicines Take over-the-counter and prescription medicines only as told by your health care provider. General instructions Do not raise the arm on the side of your procedure higher than your shoulder for at least 7 days. Except  for this restriction, continue to use your arm as normal to prevent problems. Do not take baths, swim, or use a hot tub until your health care provider says it is okay to do so. You may shower as directed by your health care provider. Do not lift anything that is heavier than 10 lb (4.5 kg) for 6 weeks or the limit that your health care provider tells you, until he or she says that it is safe. Return to your normal activities after 2 weeks, or  as told by your health care provider. Ask your health care provider what activities are safe for you. Keep all follow-up visits as told by your health care provider. This is important. Contact a health care provider if: You have more redness, swelling, or pain around your incision. You have more fluid or blood coming from your incision. Your incision feels warm to the touch. You have pus or a bad smell coming from your incision. You have a fever. The arm or hand on the side of the cardiac device becomes swollen. The symptoms you had before your procedure are not getting better. Get help right away if: You develop chest pain. You feel like you will faint. You feel light-headed. You faint. Summary Check your incision area every day for signs of infection, such as more fluid or blood. A small amount of drainage or bleeding at the incision site is normal. Do not raise the arm on the side of your procedure higher than your shoulder for at least 5 days, or as long as directed by your health care provider. Digital cell phones should be kept 12 inches (30 cm) away from the cardiac device when they are on. When talking on a cell phone, use the ear on the opposite side of your cardiac device. If the symptoms that led to having your lead replaced are not getting better, contact your health care provider. This information is not intended to replace advice given to you by your health care provider. Make sure you discuss any questions you have with your health care provider.

## 2022-07-30 NOTE — H&P (Signed)
Electrophysiology Office Note   Date:  07/30/2022   ID:  Remberto, Lienhard 1941/03/16, MRN [id number]  PCP:  Paulina Fusi, MD  Cardiologist:  Johnanna Schneiders Primary Electrophysiologist:  Regan Lemming, MD    Chief Complaint: CHF   History of Present Illness: Ahmeer Tuman is a 81 y.o. male who is being seen today for the evaluation of CHF at the request of No ref. provider found. Presenting today for electrophysiology evaluation.  He has a history significant for hypertension, hyperlipidemia, right bundle branch block, TIA.  He has exertional chest pain.  Left heart catheterization showed three-vessel coronary disease and he is now status post CABG x4 on 01/23/2022.  He had atrial fibrillation postoperatively but converted to sinus rhythm with amiodarone.  June 2023 he had an episode of syncope.  He was found to have low blood pressures.  He was transferred to the emergency room and was found to have an ejection fraction of 20 to 25%.  He is now status post [employer] ICD implanted 05/24/2022.  Unfortunately ventricular lead threshold has gone up significantly since implant.  Plan ICD lead revision today.   Past Medical History:  Diagnosis Date   Chronic GERD    Coronary artery disease    Frequent PVCs    Hyperlipidemia    Myocardial infarction (HCC)    RBBB (right bundle branch block with left anterior fascicular block)    Syncope and collapse    TIA (transient ischemic attack)    Past Surgical History:  Procedure Laterality Date   CATARACT EXTRACTION Bilateral 2015   CORONARY ARTERY BYPASS GRAFT N/A 01/23/2022   Procedure: CORONARY ARTERY BYPASS GRAFTING (CABG) X4 USING LEFT INTERNAL MAMMARY ARTERY, LEFT RADIAL ARTERY, AND RIGHT GREATER SAPHENOUS VEIN HARVESTED ENDOSCOPICALLY;  Surgeon: Lovett Sox, MD;  Location: [facility] OR;  Service: Open Heart Surgery;  Laterality: N/A;   ICD IMPLANT N/A 05/24/2022   Procedure: ICD IMPLANT;  Surgeon: Regan Lemming, MD;  Location: [facility] INVASIVE CV LAB;  Service: Cardiovascular;  Laterality: N/A;   INGUINAL HERNIA REPAIR Right 2005   INGUINAL HERNIA REPAIR Left 2017   Dr. Dimas Chyle   LEFT HEART CATH AND CORONARY ANGIOGRAPHY N/A 01/02/2022   Procedure: LEFT HEART CATH AND CORONARY ANGIOGRAPHY;  Surgeon: Kathleene Hazel, MD;  Location: [facility] INVASIVE CV LAB;  Service: Cardiovascular;  Laterality: N/A;   RADIAL ARTERY HARVEST Left 01/23/2022   Procedure: RADIAL ARTERY HARVEST;  Surgeon: Lovett Sox, MD;  Location: [facility] OR;  Service: Open Heart Surgery;  Laterality: Left;   TEE WITHOUT CARDIOVERSION N/A 01/23/2022   Procedure: TRANSESOPHAGEAL ECHOCARDIOGRAM (TEE);  Surgeon: Lovett Sox, MD;  Location: [facility] OR;  Service: Open Heart Surgery;  Laterality: N/A;     Current Facility-Administered Medications  Medication Dose Route Frequency Provider Last Rate Last Admin   0.9 %  sodium chloride infusion   Intravenous Continuous Regan Lemming, MD 50 mL/hr at 07/30/22 1209 New Bag at 07/30/22 1209   ceFAZolin (ANCEF) IVPB 2g/100 mL premix  2 g Intravenous On Call Shandel Busic Daphine Deutscher, MD       chlorhexidine (HIBICLENS) 4 % liquid 4 Application  4 Application Topical Once Isaiyah Feldhaus Daphine Deutscher, MD       gentamicin (GARAMYCIN) 80 mg in sodium chloride 0.9 % 500 mL irrigation  80 mg Irrigation On Call Torrence Branagan Daphine Deutscher, MD       povidone-iodine 10 % swab 2 Application  2 Application Topical Once Mara Favero, Andree Coss, MD  Allergies:   Patient has no known allergies.   Social History:  The patient  reports that he has never smoked. He has never used smokeless tobacco. He reports that he does not drink alcohol and does not use drugs.   Family History:  The patient's family history includes Heart attack in his mother; Heart disease in his mother; Stomach cancer in his maternal grandfather.   ROS:  Please see the history of present illness.   Otherwise, review of systems is positive for none.    All other systems are reviewed and negative.   PHYSICAL EXAM: VS:  BP (!) 145/72   Pulse 66   Temp 97.7 F (36.5 C) (Temporal)   Resp 17   Ht 5\' 9"  (1.753 m)   Wt 74.4 kg   SpO2 96%   BMI 24.22 kg/m  , BMI Body mass index is 24.22 kg/m. GEN: Well nourished, well developed, in no acute distress  HEENT: normal  Neck: no JVD, carotid bruits, or masses Cardiac: RRR; no murmurs, rubs, or gallops,no edema  Respiratory:  clear to auscultation bilaterally, normal work of breathing GI: soft, nontender, nondistended, + BS MS: no deformity or atrophy  Skin: warm and dry Neuro:  Strength and sensation are intact Psych: euthymic mood, full affect  Recent Labs: 01/24/2022: ALT 13 04/04/2022: Magnesium 2.4 05/28/2022: B Natriuretic Peptide 135.5 07/16/2022: BUN 13; Creatinine, Ser 1.01; Hemoglobin 13.7; Platelets 195; Potassium 5.1; Sodium 144    Lipid Panel     Component Value Date/Time   CHOL 118 05/28/2022 1449   TRIG 53 05/28/2022 1449   HDL 45 05/28/2022 1449   CHOLHDL 2.6 05/28/2022 1449   VLDL 11 05/28/2022 1449   LDLCALC 62 05/28/2022 1449     Wt Readings from Last 3 Encounters:  07/30/22 74.4 kg  07/22/22 77.7 kg  06/18/22 76.7 kg      Other studies Reviewed: Additional studies/ records that were reviewed today include: TTE 04/04/22  Review of the above records today demonstrates:   1. GLS -14.8. Left ventricular ejection fraction, by estimation, is 25 to  30%. The left ventricle has severely decreased function. The left  ventricle has no regional wall motion abnormalities. Left ventricular  diastolic parameters are consistent with  Grade I diastolic dysfunction (impaired relaxation).   2. Right ventricular systolic function is normal. The right ventricular  size is normal. There is normal pulmonary artery systolic pressure.   3. The mitral valve is normal in structure. Mild mitral valve  regurgitation. No evidence of mitral stenosis.   4. The aortic valve is normal  in structure. Aortic valve regurgitation is  trivial. Aortic valve sclerosis is present, with no evidence of aortic  valve stenosis.   5. The inferior vena cava is normal in size with greater than 50%  respiratory variability, suggesting right atrial pressure of 3 mmHg.   LHC 01/02/22   Prox RCA lesion is 99% stenosed.   Mid RCA lesion is 40% stenosed.   Ost Cx to Prox Cx lesion is 99% stenosed.   Mid LM to Dist LM lesion is 50% stenosed.   2nd Diag lesion is 60% stenosed.   Mid LAD lesion is 100% stenosed.   Ost LAD to Prox LAD lesion is 70% stenosed.  ASSESSMENT AND PLAN:  1.  Chronic systolic heart failure:  ICD Criteria  Current LVEF:33%. Within 12 months prior to implant: Yes   Heart failure history: Yes, Class II  Cardiomyopathy history: Yes, Ischemic Cardiomyopathy - Prior MI.  Atrial Fibrillation/Atrial Flutter: No.  Ventricular tachycardia history: No.  Cardiac arrest history: No.  History of syndromes with risk of sudden death: No.  Previous ICD: No.  Current ICD indication: Primary  PPM indication: No.  Class I or II Bradycardia indication present: No  Beta Blocker therapy for 3 or more months: Yes, prescribed.   Ace Inhibitor/ARB therapy for 3 or more months: Yes, prescribed.    I have seen Delrick Dehart is a 81 y.o. malepre-procedural and has been referred by [facility] for consideration of ICD implant for primary prevention of sudden death.  The patient's chart has been reviewed and they meet criteria for ICD implant.  I have had a thorough discussion with the patient reviewing options.  The patient and their family (if available) have had opportunities to ask questions and have them answered. The patient and I have decided together through the [facility] Heart Care Share Decision Support Tool to lead revsion ICD at this time.  Risks, benefits, alternatives to ICD implantation were discussed in detail with the patient today. The patient  understands that the risks  include but are not limited to bleeding, infection, pneumothorax, perforation, tamponade, vascular damage, renal failure, MI, stroke, death, inappropriate shocks, and lead dislodgement and wishes to proceed.

## 2022-07-31 ENCOUNTER — Encounter (HOSPITAL_COMMUNITY): Payer: Self-pay | Admitting: Cardiology

## 2022-08-04 DIAGNOSIS — J069 Acute upper respiratory infection, unspecified: Secondary | ICD-10-CM | POA: Diagnosis not present

## 2022-08-04 DIAGNOSIS — R059 Cough, unspecified: Secondary | ICD-10-CM | POA: Diagnosis not present

## 2022-08-04 DIAGNOSIS — Z20822 Contact with and (suspected) exposure to covid-19: Secondary | ICD-10-CM | POA: Diagnosis not present

## 2022-08-07 ENCOUNTER — Ambulatory Visit: Payer: Medicare Other | Attending: Interventional Cardiology

## 2022-08-07 DIAGNOSIS — I255 Ischemic cardiomyopathy: Secondary | ICD-10-CM

## 2022-08-07 LAB — CUP PACEART INCLINIC DEVICE CHECK
Date Time Interrogation Session: 20231206125753
HighPow Impedance: 64 Ohm
Implantable Lead Connection Status: 753985
Implantable Lead Implant Date: 20230922
Implantable Lead Location: 753860
Implantable Lead Model: 673
Implantable Lead Serial Number: 201030
Implantable Pulse Generator Implant Date: 20230922
Lead Channel Impedance Value: 738 Ohm
Lead Channel Pacing Threshold Amplitude: 1.3 V
Lead Channel Pacing Threshold Pulse Width: 1 ms
Lead Channel Sensing Intrinsic Amplitude: 10.1 mV
Lead Channel Setting Pacing Amplitude: 5 V
Lead Channel Setting Pacing Pulse Width: 1 ms
Lead Channel Setting Sensing Sensitivity: 0.5 mV
Pulse Gen Serial Number: 318429
Zone Setting Status: 755011

## 2022-08-07 NOTE — Patient Instructions (Signed)

## 2022-08-07 NOTE — Progress Notes (Signed)

## 2022-08-27 ENCOUNTER — Ambulatory Visit (INDEPENDENT_AMBULATORY_CARE_PROVIDER_SITE_OTHER): Payer: Medicare Other

## 2022-08-27 DIAGNOSIS — I255 Ischemic cardiomyopathy: Secondary | ICD-10-CM | POA: Diagnosis not present

## 2022-08-27 LAB — CUP PACEART REMOTE DEVICE CHECK
Battery Remaining Longevity: 174 mo
Battery Remaining Percentage: 100 %
Brady Statistic RV Percent Paced: 0 %
Date Time Interrogation Session: 20231226063700
HighPow Impedance: 60 Ohm
Implantable Lead Connection Status: 753985
Implantable Lead Implant Date: 20230922
Implantable Lead Location: 753860
Implantable Lead Model: 673
Implantable Lead Serial Number: 201030
Implantable Pulse Generator Implant Date: 20230922
Lead Channel Impedance Value: 722 Ohm
Lead Channel Setting Pacing Amplitude: 5 V
Lead Channel Setting Pacing Pulse Width: 1 ms
Lead Channel Setting Sensing Sensitivity: 0.5 mV
Pulse Gen Serial Number: 318429
Zone Setting Status: 755011

## 2022-09-09 ENCOUNTER — Encounter: Payer: Medicare Other | Admitting: Cardiology

## 2022-09-18 ENCOUNTER — Other Ambulatory Visit (HOSPITAL_COMMUNITY): Payer: Self-pay | Admitting: Family Medicine

## 2022-09-19 NOTE — Progress Notes (Signed)
Remote ICD transmission.   

## 2022-11-04 ENCOUNTER — Ambulatory Visit: Payer: Medicare Other | Attending: Cardiology | Admitting: Cardiology

## 2022-11-04 ENCOUNTER — Encounter: Payer: Self-pay | Admitting: Cardiology

## 2022-11-04 VITALS — BP 150/90 | HR 60 | Ht 69.0 in | Wt 172.0 lb

## 2022-11-04 DIAGNOSIS — I493 Ventricular premature depolarization: Secondary | ICD-10-CM | POA: Diagnosis not present

## 2022-11-04 DIAGNOSIS — I2510 Atherosclerotic heart disease of native coronary artery without angina pectoris: Secondary | ICD-10-CM | POA: Diagnosis not present

## 2022-11-04 DIAGNOSIS — I5022 Chronic systolic (congestive) heart failure: Secondary | ICD-10-CM

## 2022-11-04 DIAGNOSIS — I255 Ischemic cardiomyopathy: Secondary | ICD-10-CM

## 2022-11-04 LAB — CUP PACEART INCLINIC DEVICE CHECK
Date Time Interrogation Session: 20240304143114
HighPow Impedance: 68 Ohm
Implantable Lead Connection Status: 753985
Implantable Lead Implant Date: 20230922
Implantable Lead Location: 753860
Implantable Lead Model: 673
Implantable Lead Serial Number: 201030
Implantable Pulse Generator Implant Date: 20230922
Lead Channel Impedance Value: 660 Ohm
Lead Channel Pacing Threshold Amplitude: 1.4 V
Lead Channel Pacing Threshold Pulse Width: 1 ms
Lead Channel Sensing Intrinsic Amplitude: 16.9 mV
Lead Channel Setting Pacing Amplitude: 3 V
Lead Channel Setting Pacing Pulse Width: 1 ms
Lead Channel Setting Sensing Sensitivity: 0.5 mV
Pulse Gen Serial Number: 318429
Zone Setting Status: 755011

## 2022-11-04 NOTE — Progress Notes (Signed)
Electrophysiology Office Note   Date:  11/04/2022   ID:  Muhamed, Troost [birth year], MRN [id number]  PCP:  Nicoletta Dress, MD  Cardiologist:  Mayra Neer Primary Electrophysiologist:  Constance Haw, MD    Chief Complaint: CHF   History of Present Illness: Roger Smith is a 82 y.o. male who is being seen today for the evaluation of CHF at the request of Nicoletta Dress, MD. Presenting today for electrophysiology evaluation.  He has a history significant for hypertension, hyperlipidemia, right bundle branch block, TIA.  He had exertional chest pain with left heart catheterization showing three-vessel coronary disease.  He is post CABG x 4 on 01/23/2022.  He had atrial fibrillation postoperatively but converted to sinus rhythm with amiodarone.  June 2023 an episode of syncope.  He was found to have hypotension.  Ejection fraction was 20 to 25%.  He is post [place] implanted 05/24/2022.  Postop he developed an increased ventricular threshold and he is now status post lead revision.  Today, denies symptoms of palpitations, chest pain, shortness of breath, orthopnea, PND, lower extremity edema, claudication, dizziness, presyncope, syncope, bleeding, or neurologic sequela. The patient is tolerating medications without difficulties.      Past Medical History:  Diagnosis Date   Chronic GERD    Coronary artery disease    Frequent PVCs    Hyperlipidemia    Myocardial infarction (HCC)    RBBB (right bundle branch block with left anterior fascicular block)    Syncope and collapse    TIA (transient ischemic attack)    Past Surgical History:  Procedure Laterality Date   CATARACT EXTRACTION Bilateral 2015   CORONARY ARTERY BYPASS GRAFT N/A 01/23/2022   Procedure: CORONARY ARTERY BYPASS GRAFTING (CABG) X4 USING LEFT INTERNAL MAMMARY ARTERY, LEFT RADIAL ARTERY, AND RIGHT GREATER SAPHENOUS VEIN HARVESTED ENDOSCOPICALLY;  Surgeon: Dahlia Byes, MD;  Location:  [place];  Service: Open Heart Surgery;  Laterality: N/A;   ICD IMPLANT N/A 05/24/2022   Procedure: ICD IMPLANT;  Surgeon: Constance Haw, MD;  Location: [place] CV LAB;  Service: Cardiovascular;  Laterality: N/A;   INGUINAL HERNIA REPAIR Right 2005   INGUINAL HERNIA REPAIR Left 2017   Dr. Pauletta Browns   LEAD REVISION/REPAIR N/A 07/30/2022   Procedure: LEAD REVISION/REPAIR;  Surgeon: Constance Haw, MD;  Location: [place] CV LAB;  Service: Cardiovascular;  Laterality: N/A;   LEFT HEART CATH AND CORONARY ANGIOGRAPHY N/A 01/02/2022   Procedure: LEFT HEART CATH AND CORONARY ANGIOGRAPHY;  Surgeon: Burnell Blanks, MD;  Location: [place] CV LAB;  Service: Cardiovascular;  Laterality: N/A;   RADIAL ARTERY HARVEST Left 01/23/2022   Procedure: RADIAL ARTERY HARVEST;  Surgeon: Dahlia Byes, MD;  Location: [place];  Service: Open Heart Surgery;  Laterality: Left;   TEE WITHOUT CARDIOVERSION N/A 01/23/2022   Procedure: TRANSESOPHAGEAL ECHOCARDIOGRAM (TEE);  Surgeon: Dahlia Byes, MD;  Location: [place];  Service: Open Heart Surgery;  Laterality: N/A;     Current Outpatient Medications  Medication Sig Dispense Refill   amiodarone (PACERONE) 200 MG tablet '200mg'$  BID x 1 week then '200mg'$  daily (Patient taking differently: Take 200 mg by mouth daily.) 90 tablet 3   aspirin EC 325 MG tablet Take 1 tablet (325 mg total) by mouth daily.     carvedilol (COREG) 6.25 MG tablet Take 6.25 mg by mouth 2 (two) times daily with a meal.     multivitamin-lutein (OCUVITE-LUTEIN) CAPS capsule Take 1 capsule by mouth 2 (two) times  daily.     Omega-3 Fatty Acids (FISH OIL) 1200 MG CAPS Take 1,200 mg by mouth daily.     rosuvastatin (CRESTOR) 10 MG tablet Take 10 mg by mouth at bedtime.     sacubitril-valsartan (ENTRESTO) 24-26 MG Take 1 tablet by mouth 2 (two) times daily. 60 tablet 11   spironolactone (ALDACTONE) 25 MG tablet TAKE 1/2 TABLET BY MOUTH ONCE DAILY 45 tablet 0   famotidine (PEPCID) 40 MG  tablet Take 40 mg by mouth daily before breakfast. (Patient not taking: Reported on 11/04/2022)     No current facility-administered medications for this visit.    Allergies:   Patient has no known allergies.   Social History:  The patient  reports that he has never smoked. He has never used smokeless tobacco. He reports that he does not drink alcohol and does not use drugs.   Family History:  The patient's family history includes Heart attack in his mother; Heart disease in his mother; Stomach cancer in his maternal grandfather.   ROS:  Please see the history of present illness.   Otherwise, review of systems is positive for none.   All other systems are reviewed and negative.   PHYSICAL EXAM: VS:  BP (!) 150/90   Pulse 60   Ht '5\' 9"'$  (1.753 m)   Wt 172 lb (78 kg)   SpO2 99%   BMI 25.40 kg/m  , BMI Body mass index is 25.4 kg/m. GEN: Well nourished, well developed, in no acute distress  HEENT: normal  Neck: no JVD, carotid bruits, or masses Cardiac: RRR; no murmurs, rubs, or gallops,no edema  Respiratory:  clear to auscultation bilaterally, normal work of breathing GI: soft, nontender, nondistended, + BS MS: no deformity or atrophy  Skin: warm and dry, device site well healed Neuro:  Strength and sensation are intact Psych: euthymic mood, full affect  EKG:  EKG is ordered today. Personal review of the ekg ordered shows sinus rhythm, RBBB  Personal review of the device interrogation today. Results in [place]: 01/24/2022: ALT 13 04/04/2022: Magnesium 2.4 05/28/2022: B Natriuretic Peptide 135.5 07/16/2022: BUN 13; Creatinine, Ser 1.01; Hemoglobin 13.7; Platelets 195; Potassium 5.1; Sodium 144    Lipid Panel     Component Value Date/Time   CHOL 118 05/28/2022 1449   TRIG 53 05/28/2022 1449   HDL 45 05/28/2022 1449   CHOLHDL 2.6 05/28/2022 1449   VLDL 11 05/28/2022 1449   LDLCALC 62 05/28/2022 1449     Wt Readings from Last 3 Encounters:  11/04/22 172 lb  (78 kg)  07/30/22 164 lb (74.4 kg)  07/22/22 171 lb 3.2 oz (77.7 kg)      Other studies Reviewed: Additional studies/ records that were reviewed today include: TTE 04/04/22  Review of the above records today demonstrates:   1. GLS -14.8. Left ventricular ejection fraction, by estimation, is 25 to  30%. The left ventricle has severely decreased function. The left  ventricle has no regional wall motion abnormalities. Left ventricular  diastolic parameters are consistent with  Grade I diastolic dysfunction (impaired relaxation).   2. Right ventricular systolic function is normal. The right ventricular  size is normal. There is normal pulmonary artery systolic pressure.   3. The mitral valve is normal in structure. Mild mitral valve  regurgitation. No evidence of mitral stenosis.   4. The aortic valve is normal in structure. Aortic valve regurgitation is  trivial. Aortic valve sclerosis is present, with no evidence of aortic  valve stenosis.   5. The inferior vena cava is normal in size with greater than 50%  respiratory variability, suggesting right atrial pressure of 3 mmHg.   LHC 01/02/22   Prox RCA lesion is 99% stenosed.   Mid RCA lesion is 40% stenosed.   Ost Cx to Prox Cx lesion is 99% stenosed.   Mid LM to Dist LM lesion is 50% stenosed.   2nd Diag lesion is 60% stenosed.   Mid LAD lesion is 100% stenosed.   Ost LAD to Prox LAD lesion is 70% stenosed.  ASSESSMENT AND PLAN:  1.  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  Currently on optimal medical therapy per primary cardiology.  Status post [employer] ICD implanted 05/24/2022.  He had a significant increase in RV lead threshold and drop in impedance.  He had lead revision 07/30/2022.  Impedance, threshold, sensing within normal limits.  No changes.  2.  Coronary artery disease: Status post CABG x 4.  No current chest pain.  3.  Atrial fibrillation: Occurred postop.  In sinus rhythm.  No  changes.  4.  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  Continue with current management.    Current medicines are reviewed at length with the patient today.   The patient does not have concerns regarding his medicines.  The following changes were made today: none  Labs/ tests ordered today include:  Orders Placed This Encounter  Procedures   CUP [place]   EKG 12-Lead     Disposition:   FU 12 months  Signed, Livia Tarr Meredith Leeds, MD  11/04/2022 2:38 PM     [place] [address] [place] [address] [phone] (office) [phone] (fax)

## 2022-11-04 NOTE — Patient Instructions (Signed)
Medication Instructions:  Your physician recommends that you continue on your current medications as directed. Please refer to the Current Medication list given to you today.  *If you need a refill on your cardiac medications before your next appointment, please call your pharmacy*   Lab Work: None ordered   Testing/Procedures: None ordered   Follow-Up: At [facility], you and your health needs are our priority.  As part of our continuing mission to provide you with exceptional heart care, we have created designated Provider Care Teams.  These Care Teams include your primary Cardiologist (physician) and Advanced Practice Providers (APPs -  Physician Assistants and Nurse Practitioners) who all work together to provide you with the care you need, when you need it.  Remote monitoring is used to monitor your Pacemaker or ICD from home. This monitoring reduces the number of office visits required to check your device to one time per year. It allows [place] to keep an eye on the functioning of your device to ensure it is working properly. You are scheduled for a device check from home on 11/26/2022. You may send your transmission at any time that day. If you have a wireless device, the transmission will be sent automatically. After your physician reviews your transmission, you will receive a postcard with your next transmission date.  Your next appointment:   1 year(s)  The format for your next appointment:   In Person  Provider:   Allegra Lai, MD    Thank you for choosing [facility]!!   Trinidad Curet, RN [phone]

## 2022-11-05 DIAGNOSIS — H524 Presbyopia: Secondary | ICD-10-CM | POA: Diagnosis not present

## 2022-11-26 ENCOUNTER — Ambulatory Visit (INDEPENDENT_AMBULATORY_CARE_PROVIDER_SITE_OTHER): Payer: Medicare Other

## 2022-11-26 DIAGNOSIS — I255 Ischemic cardiomyopathy: Secondary | ICD-10-CM

## 2022-11-26 LAB — CUP PACEART REMOTE DEVICE CHECK
Battery Remaining Longevity: 174 mo
Battery Remaining Percentage: 100 %
Brady Statistic RV Percent Paced: 0 %
Date Time Interrogation Session: 20240326065100
HighPow Impedance: 61 Ohm
Implantable Lead Connection Status: 753985
Implantable Lead Implant Date: 20230922
Implantable Lead Location: 753860
Implantable Lead Model: 673
Implantable Lead Serial Number: 201030
Implantable Pulse Generator Implant Date: 20230922
Lead Channel Impedance Value: 655 Ohm
Lead Channel Setting Pacing Amplitude: 3 V
Lead Channel Setting Pacing Pulse Width: 1 ms
Lead Channel Setting Sensing Sensitivity: 0.5 mV
Pulse Gen Serial Number: 318429
Zone Setting Status: 755011

## 2022-11-29 ENCOUNTER — Encounter: Payer: Self-pay | Admitting: Cardiology

## 2022-11-29 ENCOUNTER — Ambulatory Visit: Payer: Medicare Other | Attending: Cardiology | Admitting: Cardiology

## 2022-11-29 VITALS — BP 150/100 | HR 60 | Ht 69.0 in | Wt 171.0 lb

## 2022-11-29 DIAGNOSIS — I10 Essential (primary) hypertension: Secondary | ICD-10-CM

## 2022-11-29 DIAGNOSIS — Z951 Presence of aortocoronary bypass graft: Secondary | ICD-10-CM

## 2022-11-29 DIAGNOSIS — E785 Hyperlipidemia, unspecified: Secondary | ICD-10-CM | POA: Diagnosis not present

## 2022-11-29 DIAGNOSIS — I255 Ischemic cardiomyopathy: Secondary | ICD-10-CM

## 2022-11-29 NOTE — Addendum Note (Signed)
Addended by: Jacobo Forest D on: 11/29/2022 08:55 AM   Modules accepted: Orders

## 2022-11-29 NOTE — Patient Instructions (Signed)

## 2022-11-29 NOTE — Progress Notes (Signed)
Cardiology Office Note:    Date:  11/29/2022   ID:  Roger Smith, Roger Smith 04-Jun-1941, MRN [id number]  PCP:  Nicoletta Dress, MD  Cardiologist:  Jenne Campus, MD    Referring MD: Nicoletta Dress, MD   Chief Complaint  Patient presents with   Follow-up  Doing very well  History of Present Illness:    Roger Smith is a 82 y.o. 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  I have I had an honor of taking care of his wife who required heart transplant and lived for more than 10 years after that.  Story is quite complicated: Had exertional CP and syncopal episode in church prompting evaluation by Cardiology in [place]. Echo at [facility] with EF 50% and inferior hypokinesis. Coronary calcium score 700. Subsequently had LHC which demonstrated 3 v CAD including LM disease. Echo at [facility] prior to CABG: EF < 20%, RV moderately reduced. He underwent CABG X 4 (LIMA to LAD, SVG to diagonal, left radial to OM, SVG to PDA) 01/23/22. Patient had expected POBLA and required transfusion. He did have atrial fibrillation and converted to NSR with IV amiodarone >> later switched to PO. PVCs also noted on tele. Diuresed with lasix and metolazone. Discharged on amiodarone 200 BID (plan to decrease 200 daily at f/u), Coreg 6.25 BID, Farxiga 10 mg daily, Furosemide 20 mg daily X 5 days then stop.    Seen in CT surgery 06/05 for suture removal. Became clammy and diaphoretic and had episode of decreased responsiveness. He was hypotensive with BP 79/54, improved to 113/64 on recheck. Coreg held for a day then given instructions to restart at lower dose 3.215 BID and stopped [place]. Diuretic discontinued. Amiodarone reduced to 200 mg daily. Had labs done, WBC elevated at 19K, Hgb 12.5, Scr 0.91, CO2 26, K 4.8, Na 137, glucose 107.  Started on abx for possible UTI.   Follow up 6/23  in [facility], had syncopal episode, rapid response and code called. BP 90 on doppler, CBG 74. Given 1L NS and became more responsive. Evaluated by Dr. Aundra Dubin and transported to ED for further work up. Felt episode was secondary to hypotension and dehydration, however this was 2nd syncopal episode so Zio and LifeVest arranged.  Also found to have UTI and treated with Keflex. Echo showed EF 20-25%. Carvedilol and Imdur stopped during admission. Since that time he did have ICD implanted however there was high threshold to ventricle channel, recently that being revised and seems to be doing quite well. Comes today to months for follow-up.  Overall doing very well.  Denies of any chest pain tightness squeezing pressure mentions very happy the way he feels the only complaint he has is the fact that he can feel cold all the time  Past Medical History:  Diagnosis Date   Chronic GERD    Coronary artery disease    Frequent PVCs    Hyperlipidemia    Myocardial infarction ([place])    RBBB (right bundle branch block with left anterior fascicular block)    Syncope and collapse    TIA (transient ischemic attack)     Past Surgical History:  Procedure Laterality Date   CATARACT EXTRACTION Bilateral 2015   CORONARY ARTERY BYPASS GRAFT N/A 01/23/2022   Procedure: CORONARY ARTERY BYPASS GRAFTING (CABG) X4 USING LEFT INTERNAL MAMMARY ARTERY, LEFT RADIAL ARTERY, AND RIGHT GREATER SAPHENOUS VEIN HARVESTED  ENDOSCOPICALLY;  Surgeon: Dahlia Byes, MD;  Location: [place];  Service: Open Heart Surgery;  Laterality: N/A;   ICD IMPLANT N/A 05/24/2022   Procedure: ICD IMPLANT;  Surgeon: Constance Haw, MD;  Location: [place] CV LAB;  Service: Cardiovascular;  Laterality: N/A;   INGUINAL HERNIA REPAIR Right 2005   INGUINAL HERNIA REPAIR Left 2017   Dr. Pauletta Browns   LEAD REVISION/REPAIR N/A 07/30/2022   Procedure: LEAD REVISION/REPAIR;  Surgeon: Constance Haw, MD;  Location: [place] CV LAB;  Service:  Cardiovascular;  Laterality: N/A;   LEFT HEART CATH AND CORONARY ANGIOGRAPHY N/A 01/02/2022   Procedure: LEFT HEART CATH AND CORONARY ANGIOGRAPHY;  Surgeon: Burnell Blanks, MD;  Location: [place] CV LAB;  Service: Cardiovascular;  Laterality: N/A;   RADIAL ARTERY HARVEST Left 01/23/2022   Procedure: RADIAL ARTERY HARVEST;  Surgeon: Dahlia Byes, MD;  Location: [place];  Service: Open Heart Surgery;  Laterality: Left;   TEE WITHOUT CARDIOVERSION N/A 01/23/2022   Procedure: TRANSESOPHAGEAL ECHOCARDIOGRAM (TEE);  Surgeon: Dahlia Byes, MD;  Location: [place];  Service: Open Heart Surgery;  Laterality: N/A;    Current Medications: Current Meds  Medication Sig   amiodarone (PACERONE) 200 MG tablet Take 200 mg by mouth daily.   aspirin EC 325 MG tablet Take 1 tablet (325 mg total) by mouth daily.   carvedilol (COREG) 6.25 MG tablet Take 6.25 mg by mouth 2 (two) times daily with a meal.   multivitamin-lutein (OCUVITE-LUTEIN) CAPS capsule Take 1 capsule by mouth 2 (two) times daily.   Omega-3 Fatty Acids (FISH OIL) 1200 MG CAPS Take 1,200 mg by mouth daily.   rosuvastatin (CRESTOR) 10 MG tablet Take 10 mg by mouth at bedtime.   sacubitril-valsartan (ENTRESTO) 24-26 MG Take 1 tablet by mouth 2 (two) times daily.   spironolactone (ALDACTONE) 25 MG tablet TAKE 1/2 TABLET BY MOUTH ONCE DAILY     Allergies:   Patient has no known allergies.   Social History   Socioeconomic History   Marital status: Widowed    Spouse name: Not on file   Number of children: Not on file   Years of education: Not on file   Highest education level: Not on file  Occupational History   Not on file  Tobacco Use   Smoking status: Never   Smokeless tobacco: Never  Vaping Use   Vaping Use: Never used  Substance and Sexual Activity   Alcohol use: Never   Drug use: Never   Sexual activity: Not on file  Other Topics Concern   Not on file  Social History Narrative   Not on file   Social Determinants of  Health   Financial Resource Strain: Not on file  Food Insecurity: Not on file  Transportation Needs: No Transportation Needs (02/09/2022)   PRAPARE - Hydrologist (Medical): No    Lack of Transportation (Non-Medical): No  Physical Activity: Not on file  Stress: Not on file  Social Connections: Unknown (02/09/2022)   Social Connection and Isolation Panel [NHANES]    Frequency of Communication with Friends and Family: Twice a week    Frequency of Social Gatherings with Friends and Family: Twice a week    Attends Religious Services: Not on Advertising copywriter or Organizations: Yes    Attends Archivist Meetings: 1 to 4 times per year    Marital Status: Married     Family History: The patient's family history  includes Heart attack in his mother; Heart disease in his mother; Stomach cancer in his maternal grandfather. ROS:   Please see the history of present illness.    All 14 point review of systems negative except as described per history of present illness  EKGs/Labs/Other Studies Reviewed:      Recent Labs: 01/24/2022: ALT 13 04/04/2022: Magnesium 2.4 05/28/2022: B Natriuretic Peptide 135.5 07/16/2022: BUN 13; Creatinine, Ser 1.01; Hemoglobin 13.7; Platelets 195; Potassium 5.1; Sodium 144  Recent Lipid Panel    Component Value Date/Time   CHOL 118 05/28/2022 1449   TRIG 53 05/28/2022 1449   HDL 45 05/28/2022 1449   CHOLHDL 2.6 05/28/2022 1449   VLDL 11 05/28/2022 1449   LDLCALC 62 05/28/2022 1449    Physical Exam:    VS:  BP (!) 150/100 (BP Location: Left Arm, Patient Position: Sitting, Cuff Size: Normal)   Pulse 60   Ht 5\' 9"  (1.753 m)   Wt 171 lb (77.6 kg)   SpO2 97%   BMI 25.25 kg/m     Wt Readings from Last 3 Encounters:  11/29/22 171 lb (77.6 kg)  11/04/22 172 lb (78 kg)  07/30/22 164 lb (74.4 kg)     GEN:  Well nourished, well developed in no acute distress HEENT: Normal NECK: No JVD; No carotid  bruits LYMPHATICS: No lymphadenopathy CARDIAC: RRR, no murmurs, no rubs, no gallops RESPIRATORY:  Clear to auscultation without rales, wheezing or rhonchi  ABDOMEN: Soft, non-tender, non-distended MUSCULOSKELETAL:  No edema; No deformity  SKIN: Warm and dry LOWER EXTREMITIES: no swelling NEUROLOGIC:  Alert and oriented x 3 PSYCHIATRIC:  Normal affect   ASSESSMENT:    1. S/P CABG x 4   2. Essential hypertension   3. Ischemic cardiomyopathy ejection fraction 20%   4. Dyslipidemia    PLAN:    In order of problems listed above:  Status post coronary artery bypass grafting well from that point review continue present management. Essential hypertension blood pressure elevated but he said he did not take his medications today I stressed importance of taking medication on the regular basis. Ischemic cardiomyopathy ejection fraction 20 to 25% last time I wanted to upgrade his medical therapy he did not want to do it because he feels a good I will recheck his echocardiogram to recheck left ventricle ejection fraction. ICD present, recent revision of the ventricular lead.  Doing well.  Arm is a stable. Dyslipidemia I did review K PN which show me his LDL of 62 HDL 45 will continue present management which includes moderate intensity statin in form of Crestor 10.   Medication Adjustments/Labs and Tests Ordered: Current medicines are reviewed at length with the patient today.  Concerns regarding medicines are outlined above.  No orders of the defined types were placed in this encounter.  Medication changes: No orders of the defined types were placed in this encounter.   Signed, Park Liter, MD, [facility] 11/29/2022 8:51 AM    [place]

## 2022-12-11 ENCOUNTER — Ambulatory Visit: Payer: Medicare Other | Attending: Cardiology

## 2022-12-11 DIAGNOSIS — I255 Ischemic cardiomyopathy: Secondary | ICD-10-CM | POA: Diagnosis not present

## 2022-12-11 LAB — ECHOCARDIOGRAM COMPLETE
Calc EF: 41.3 %
S' Lateral: 3.8 cm
Single Plane A2C EF: 45.1 %
Single Plane A4C EF: 42.4 %

## 2022-12-14 ENCOUNTER — Other Ambulatory Visit: Payer: Self-pay | Admitting: Cardiology

## 2022-12-16 NOTE — Telephone Encounter (Signed)
Rx refill sent to pharmacy. 

## 2022-12-19 ENCOUNTER — Other Ambulatory Visit (HOSPITAL_COMMUNITY): Payer: Self-pay | Admitting: Family Medicine

## 2022-12-23 ENCOUNTER — Telehealth: Payer: Self-pay | Admitting: Cardiology

## 2022-12-23 NOTE — Telephone Encounter (Signed)
Patient is calling to follow up on echo results from 04/10. Please advise

## 2022-12-23 NOTE — Telephone Encounter (Signed)
Results reviewed with pt as per Dr. Vanetta Shawl note.  Pt verbalized understanding and had no additional questions. Routed to PCP   Echocardiogram significantly improved now 45-50 which is only mildly diminished, mild to moderate mitral valve regurgitation, left atrial enlargement mild to moderately.  All of this medical therapy

## 2023-01-07 NOTE — Progress Notes (Signed)
Remote ICD transmission.   

## 2023-02-25 ENCOUNTER — Ambulatory Visit (INDEPENDENT_AMBULATORY_CARE_PROVIDER_SITE_OTHER): Payer: Medicare Other

## 2023-02-25 DIAGNOSIS — I255 Ischemic cardiomyopathy: Secondary | ICD-10-CM

## 2023-02-26 LAB — CUP PACEART REMOTE DEVICE CHECK
Battery Remaining Longevity: 180 mo
Battery Remaining Percentage: 100 %
Brady Statistic RV Percent Paced: 0 %
Date Time Interrogation Session: 20240625071600
HighPow Impedance: 63 Ohm
Implantable Lead Connection Status: 753985
Implantable Lead Implant Date: 20230922
Implantable Lead Location: 753860
Implantable Lead Model: 673
Implantable Lead Serial Number: 201030
Implantable Pulse Generator Implant Date: 20230922
Lead Channel Impedance Value: 615 Ohm
Lead Channel Setting Pacing Amplitude: 3 V
Lead Channel Setting Pacing Pulse Width: 1 ms
Lead Channel Setting Sensing Sensitivity: 0.5 mV
Pulse Gen Serial Number: 318429
Zone Setting Status: 755011

## 2023-03-10 DIAGNOSIS — I480 Paroxysmal atrial fibrillation: Secondary | ICD-10-CM | POA: Diagnosis not present

## 2023-03-10 DIAGNOSIS — I25708 Atherosclerosis of coronary artery bypass graft(s), unspecified, with other forms of angina pectoris: Secondary | ICD-10-CM | POA: Diagnosis not present

## 2023-03-10 DIAGNOSIS — E785 Hyperlipidemia, unspecified: Secondary | ICD-10-CM | POA: Diagnosis not present

## 2023-03-10 DIAGNOSIS — I119 Hypertensive heart disease without heart failure: Secondary | ICD-10-CM | POA: Diagnosis not present

## 2023-03-10 DIAGNOSIS — Z79899 Other long term (current) drug therapy: Secondary | ICD-10-CM | POA: Diagnosis not present

## 2023-03-10 DIAGNOSIS — Z125 Encounter for screening for malignant neoplasm of prostate: Secondary | ICD-10-CM | POA: Diagnosis not present

## 2023-03-11 DIAGNOSIS — E875 Hyperkalemia: Secondary | ICD-10-CM | POA: Diagnosis not present

## 2023-03-19 NOTE — Progress Notes (Signed)
 Remote ICD transmission.   

## 2023-05-15 DIAGNOSIS — H353132 Nonexudative age-related macular degeneration, bilateral, intermediate dry stage: Secondary | ICD-10-CM | POA: Diagnosis not present

## 2023-05-26 ENCOUNTER — Other Ambulatory Visit (HOSPITAL_COMMUNITY): Payer: Self-pay | Admitting: Cardiology

## 2023-05-27 ENCOUNTER — Ambulatory Visit (INDEPENDENT_AMBULATORY_CARE_PROVIDER_SITE_OTHER): Payer: Medicare Other

## 2023-05-27 DIAGNOSIS — I5022 Chronic systolic (congestive) heart failure: Secondary | ICD-10-CM | POA: Diagnosis not present

## 2023-05-27 DIAGNOSIS — I255 Ischemic cardiomyopathy: Secondary | ICD-10-CM

## 2023-05-28 LAB — CUP PACEART REMOTE DEVICE CHECK
Battery Remaining Longevity: 162 mo
Battery Remaining Percentage: 100 %
Brady Statistic RV Percent Paced: 0 %
Date Time Interrogation Session: 20240925132200
HighPow Impedance: 63 Ohm
Implantable Lead Connection Status: 753985
Implantable Lead Implant Date: 20230922
Implantable Lead Location: 753860
Implantable Lead Model: 673
Implantable Lead Serial Number: 201030
Implantable Pulse Generator Implant Date: 20230922
Lead Channel Impedance Value: 621 Ohm
Lead Channel Setting Pacing Amplitude: 3 V
Lead Channel Setting Pacing Pulse Width: 1 ms
Lead Channel Setting Sensing Sensitivity: 0.5 mV
Pulse Gen Serial Number: 318429
Zone Setting Status: 755011

## 2023-06-12 ENCOUNTER — Other Ambulatory Visit: Payer: Self-pay | Admitting: Cardiology

## 2023-06-12 NOTE — Telephone Encounter (Signed)
Rx refill sent to pharmacy. 

## 2023-06-12 NOTE — Progress Notes (Signed)
Remote ICD transmission.   

## 2023-06-17 ENCOUNTER — Ambulatory Visit: Payer: Medicare Other | Attending: Cardiology | Admitting: Cardiology

## 2023-06-17 ENCOUNTER — Encounter: Payer: Self-pay | Admitting: Cardiology

## 2023-06-17 ENCOUNTER — Other Ambulatory Visit (HOSPITAL_COMMUNITY): Payer: Self-pay | Admitting: Cardiology

## 2023-06-17 VITALS — BP 136/80 | HR 72 | Ht 69.0 in | Wt 169.8 lb

## 2023-06-17 DIAGNOSIS — I255 Ischemic cardiomyopathy: Secondary | ICD-10-CM | POA: Diagnosis not present

## 2023-06-17 DIAGNOSIS — E785 Hyperlipidemia, unspecified: Secondary | ICD-10-CM

## 2023-06-17 DIAGNOSIS — I10 Essential (primary) hypertension: Secondary | ICD-10-CM | POA: Diagnosis not present

## 2023-06-17 DIAGNOSIS — Z951 Presence of aortocoronary bypass graft: Secondary | ICD-10-CM

## 2023-06-17 MED ORDER — ENTRESTO 24-26 MG PO TABS: 1.0000 | ORAL_TABLET | Freq: Two times a day (BID) | ORAL | 6 refills | Status: DC

## 2023-06-17 MED ORDER — ENTRESTO 24-26 MG PO TABS: 1.0000 | ORAL_TABLET | Freq: Two times a day (BID) | ORAL | 0 refills | Status: DC

## 2023-06-17 NOTE — Addendum Note (Signed)
Addended by: Heywood Bene on: 06/17/2023 02:20 PM   Modules accepted: Orders

## 2023-06-17 NOTE — Addendum Note (Signed)
Addended by: Baldo Ash D on: 06/17/2023 02:31 PM   Modules accepted: Orders

## 2023-06-17 NOTE — Progress Notes (Signed)
Cardiology Office Note:    Date:  06/17/2023   ID:  Tilmon, Wisehart February 02, 1941, MRN [id number]  PCP:  Paulina Fusi, MD  Cardiologist:  Gypsy Balsam, MD    Referring MD: Paulina Fusi, MD   Chief Complaint  Patient presents with   Follow-up    History of Present Illness:    Roger Smith is a 82 y.o. male with past medical history significant for ischemic cardiomyopathy previously ejection fraction of 20% after [place] surgery improvement to now 45%.  Bypass surgery was done in Jan 23, 2022 with a LIMA to LAD SVG to diagonal left radial to obtuse marginal SVG to PDA.  Course after by procedure was complicated by episode of atrial fibrillation required treatment with amiodarone.  He also received ICD related to diminished ejection fraction episodes of ventricular arrhythmia. Comes today to months for follow-up overall he is doing very well.  He denies have any chest pain tightness squeezing pressure burning chest he said he is cutting the grass have no difficulty doing it  Past Medical History:  Diagnosis Date   Chronic GERD    Coronary artery disease    Frequent PVCs    Hyperlipidemia    Myocardial infarction ([facility])    RBBB (right bundle branch block with left anterior fascicular block)    Syncope and collapse    TIA (transient ischemic attack)     Past Surgical History:  Procedure Laterality Date   CATARACT EXTRACTION Bilateral 2015   CORONARY ARTERY BYPASS GRAFT N/A 01/23/2022   Procedure: CORONARY ARTERY BYPASS GRAFTING (CABG) X4 USING LEFT INTERNAL MAMMARY ARTERY, LEFT RADIAL ARTERY, AND RIGHT GREATER SAPHENOUS VEIN HARVESTED ENDOSCOPICALLY;  Surgeon: Lovett Sox, MD;  Location: [facility] OR;  Service: Open Heart Surgery;  Laterality: N/A;   ICD IMPLANT N/A 05/24/2022   Procedure: ICD IMPLANT;  Surgeon: Regan Lemming, MD;  Location: [facility] INVASIVE CV LAB;  Service: Cardiovascular;  Laterality: N/A;   INGUINAL HERNIA REPAIR Right 2005   INGUINAL HERNIA REPAIR  Left 2017   Dr. Dimas Chyle   LEAD REVISION/REPAIR N/A 07/30/2022   Procedure: LEAD REVISION/REPAIR;  Surgeon: Regan Lemming, MD;  Location: [facility] INVASIVE CV LAB;  Service: Cardiovascular;  Laterality: N/A;   LEFT HEART CATH AND CORONARY ANGIOGRAPHY N/A 01/02/2022   Procedure: LEFT HEART CATH AND CORONARY ANGIOGRAPHY;  Surgeon: Kathleene Hazel, MD;  Location: [facility] INVASIVE CV LAB;  Service: Cardiovascular;  Laterality: N/A;   RADIAL ARTERY HARVEST Left 01/23/2022   Procedure: RADIAL ARTERY HARVEST;  Surgeon: Lovett Sox, MD;  Location: [facility] OR;  Service: Open Heart Surgery;  Laterality: Left;   TEE WITHOUT CARDIOVERSION N/A 01/23/2022   Procedure: TRANSESOPHAGEAL ECHOCARDIOGRAM (TEE);  Surgeon: Lovett Sox, MD;  Location: [facility] OR;  Service: Open Heart Surgery;  Laterality: N/A;    Current Medications: Current Meds  Medication Sig   aspirin EC 325 MG tablet Take 1 tablet (325 mg total) by mouth daily.   carvedilol (COREG) 6.25 MG tablet Take 6.25 mg by mouth 2 (two) times daily with a meal.   ENTRESTO 24-26 MG TAKE 1 TABLET BY MOUTH TWICE DAILY.   multivitamin-lutein (OCUVITE-LUTEIN) CAPS capsule Take 1 capsule by mouth 2 (two) times daily.   Omega-3 Fatty Acids (FISH OIL) 1200 MG CAPS Take 1,200 mg by mouth daily.   rosuvastatin (CRESTOR) 10 MG tablet TAKE 1 TABLET BY MOUTH DAILY   spironolactone (ALDACTONE) 25 MG tablet TAKE 1/2 TABLET BY MOUTH ONCE DAILY   [DISCONTINUED] amiodarone (PACERONE) 200 MG  tablet Take 200 mg by mouth daily.     Allergies:   Patient has no known allergies.   Social History   Socioeconomic History   Marital status: Widowed    Spouse name: Not on file   Number of children: Not on file   Years of education: Not on file   Highest education level: Not on file  Occupational History   Not on file  Tobacco Use   Smoking status: Never   Smokeless tobacco: Never  Vaping Use   Vaping status: Never Used  Substance and Sexual Activity   Alcohol use:  Never   Drug use: Never   Sexual activity: Not on file  Other Topics Concern   Not on file  Social History Narrative   Not on file   Social Determinants of Health   Financial Resource Strain: Not on file  Food Insecurity: Not on file  Transportation Needs: No Transportation Needs (02/09/2022)   PRAPARE - Administrator, Civil Service (Medical): No    Lack of Transportation (Non-Medical): No  Physical Activity: Not on file  Stress: Not on file  Social Connections: Unknown (02/09/2022)   Social Connection and Isolation Panel [NHANES]    Frequency of Communication with Friends and Family: Twice a week    Frequency of Social Gatherings with Friends and Family: Twice a week    Attends Religious Services: Not on Marketing executive or Organizations: Yes    Attends Banker Meetings: 1 to 4 times per year    Marital Status: Married     Family History: The patient's family history includes Heart attack in his mother; Heart disease in his mother; Stomach cancer in his maternal grandfather. ROS:   Please see the history of present illness.    All 14 point review of systems negative except as described per history of present illness  EKGs/Labs/Other Studies Reviewed:    EKG Interpretation Date/Time:  Tuesday June 17 2023 13:52:41 EDT Ventricular Rate:  72 PR Interval:  188 QRS Duration:  156 QT Interval:  452 QTC Calculation: 494 R Axis:   -67  Text Interpretation: Sinus rhythm with occasional Premature ventricular complexes Left axis deviation Right bundle branch block Left ventricular hypertrophy with repolarization abnormality Abnormal ECG When compared with ECG of 30-Jul-2022 16:04, Premature ventricular complexes are now Present Minimal criteria for Septal infarct are no longer Present T wave inversion less evident in Anterior leads Confirmed by Gypsy Balsam [phone]) on 06/17/2023 2:07:00 PM    Recent Labs: 07/16/2022: BUN 13;  Creatinine, Ser 1.01; Hemoglobin 13.7; Platelets 195; Potassium 5.1; Sodium 144  Recent Lipid Panel    Component Value Date/Time   CHOL 118 05/28/2022 1449   TRIG 53 05/28/2022 1449   HDL 45 05/28/2022 1449   CHOLHDL 2.6 05/28/2022 1449   VLDL 11 05/28/2022 1449   LDLCALC 62 05/28/2022 1449    Physical Exam:    VS:  BP 136/80 (BP Location: Left Arm, Patient Position: Sitting)   Pulse 72   Ht 5\' 9"  (1.753 m)   Wt 169 lb 12.8 oz (77 kg)   SpO2 93%   BMI 25.08 kg/m     Wt Readings from Last 3 Encounters:  06/17/23 169 lb 12.8 oz (77 kg)  11/29/22 171 lb (77.6 kg)  11/04/22 172 lb (78 kg)     GEN:  Well nourished, well developed in no acute distress HEENT: Normal NECK: No JVD; No carotid bruits LYMPHATICS:  No lymphadenopathy CARDIAC: RRR, no murmurs, no rubs, no gallops RESPIRATORY:  Clear to auscultation without rales, wheezing or rhonchi  ABDOMEN: Soft, non-tender, non-distended MUSCULOSKELETAL:  No edema; No deformity  SKIN: Warm and dry LOWER EXTREMITIES: no swelling NEUROLOGIC:  Alert and oriented x 3 PSYCHIATRIC:  Normal affect   ASSESSMENT:    1. Essential hypertension   2. Ischemic cardiomyopathy ejection fraction 20%   3. S/P CABG x 4   4. Dyslipidemia    PLAN:    In order of problems listed above:  Coronary disease doing well from that point review asymptomatic very happy and satisfied with where he is. History of ischemic cardiomyopathy always I see him I want to augment his therapy however he does not want to do it because she simply feels unwell, therefore I will continue present medications.  He is hemodynamically stable last echocardiogram show improvement left ventricle ejection fraction and he is clinically doing very well. Dyslipidemia he is taking Crestor 10 which I will continue.  Will make arrangements for his fasting lipid profile to be checked.  Last number I see is from summer of this year which show LDL of 62 HDL 53 we will continue present  management.   Medication Adjustments/Labs and Tests Ordered: Current medicines are reviewed at length with the patient today.  Concerns regarding medicines are outlined above.  Orders Placed This Encounter  Procedures   EKG 12-Lead   Medication changes: No orders of the defined types were placed in this encounter.   Signed, Georgeanna Lea, MD, [facility] 06/17/2023 2:15 PM    [facility]

## 2023-06-17 NOTE — Patient Instructions (Addendum)
Medication Instructions:   START: Entresto 24-26 1 tablet twice daily   Lab Work: None Ordered If you have labs (blood work) drawn today and your tests are completely normal, you will receive your results only by: MyChart Message (if you have MyChart) OR A paper copy in the mail If you have any lab test that is abnormal or we need to change your treatment, we will call you to review the results.   Testing/Procedures: None Ordered   Follow-Up: At [facility], you and your health needs are our priority.  As part of our continuing mission to provide you with exceptional heart care, we have created designated Provider Care Teams.  These Care Teams include your primary Cardiologist (physician) and Advanced Practice Providers (APPs -  Physician Assistants and Nurse Practitioners) who all work together to provide you with the care you need, when you need it.  We recommend signing up for the patient portal called "MyChart".  Sign up information is provided on this After Visit Summary.  MyChart is used to connect with patients for Virtual Visits (Telemedicine).  Patients are able to view lab/test results, encounter notes, upcoming appointments, etc.  Non-urgent messages can be sent to your provider as well.   To learn more about what you can do with MyChart, go to [email or web address].    Your next appointment:   6 month(s)  The format for your next appointment:   In Person  Provider:   Gypsy Balsam, MD    Other Instructions NA

## 2023-06-18 DIAGNOSIS — Z23 Encounter for immunization: Secondary | ICD-10-CM | POA: Diagnosis not present

## 2023-06-20 ENCOUNTER — Ambulatory Visit: Payer: Medicare Other | Admitting: Cardiology

## 2023-08-26 ENCOUNTER — Ambulatory Visit (INDEPENDENT_AMBULATORY_CARE_PROVIDER_SITE_OTHER): Payer: Medicare Other

## 2023-08-26 DIAGNOSIS — I255 Ischemic cardiomyopathy: Secondary | ICD-10-CM

## 2023-08-26 DIAGNOSIS — I5022 Chronic systolic (congestive) heart failure: Secondary | ICD-10-CM

## 2023-08-27 LAB — CUP PACEART REMOTE DEVICE CHECK
Battery Remaining Longevity: 162 mo
Battery Remaining Percentage: 100 %
Brady Statistic RV Percent Paced: 0 %
Date Time Interrogation Session: 20241224065000
HighPow Impedance: 69 Ohm
Implantable Lead Connection Status: 753985
Implantable Lead Implant Date: 20230922
Implantable Lead Location: 753860
Implantable Lead Model: 673
Implantable Lead Serial Number: 201030
Implantable Pulse Generator Implant Date: 20230922
Lead Channel Impedance Value: 686 Ohm
Lead Channel Setting Pacing Amplitude: 3 V
Lead Channel Setting Pacing Pulse Width: 1 ms
Lead Channel Setting Sensing Sensitivity: 0.5 mV
Pulse Gen Serial Number: 318429
Zone Setting Status: 755011

## 2023-09-11 DIAGNOSIS — I119 Hypertensive heart disease without heart failure: Secondary | ICD-10-CM | POA: Diagnosis not present

## 2023-09-11 DIAGNOSIS — I25708 Atherosclerosis of coronary artery bypass graft(s), unspecified, with other forms of angina pectoris: Secondary | ICD-10-CM | POA: Diagnosis not present

## 2023-09-11 DIAGNOSIS — I480 Paroxysmal atrial fibrillation: Secondary | ICD-10-CM | POA: Diagnosis not present

## 2023-09-11 DIAGNOSIS — E785 Hyperlipidemia, unspecified: Secondary | ICD-10-CM | POA: Diagnosis not present

## 2023-09-16 ENCOUNTER — Other Ambulatory Visit (HOSPITAL_COMMUNITY): Payer: Self-pay | Admitting: Cardiology

## 2023-10-07 NOTE — Progress Notes (Signed)
 Remote ICD transmission.

## 2023-10-07 NOTE — Addendum Note (Signed)
Addended by: Geralyn Flash D on: 10/07/2023 04:40 PM   Modules accepted: Orders

## 2023-11-10 ENCOUNTER — Encounter: Payer: Self-pay | Admitting: Cardiology

## 2023-11-10 ENCOUNTER — Ambulatory Visit: Payer: Medicare Other | Attending: Cardiology | Admitting: Cardiology

## 2023-11-10 VITALS — BP 152/82 | HR 68 | Ht 69.0 in | Wt 173.0 lb

## 2023-11-10 DIAGNOSIS — I493 Ventricular premature depolarization: Secondary | ICD-10-CM | POA: Diagnosis not present

## 2023-11-10 DIAGNOSIS — I2510 Atherosclerotic heart disease of native coronary artery without angina pectoris: Secondary | ICD-10-CM

## 2023-11-10 DIAGNOSIS — I5022 Chronic systolic (congestive) heart failure: Secondary | ICD-10-CM | POA: Diagnosis not present

## 2023-11-10 DIAGNOSIS — I255 Ischemic cardiomyopathy: Secondary | ICD-10-CM | POA: Diagnosis not present

## 2023-11-10 LAB — CUP PACEART INCLINIC DEVICE CHECK
Date Time Interrogation Session: 20250310154342
HighPow Impedance: 67 Ohm
Implantable Lead Connection Status: 753985
Implantable Lead Implant Date: 20230922
Implantable Lead Location: 753860
Implantable Lead Model: 673
Implantable Lead Serial Number: 201030
Implantable Pulse Generator Implant Date: 20230922
Lead Channel Impedance Value: 755 Ohm
Lead Channel Pacing Threshold Amplitude: 1.5 V
Lead Channel Pacing Threshold Pulse Width: 1 ms
Lead Channel Sensing Intrinsic Amplitude: 13.6 mV
Lead Channel Setting Pacing Amplitude: 3 V
Lead Channel Setting Pacing Pulse Width: 1 ms
Lead Channel Setting Sensing Sensitivity: 0.5 mV
Pulse Gen Serial Number: 318429
Zone Setting Status: 755011

## 2023-11-10 NOTE — Progress Notes (Signed)
  Electrophysiology Office Note:   Date:  11/10/2023  ID:  Roger Smith May 23, 1941, MRN [id number]  Primary Cardiologist: Gypsy Balsam, MD Primary Heart Failure: None Electrophysiologist: Faren Florence Jorja Loa, MD      History of Present Illness:   Roger Smith is a 83 y.o. male with h/o hypertension, hyperlipidemia, TIA, right bundle branch block, coronary artery disease post CABG x 4, chronic systolic heart failure seen today for routine electrophysiology followup.   Since last being seen in our clinic the patient reports doing overall well.  He is able to do his daily activities without restriction.  He has no chest pain or shortness of breath.  His blood pressure is elevated in clinic today, but it is usually in the 110-120 range systolic at home..  he denies chest pain, palpitations, dyspnea, PND, orthopnea, nausea, vomiting, dizziness, syncope, edema, weight gain, or early satiety.   Review of systems complete and found to be negative unless listed in HPI.      EP Information / Studies Reviewed:    EKG is ordered today. Personal review as below.  EKG Interpretation Date/Time:  Monday November 10 2023 12:29:06 EDT Ventricular Rate:  68 PR Interval:  180 QRS Duration:  148 QT Interval:  438 QTC Calculation: 465 R Axis:   -64  Text Interpretation: Sinus rhythm with frequent Premature ventricular complexes Left axis deviation Right bundle branch block Minimal voltage criteria for LVH, may be normal variant Septal infarct , age undetermined Abnormal ECG When compared with ECG of 17-Jun-2023 13:52, Septal infarct is now Present Confirmed by Caliana Spires ([id number]) on 11/10/2023 12:45:50 PM   ICD Interrogation-  reviewed in detail today,  See PACEART report.  Device History: Magazine features editor ICD implanted 05/24/2022 for chronic systolic heart failure History of appropriate therapy: No History of AAD therapy: No   Risk Assessment/Calculations:            Physical Exam:   VS:  BP (!) 152/82   Pulse 68   Ht 5\' 9"  (1.753 m)   Wt 173 lb (78.5 kg)   SpO2 98%   BMI 25.55 kg/m    Wt Readings from Last 3 Encounters:  11/10/23 173 lb (78.5 kg)  06/17/23 169 lb 12.8 oz (77 kg)  11/29/22 171 lb (77.6 kg)     GEN: Well nourished, well developed in no acute distress NECK: No JVD; No carotid bruits CARDIAC: Regular rate and rhythm, no murmurs, rubs, gallops RESPIRATORY:  Clear to auscultation without rales, wheezing or rhonchi  ABDOMEN: Soft, non-tender, non-distended EXTREMITIES:  No edema; No deformity   ASSESSMENT AND PLAN:    Chronic systolic dysfunction s/p [employer] single chamber ICD  euvolemic today Stable on an appropriate medical regimen Normal ICD function Sensing, threshold, impedance within normal limits Device programming reviewed, stable and appropriate for patient care See Arita Miss Art report No changes today  2.  Coronary artery disease: Post CABG x 4.  No current chest pain.  3.  Atrial fibrillation: Occurred postoperative.  In sinus rhythm.  No changes.  4.  PVCs/VT: Burden of 6.3%.  Minimal symptoms.  Continue with current management.  Disposition:   Follow up with Dr. Elberta Fortis in 12 months   Signed, Henna Derderian Jorja Loa, MD

## 2023-11-25 ENCOUNTER — Ambulatory Visit (INDEPENDENT_AMBULATORY_CARE_PROVIDER_SITE_OTHER): Payer: Self-pay

## 2023-11-25 DIAGNOSIS — I255 Ischemic cardiomyopathy: Secondary | ICD-10-CM

## 2023-11-26 LAB — CUP PACEART REMOTE DEVICE CHECK
Battery Remaining Longevity: 156 mo
Battery Remaining Percentage: 100 %
Brady Statistic RV Percent Paced: 0 %
Date Time Interrogation Session: 20250325071600
HighPow Impedance: 66 Ohm
Implantable Lead Connection Status: 753985
Implantable Lead Implant Date: 20230922
Implantable Lead Location: 753860
Implantable Lead Model: 673
Implantable Lead Serial Number: 201030
Implantable Pulse Generator Implant Date: 20230922
Lead Channel Impedance Value: 788 Ohm
Lead Channel Setting Pacing Amplitude: 3 V
Lead Channel Setting Pacing Pulse Width: 1 ms
Lead Channel Setting Sensing Sensitivity: 0.5 mV
Pulse Gen Serial Number: 318429
Zone Setting Status: 755011

## 2023-12-03 ENCOUNTER — Encounter: Payer: Self-pay | Admitting: Cardiology

## 2023-12-03 DIAGNOSIS — G459 Transient cerebral ischemic attack, unspecified: Secondary | ICD-10-CM | POA: Insufficient documentation

## 2023-12-03 DIAGNOSIS — I219 Acute myocardial infarction, unspecified: Secondary | ICD-10-CM | POA: Insufficient documentation

## 2023-12-03 DIAGNOSIS — K219 Gastro-esophageal reflux disease without esophagitis: Secondary | ICD-10-CM | POA: Insufficient documentation

## 2023-12-03 DIAGNOSIS — I452 Bifascicular block: Secondary | ICD-10-CM | POA: Insufficient documentation

## 2023-12-05 ENCOUNTER — Ambulatory Visit: Payer: Medicare Other | Attending: Cardiology | Admitting: Cardiology

## 2023-12-05 ENCOUNTER — Encounter: Payer: Self-pay | Admitting: Cardiology

## 2023-12-05 VITALS — BP 118/72 | HR 70 | Ht 70.0 in | Wt 170.0 lb

## 2023-12-05 DIAGNOSIS — I452 Bifascicular block: Secondary | ICD-10-CM

## 2023-12-05 DIAGNOSIS — G459 Transient cerebral ischemic attack, unspecified: Secondary | ICD-10-CM

## 2023-12-05 DIAGNOSIS — Z951 Presence of aortocoronary bypass graft: Secondary | ICD-10-CM

## 2023-12-05 DIAGNOSIS — E785 Hyperlipidemia, unspecified: Secondary | ICD-10-CM

## 2023-12-05 DIAGNOSIS — K219 Gastro-esophageal reflux disease without esophagitis: Secondary | ICD-10-CM

## 2023-12-05 DIAGNOSIS — I255 Ischemic cardiomyopathy: Secondary | ICD-10-CM

## 2023-12-05 NOTE — Patient Instructions (Signed)
Medication Instructions:  Your physician recommends that you continue on your current medications as directed. Please refer to the Current Medication list given to you today.  *If you need a refill on your cardiac medications before your next appointment, please call your pharmacy*   Lab Work: None ordered If you have labs (blood work) drawn today and your tests are completely normal, you will receive your results only by: MyChart Message (if you have MyChart) OR A paper copy in the mail If you have any lab test that is abnormal or we need to change your treatment, we will call you to review the results.   Testing/Procedures: Your physician has requested that you have an echocardiogram. Echocardiography is a painless test that uses sound waves to create images of your heart. It provides your doctor with information about the size and shape of your heart and how well your heart's chambers and valves are working. This procedure takes approximately one hour. There are no restrictions for this procedure. Please do NOT wear cologne, perfume, aftershave, or lotions (deodorant is allowed). Please arrive 15 minutes prior to your appointment time.     Follow-Up: At [facility], you and your health needs are our priority.  As part of our continuing mission to provide you with exceptional heart care, we have created designated Provider Care Teams.  These Care Teams include your primary Cardiologist (physician) and Advanced Practice Providers (APPs -  Physician Assistants and Nurse Practitioners) who all work together to provide you with the care you need, when you need it.  We recommend signing up for the patient portal called "MyChart".  Sign up information is provided on this After Visit Summary.  MyChart is used to connect with patients for Virtual Visits (Telemedicine).  Patients are able to view lab/test results, encounter notes, upcoming appointments, etc.  Non-urgent messages can be sent to  your provider as well.   To learn more about what you can do with MyChart, go to [email or web address].    Your next appointment:   6 month(s)  The format for your next appointment:   In Person  Provider:   Rajan Revankar, MD   Other Instructions Echocardiogram An echocardiogram is a test that uses sound waves (ultrasound) to produce images of the heart. Images from an echocardiogram can provide important information about: Heart size and shape. The size and thickness and movement of your heart's walls. Heart muscle function and strength. Heart valve function or if you have stenosis. Stenosis is when the heart valves are too narrow. If blood is flowing backward through the heart valves (regurgitation). A tumor or infectious growth around the heart valves. Areas of heart muscle that are not working well because of poor blood flow or injury from a heart attack. Aneurysm detection. An aneurysm is a weak or damaged part of an artery wall. The wall bulges out from the normal force of blood pumping through the body. Tell a health care provider about: Any allergies you have. All medicines you are taking, including vitamins, herbs, eye drops, creams, and over-the-counter medicines. Any blood disorders you have. Any surgeries you have had. Any medical conditions you have. Whether you are pregnant or may be pregnant. What are the risks? Generally, this is a safe test. However, problems may occur, including an allergic reaction to dye (contrast) that may be used during the test. What happens before the test? No specific preparation is needed. You may eat and drink normally. What happens during the test? You will   take off your clothes from the waist up and put on a hospital gown. Electrodes or electrocardiogram (ECG)patches may be placed on your chest. The electrodes or patches are then connected to a device that monitors your heart rate and rhythm. You will lie down on a table for an  ultrasound exam. A gel will be applied to your chest to help sound waves pass through your skin. A handheld device, called a transducer, will be pressed against your chest and moved over your heart. The transducer produces sound waves that travel to your heart and bounce back (or "echo" back) to the transducer. These sound waves will be captured in real-time and changed into images of your heart that can be viewed on a video monitor. The images will be recorded on a computer and reviewed by your health care provider. You may be asked to change positions or hold your breath for a short time. This makes it easier to get different views or better views of your heart. In some cases, you may receive contrast through an IV in one of your veins. This can improve the quality of the pictures from your heart. The procedure may vary among health care providers and hospitals.   What can I expect after the test? You may return to your normal, everyday life, including diet, activities, and medicines, unless your health care provider tells you not to do that. Follow these instructions at home: It is up to you to get the results of your test. Ask your health care provider, or the department that is doing the test, when your results will be ready. Keep all follow-up visits. This is important. Summary An echocardiogram is a test that uses sound waves (ultrasound) to produce images of the heart. Images from an echocardiogram can provide important information about the size and shape of your heart, heart muscle function, heart valve function, and other possible heart problems. You do not need to do anything to prepare before this test. You may eat and drink normally. After the echocardiogram is completed, you may return to your normal, everyday life, unless your health care provider tells you not to do that. This information is not intended to replace advice given to you by your health care provider. Make sure you  discuss any questions you have with your health care provider. Document Revised: 04/11/2020 Document Reviewed: 04/11/2020 Elsevier Patient Education  2021 Elsevier Inc.   Important Information About Sugar        

## 2023-12-05 NOTE — Addendum Note (Signed)
 Addended by: Eleonore Chiquito on: 12/05/2023 09:27 AM   Modules accepted: Orders

## 2023-12-05 NOTE — Progress Notes (Signed)
 Cardiology Office Note:    Date:  12/05/2023   ID:  Cadell, Gabrielson [birth year], MRN [id number]  PCP:  Paulina Fusi, MD  Cardiologist:  Gypsy Balsam, MD    Referring MD: Paulina Fusi, MD   No chief complaint on file.   History of Present Illness:    Roger Smith is a 83 y.o. male past medical history significant for ischemic cardiomyopathy previously ejection fraction 20% and then he had bypass surgery and ejection fraction proved to 45%.  Bypass surgery was done in Jan 23, 2022 with LIMA to LAD, SVG to diagonal, left radial to obtuse marginal, SVG to PDL.  Course after that was complicated by episode of atrial fibrillation treated with amiodarone.  He also received ICD for diminished ejection fraction which was [employer] device.  But so far no arrhythmias treated. Comes today 2 months for follow-up, doing well.  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   LIMA to LAD  SVG to DIAGONAL  LEFT RADIAL ARTERY to OM  SVG to PDA     Syncope 02/06/2022   Syncope and collapse    TIA (transient ischemic attack)    Ventricular ectopy 12/06/2021    Past Surgical History:  Procedure Laterality Date   CATARACT EXTRACTION Bilateral 2015   CORONARY ARTERY BYPASS GRAFT N/A 01/23/2022   Procedure: CORONARY ARTERY BYPASS GRAFTING (CABG) X4 USING LEFT INTERNAL MAMMARY ARTERY, LEFT RADIAL ARTERY, AND RIGHT GREATER SAPHENOUS VEIN HARVESTED  ENDOSCOPICALLY;  Surgeon: Lovett Sox, MD;  Location: [facility] OR;  Service: Open Heart Surgery;  Laterality: N/A;   ICD IMPLANT N/A 05/24/2022   Procedure: ICD IMPLANT;  Surgeon: Regan Lemming, MD;  Location: [facility] INVASIVE CV LAB;  Service: Cardiovascular;  Laterality: N/A;   INGUINAL HERNIA REPAIR Right 2005   INGUINAL HERNIA REPAIR Left 2017   Dr. Dimas Chyle   LEAD REVISION/REPAIR N/A 07/30/2022   Procedure: LEAD REVISION/REPAIR;  Surgeon: Regan Lemming, MD;  Location: [facility] INVASIVE CV LAB;  Service: Cardiovascular;  Laterality: N/A;   LEFT HEART CATH AND CORONARY ANGIOGRAPHY N/A 01/02/2022   Procedure: LEFT HEART CATH AND CORONARY ANGIOGRAPHY;  Surgeon: Kathleene Hazel, MD;  Location: [facility] INVASIVE CV LAB;  Service: Cardiovascular;  Laterality: N/A;   RADIAL ARTERY HARVEST Left 01/23/2022   Procedure: RADIAL ARTERY HARVEST;  Surgeon: Lovett Sox, MD;  Location: [facility] OR;  Service: Open Heart Surgery;  Laterality: Left;   TEE WITHOUT CARDIOVERSION N/A 01/23/2022   Procedure: TRANSESOPHAGEAL ECHOCARDIOGRAM (TEE);  Surgeon: Lovett Sox, MD;  Location: [facility] OR;  Service: Open Heart Surgery;  Laterality: N/A;    Current Medications: Current Meds  Medication Sig   aspirin EC 325 MG tablet Take 1 tablet (325 mg total) by mouth daily.   carvedilol (COREG) 6.25 MG tablet Take 1 tablet (6.25 mg total) by mouth 2 (two) times daily. NEEDS FOLLOW UP APPOINTMENT FOR MORE REFILLS   multivitamin-lutein (OCUVITE-LUTEIN) CAPS capsule Take  1 capsule by mouth 2 (two) times daily.   Omega-3 Fatty Acids (FISH OIL) 1200 MG CAPS Take 1,200 mg by mouth daily.   rosuvastatin (CRESTOR) 10 MG tablet TAKE 1 TABLET BY MOUTH DAILY   sacubitril-valsartan (ENTRESTO) 24-26 MG Take 1 tablet by mouth 2 (two) times daily.   spironolactone (ALDACTONE) 25 MG tablet TAKE 1/2 TABLET BY MOUTH ONCE DAILY     Allergies:   Patient has no known allergies.   Social History   Socioeconomic History   Marital status:  Widowed    Spouse name: Not on file   Number of children: Not on file   Years of education: Not on file   Highest education level: Not on file  Occupational History   Not on file  Tobacco Use   Smoking status: Never   Smokeless tobacco: Never  Vaping Use   Vaping status: Never Used  Substance and Sexual Activity   Alcohol use: Never   Drug use: Never   Sexual activity: Not on file  Other Topics Concern   Not on file  Social History Narrative   Not on file   Social Drivers of Health   Financial Resource Strain: Not on file  Food Insecurity: Not on file  Transportation Needs: No Transportation Needs (02/09/2022)   PRAPARE - Administrator, Civil Service (Medical): No    Lack of Transportation (Non-Medical): No  Physical Activity: Not on file  Stress: Not on file  Social Connections: Unknown (02/09/2022)   Social Connection and Isolation Panel [NHANES]    Frequency of Communication with Friends and Family: Twice a week    Frequency of Social Gatherings with Friends and Family: Twice a week    Attends Religious Services: Not on Marketing executive or Organizations: Yes    Attends Banker Meetings: 1 to 4 times per year    Marital Status: Married     Family History: The patient's family history includes Heart attack in his mother; Heart disease in his mother; Stomach cancer in his maternal grandfather. ROS:   Please see the history of present illness.    All 14 point review of systems negative except as described per history of present illness  EKGs/Labs/Other Studies Reviewed:         Recent Labs: No results found for requested labs within last 365 days.  Recent Lipid Panel    Component Value Date/Time   CHOL 118 05/28/2022 1449   TRIG 53 05/28/2022 1449   HDL 45 05/28/2022 1449   CHOLHDL 2.6 05/28/2022 1449   VLDL 11 05/28/2022 1449   LDLCALC 62 05/28/2022 1449    Physical Exam:    VS:  BP 118/72   Pulse 70   Ht 5\' 10"   (1.778 m)   Wt 170 lb (77.1 kg)   SpO2 98%   BMI 24.39 kg/m     Wt Readings from Last 3 Encounters:  12/05/23 170 lb (77.1 kg)  11/10/23 173 lb (78.5 kg)  06/17/23 169 lb 12.8 oz (77 kg)     GEN:  Well nourished, well developed in no acute distress HEENT: Normal NECK: No JVD; No carotid bruits LYMPHATICS: No lymphadenopathy CARDIAC: RRR, no murmurs, no rubs, no gallops RESPIRATORY:  Clear to auscultation without rales, wheezing or rhonchi  ABDOMEN: Soft, non-tender, non-distended MUSCULOSKELETAL:  No edema; No deformity  SKIN: Warm and dry LOWER EXTREMITIES: no swelling NEUROLOGIC:  Alert and oriented x 3 PSYCHIATRIC:  Normal  affect   ASSESSMENT:    1. Chronic GERD   2. RBBB (right bundle branch block with left anterior fascicular block)   3. TIA (transient ischemic attack)   4. Ischemic cardiomyopathy ejection fraction 20%   5. S/P CABG x 4   6. Dyslipidemia    PLAN:    In order of problems listed above:  Coronary artery disease stable from that point with status post bypass surgery denies having signs and symptoms that would indicate infective nature of the problem. Cardiomyopathy with improvement overall to 45%, he does and have any signs or symptoms of decompensation, we will repeat his echocardiogram to recheck left ventricular ejection fraction. Dyslipidemia I did review K PN his blood work done by primary care physician showing LDL 66 HDL 42 this is from general 05/2024 good control he is on Crestor 10 which is moderate intensity statin which I will continue.   Medication Adjustments/Labs and Tests Ordered: Current medicines are reviewed at length with the patient today.  Concerns regarding medicines are outlined above.  No orders of the defined types were placed in this encounter.  Medication changes: No orders of the defined types were placed in this encounter.   Signed, Georgeanna Lea, MD, [facility] 12/05/2023 9:21 AM     [facility]

## 2023-12-09 ENCOUNTER — Other Ambulatory Visit: Payer: Self-pay | Admitting: Cardiology

## 2023-12-16 ENCOUNTER — Other Ambulatory Visit (HOSPITAL_COMMUNITY): Payer: Self-pay | Admitting: Cardiology

## 2023-12-16 ENCOUNTER — Other Ambulatory Visit (HOSPITAL_COMMUNITY): Payer: Self-pay | Admitting: Adult Health

## 2023-12-26 ENCOUNTER — Other Ambulatory Visit: Payer: Self-pay

## 2023-12-26 ENCOUNTER — Ambulatory Visit: Attending: Cardiology

## 2023-12-26 DIAGNOSIS — Z951 Presence of aortocoronary bypass graft: Secondary | ICD-10-CM | POA: Diagnosis not present

## 2023-12-26 DIAGNOSIS — I452 Bifascicular block: Secondary | ICD-10-CM

## 2023-12-26 DIAGNOSIS — I255 Ischemic cardiomyopathy: Secondary | ICD-10-CM

## 2023-12-26 LAB — ECHOCARDIOGRAM COMPLETE
Area-P 1/2: 3.85 cm2
MV M vel: 4.3 m/s
MV Peak grad: 74 mmHg
S' Lateral: 4 cm

## 2023-12-26 MED ORDER — SPIRONOLACTONE 25 MG PO TABS: 12.5000 mg | ORAL_TABLET | Freq: Every day | ORAL | 3 refills | Status: AC

## 2024-01-06 ENCOUNTER — Telehealth: Payer: Self-pay

## 2024-01-06 NOTE — Telephone Encounter (Signed)
 Patient notified through my chart and LM informing pt of my chart message.

## 2024-01-07 NOTE — Telephone Encounter (Signed)
 Called the patient and informed him of the results of his echo. Patient verbalized understanding and had no further questions at this time.

## 2024-01-07 NOTE — Telephone Encounter (Signed)
 Patient is returning phone call in regard to Echo results. Please advise.

## 2024-01-09 NOTE — Addendum Note (Signed)
 Addended by: Lott Rouleau A on: 01/09/2024 10:03 AM   Modules accepted: Orders

## 2024-01-09 NOTE — Progress Notes (Signed)
 Remote ICD transmission.

## 2024-02-24 ENCOUNTER — Ambulatory Visit: Payer: Self-pay

## 2024-02-24 DIAGNOSIS — I255 Ischemic cardiomyopathy: Secondary | ICD-10-CM

## 2024-02-24 LAB — CUP PACEART REMOTE DEVICE CHECK
Battery Remaining Longevity: 156 mo
Battery Remaining Percentage: 100 %
Brady Statistic RV Percent Paced: 0 %
Date Time Interrogation Session: 20250624062500
HighPow Impedance: 67 Ohm
Implantable Lead Connection Status: 753985
Implantable Lead Implant Date: 20230922
Implantable Lead Location: 753860
Implantable Lead Model: 673
Implantable Lead Serial Number: 201030
Implantable Pulse Generator Implant Date: 20230922
Lead Channel Impedance Value: 876 Ohm
Lead Channel Setting Pacing Amplitude: 3 V
Lead Channel Setting Pacing Pulse Width: 1 ms
Lead Channel Setting Sensing Sensitivity: 0.5 mV
Pulse Gen Serial Number: 318429
Zone Setting Status: 755011

## 2024-02-26 ENCOUNTER — Ambulatory Visit: Payer: Self-pay | Admitting: Cardiology

## 2024-03-15 ENCOUNTER — Other Ambulatory Visit (HOSPITAL_COMMUNITY): Payer: Self-pay | Admitting: Cardiology

## 2024-03-15 DIAGNOSIS — I25708 Atherosclerosis of coronary artery bypass graft(s), unspecified, with other forms of angina pectoris: Secondary | ICD-10-CM | POA: Diagnosis not present

## 2024-03-15 DIAGNOSIS — E785 Hyperlipidemia, unspecified: Secondary | ICD-10-CM | POA: Diagnosis not present

## 2024-03-15 DIAGNOSIS — Z79899 Other long term (current) drug therapy: Secondary | ICD-10-CM | POA: Diagnosis not present

## 2024-03-15 DIAGNOSIS — I119 Hypertensive heart disease without heart failure: Secondary | ICD-10-CM | POA: Diagnosis not present

## 2024-03-15 DIAGNOSIS — I480 Paroxysmal atrial fibrillation: Secondary | ICD-10-CM | POA: Diagnosis not present

## 2024-03-18 ENCOUNTER — Other Ambulatory Visit: Payer: Self-pay | Admitting: Cardiology

## 2024-04-29 ENCOUNTER — Telehealth: Payer: Self-pay | Admitting: Cardiology

## 2024-04-29 ENCOUNTER — Other Ambulatory Visit (HOSPITAL_COMMUNITY): Payer: Self-pay | Admitting: Cardiology

## 2024-04-29 ENCOUNTER — Other Ambulatory Visit: Payer: Self-pay | Admitting: Cardiology

## 2024-04-29 NOTE — Telephone Encounter (Signed)
 Patient is requesting a refill on his carvedilol  6.25mg . CB # [phone]

## 2024-04-30 MED ORDER — CARVEDILOL 6.25 MG PO TABS: 6.2500 mg | ORAL_TABLET | Freq: Two times a day (BID) | ORAL | 2 refills | Status: AC

## 2024-04-30 NOTE — Telephone Encounter (Signed)
 Pt's medication was sent to pt's pharmacy as requested. Confirmation received.

## 2024-04-30 NOTE — Telephone Encounter (Signed)
 Pt follow by gen cardiology [place])

## 2024-05-25 ENCOUNTER — Ambulatory Visit: Payer: Self-pay

## 2024-05-25 DIAGNOSIS — I452 Bifascicular block: Secondary | ICD-10-CM

## 2024-05-26 LAB — CUP PACEART REMOTE DEVICE CHECK
Battery Remaining Longevity: 150 mo
Battery Remaining Percentage: 100 %
Brady Statistic RV Percent Paced: 0 %
Date Time Interrogation Session: 20250923054500
HighPow Impedance: 67 Ohm
Implantable Lead Connection Status: 753985
Implantable Lead Implant Date: 20230922
Implantable Lead Location: 753860
Implantable Lead Model: 673
Implantable Lead Serial Number: 201030
Implantable Pulse Generator Implant Date: 20230922
Lead Channel Impedance Value: 1036 Ohm
Lead Channel Setting Pacing Amplitude: 3 V
Lead Channel Setting Pacing Pulse Width: 1 ms
Lead Channel Setting Sensing Sensitivity: 0.5 mV
Pulse Gen Serial Number: 318429
Zone Setting Status: 755011

## 2024-05-26 NOTE — Progress Notes (Signed)
Remote ICD Transmission.

## 2024-05-27 ENCOUNTER — Ambulatory Visit: Payer: Self-pay | Admitting: Cardiology

## 2024-05-31 IMAGING — CR DG CHEST 2V
2 series · 2 of 2 positions shown · non-contrast
Comparison: 02/07/2022

CLINICAL DATA: Status post CABG.

EXAM:
CHEST - 2 VIEW

[w chest pa]
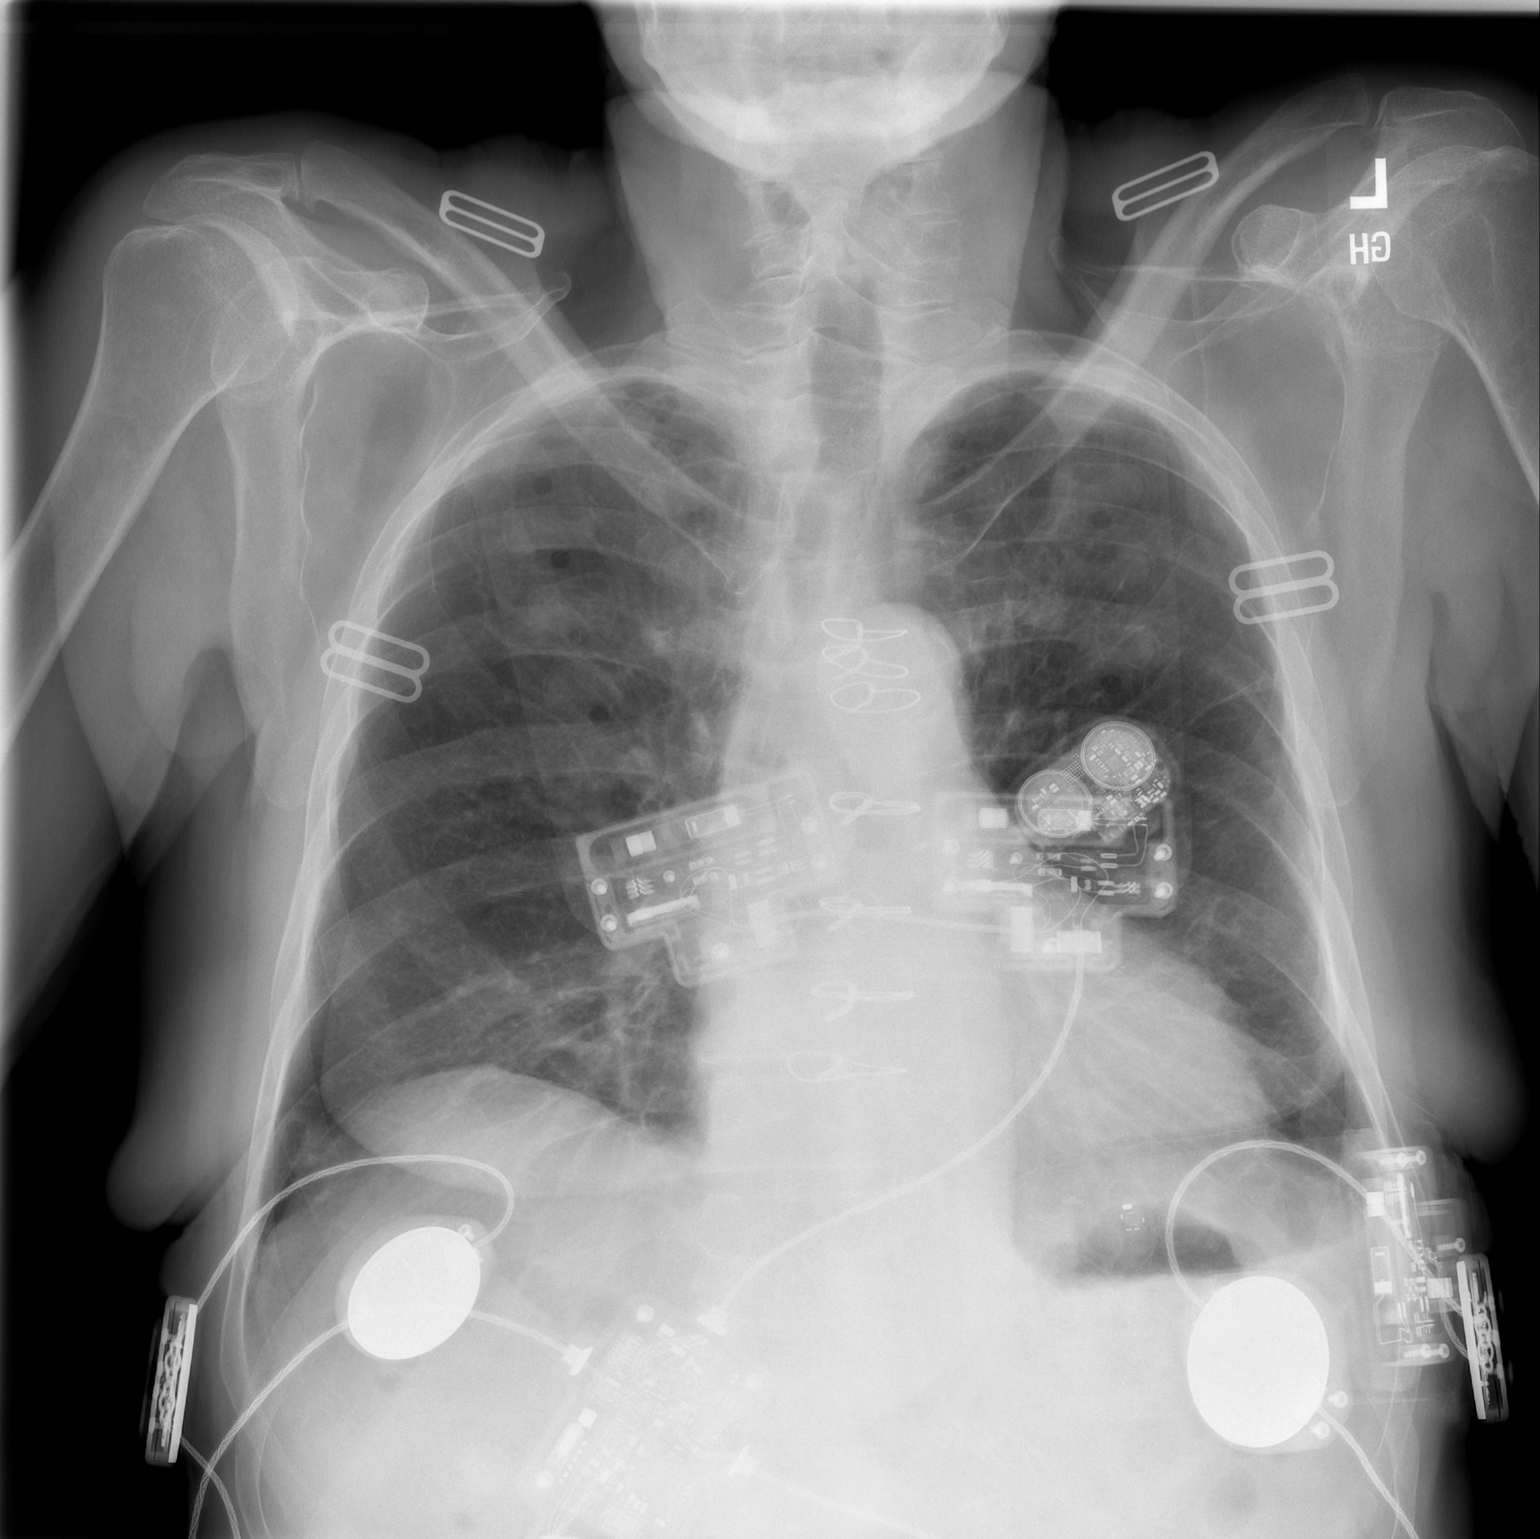

[w chest lat]
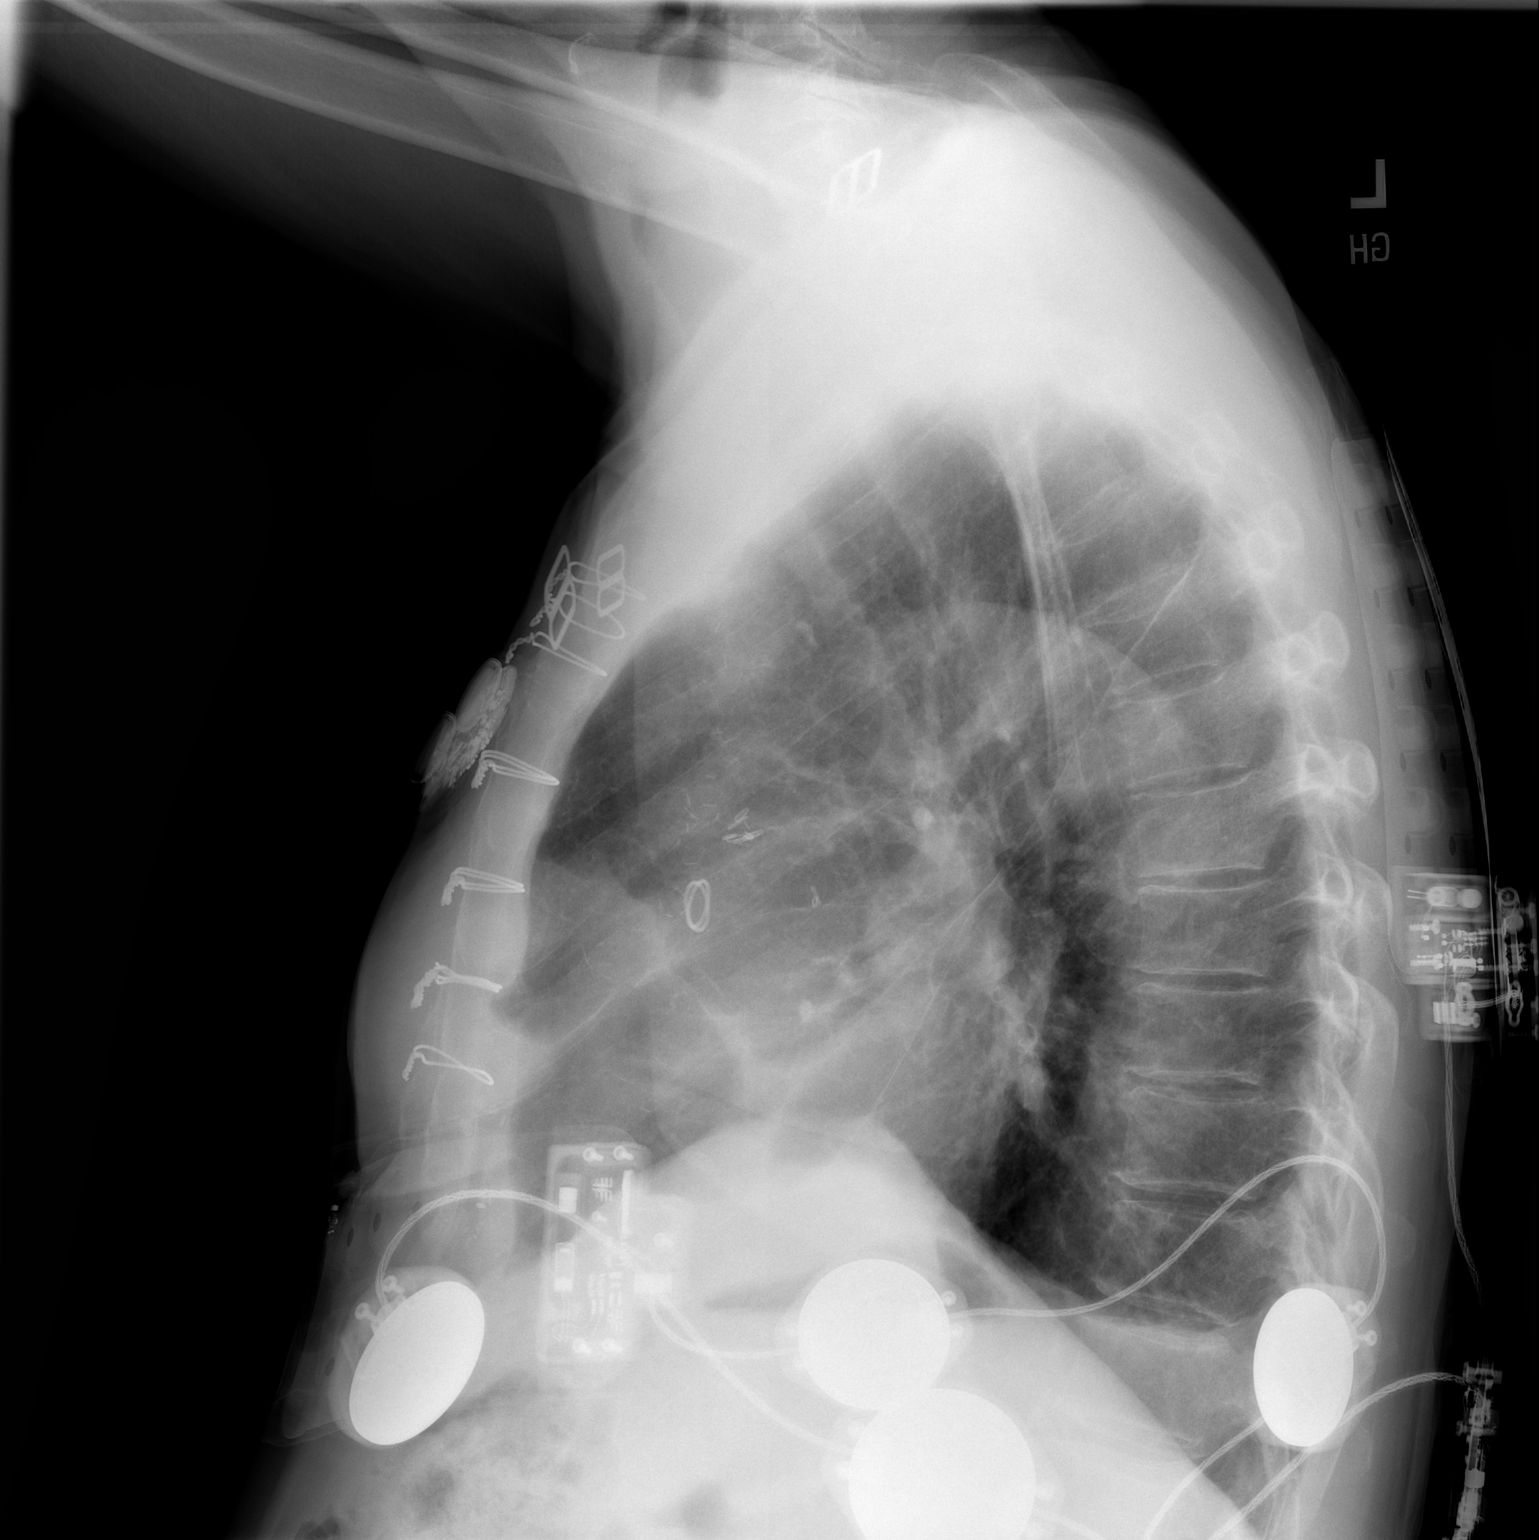

[2 of 2 positions shown; findings below may reference images not displayed]

FINDINGS: The lungs are clear without focal pneumonia, edema, pneumothorax or
pleural effusion. Probable small left pleural effusion.
Cardiopericardial silhouette is at upper limits of normal for size.
Bones are diffusely demineralized. Support apparatus obscures
portions of the chest.
IMPRESSION: Probable small left pleural effusion. Otherwise no acute
cardiopulmonary findings.

## 2024-05-31 NOTE — Progress Notes (Signed)
 Remote ICD transmission.

## 2024-06-01 DIAGNOSIS — I493 Ventricular premature depolarization: Secondary | ICD-10-CM | POA: Insufficient documentation

## 2024-06-02 ENCOUNTER — Encounter: Payer: Self-pay | Admitting: Cardiology

## 2024-06-02 ENCOUNTER — Ambulatory Visit: Attending: Cardiology | Admitting: Cardiology

## 2024-06-02 VITALS — BP 148/76 | HR 54 | Ht 69.0 in | Wt 167.6 lb

## 2024-06-02 DIAGNOSIS — E785 Hyperlipidemia, unspecified: Secondary | ICD-10-CM | POA: Diagnosis not present

## 2024-06-02 DIAGNOSIS — Z951 Presence of aortocoronary bypass graft: Secondary | ICD-10-CM | POA: Diagnosis not present

## 2024-06-02 DIAGNOSIS — I10 Essential (primary) hypertension: Secondary | ICD-10-CM

## 2024-06-02 DIAGNOSIS — I452 Bifascicular block: Secondary | ICD-10-CM

## 2024-06-02 NOTE — Patient Instructions (Signed)
 Medication Instructions:  Your physician recommends that you continue on your current medications as directed. Please refer to the Current Medication list given to you today.  *If you need a refill on your cardiac medications before your next appointment, please call your pharmacy*  Lab Work: None If you have labs (blood work) drawn today and your tests are completely normal, you will receive your results only by: MyChart Message (if you have MyChart) OR A paper copy in the mail If you have any lab test that is abnormal or we need to change your treatment, we will call you to review the results.  Testing/Procedures: None  Follow-Up: At [facility], you and your health needs are our priority.  As part of our continuing mission to provide you with exceptional heart care, our providers are all part of one team.  This team includes your primary Cardiologist (physician) and Advanced Practice Providers or APPs (Physician Assistants and Nurse Practitioners) who all work together to provide you with the care you need, when you need it.  Your next appointment:   6 month(s)  Provider:   Gypsy Balsam, MD  We recommend signing up for the patient portal called "MyChart".  Sign up information is provided on this After Visit Summary.  MyChart is used to connect with patients for Virtual Visits (Telemedicine).  Patients are able to view lab/test results, encounter notes, upcoming appointments, etc.  Non-urgent messages can be sent to your provider as well.   To learn more about what you can do with MyChart, go to [email or web address].   Other Instructions None

## 2024-06-02 NOTE — Progress Notes (Unsigned)
 Cardiology Office Note:    Date:  06/02/2024   ID:  Smith, Roger [birth year], MRN [id number]  PCP:  Roger Vicenta BRAVO, MD  Cardiologist:  Lamar Fitch, MD    Referring MD: Roger Vicenta BRAVO, MD   No chief complaint on file. Doing very well  History of Present Illness:    Roger Smith is a 83 y.o. male past medical history significant for cardiomyopathy initial ejection fraction 20% and coronary artery bypass graft done in Jan 23, 2022 with LIMA to LAD SVG to diagonal left radial to obtuse marginal SVG to PDL that led to improvement left ventricular ejection fraction initially to 45% last echocardiogram 45-50, initial course of bypass surgery was complicated with atrial fibrillation managed with amiodarone  he also received a [employer] ICD device.  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   Chronic GERD    Coronary artery disease    Dyslipidemia 12/06/2021   Essential hypertension 12/06/2021   Frequent PVCs    Heart failure (HCC) 02/07/2022   Hyperlipidemia    Ischemic cardiomyopathy ejection fraction 20% 04/04/2022   Myocardial infarction (HCC)    Postop check 02/18/2022   RBBB (right bundle branch block with left anterior fascicular block)    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   LIMA to LAD  SVG to DIAGONAL  LEFT RADIAL ARTERY to OM  SVG to PDA     Syncope 02/06/2022   Syncope and collapse    TIA (transient ischemic attack)    Ventricular ectopy 12/06/2021    Past Surgical History:  Procedure Laterality Date   CATARACT EXTRACTION Bilateral 2015   CORONARY ARTERY BYPASS GRAFT N/A 01/23/2022   Procedure: CORONARY ARTERY BYPASS GRAFTING (CABG) X4 USING LEFT INTERNAL MAMMARY ARTERY, LEFT RADIAL ARTERY, AND RIGHT GREATER SAPHENOUS VEIN HARVESTED ENDOSCOPICALLY;   Surgeon: Obadiah Coy, MD;  Location: [facility] OR;  Service: Open Heart Surgery;  Laterality: N/A;   ICD IMPLANT N/A 05/24/2022   Procedure: ICD IMPLANT;  Surgeon: Inocencio Soyla Lunger, MD;  Location: [facility] INVASIVE CV LAB;  Service: Cardiovascular;  Laterality: N/A;   INGUINAL HERNIA REPAIR Right 2005   INGUINAL HERNIA REPAIR Left 2017   Dr. Gerard   LEAD REVISION/REPAIR N/A 07/30/2022   Procedure: LEAD REVISION/REPAIR;  Surgeon: Inocencio Soyla Lunger, MD;  Location: [facility] INVASIVE CV LAB;  Service: Cardiovascular;  Laterality: N/A;   LEFT HEART CATH AND CORONARY ANGIOGRAPHY N/A 01/02/2022   Procedure: LEFT HEART CATH AND CORONARY ANGIOGRAPHY;  Surgeon: Verlin Lonni BIRCH, MD;  Location: [facility] INVASIVE CV LAB;  Service: Cardiovascular;  Laterality: N/A;   RADIAL ARTERY HARVEST Left 01/23/2022   Procedure: RADIAL ARTERY HARVEST;  Surgeon: Obadiah Coy, MD;  Location: [facility] OR;  Service: Open Heart Surgery;  Laterality: Left;   TEE WITHOUT CARDIOVERSION N/A 01/23/2022   Procedure: TRANSESOPHAGEAL ECHOCARDIOGRAM (TEE);  Surgeon: Obadiah Coy, MD;  Location: [facility] OR;  Service: Open Heart Surgery;  Laterality: N/A;    Current Medications: Current Meds  Medication Sig   aspirin  EC 325 MG tablet Take 1 tablet (325 mg total) by mouth daily.   carvedilol  (COREG ) 6.25 MG tablet Take 1 tablet (6.25 mg total) by mouth 2 (two) times daily with a meal.   multivitamin-lutein  (OCUVITE-LUTEIN ) CAPS capsule Take 1 capsule by mouth 2 (two) times daily.   Omega-3 Fatty Acids (  FISH OIL) 1200 MG CAPS Take 1,200 mg by mouth daily.   rosuvastatin  (CRESTOR ) 10 MG tablet Take 1 tablet (10 mg total) by mouth daily.   sacubitril-valsartan (ENTRESTO ) 24-26 MG Take 1 tablet by mouth twice daily   spironolactone  (ALDACTONE ) 25 MG tablet Take 0.5 tablets (12.5 mg total) by mouth daily.     Allergies:   Patient has no known allergies.   Social History   Socioeconomic History   Marital status: Widowed    Spouse name: Not on  file   Number of children: Not on file   Years of education: Not on file   Highest education level: Not on file  Occupational History   Not on file  Tobacco Use   Smoking status: Never   Smokeless tobacco: Never  Vaping Use   Vaping status: Never Used  Substance and Sexual Activity   Alcohol use: Never   Drug use: Never   Sexual activity: Not on file  Other Topics Concern   Not on file  Social History Narrative   Not on file   Social Drivers of Health   Financial Resource Strain: Not on file  Food Insecurity: Not on file  Transportation Needs: No Transportation Needs (02/09/2022)   PRAPARE - Administrator, Civil Service (Medical): No    Lack of Transportation (Non-Medical): No  Physical Activity: Not on file  Stress: Not on file  Social Connections: Unknown (02/09/2022)   Social Connection and Isolation Panel    Frequency of Communication with Friends and Family: Twice a week    Frequency of Social Gatherings with Friends and Family: Twice a week    Attends Religious Services: Not on Marketing executive or Organizations: Yes    Attends Banker Meetings: 1 to 4 times per year    Marital Status: Married     Family History: The patient's family history includes Heart attack in his mother; Heart disease in his mother; Stomach cancer in his maternal grandfather. ROS:   Please see the history of present illness.    All 14 point review of systems negative except as described per history of present illness  EKGs/Labs/Other Studies Reviewed:         Recent Labs: No results found for requested labs within last 365 days.  Recent Lipid Panel    Component Value Date/Time   CHOL 118 05/28/2022 1449   TRIG 53 05/28/2022 1449   HDL 45 05/28/2022 1449   CHOLHDL 2.6 05/28/2022 1449   VLDL 11 05/28/2022 1449   LDLCALC 62 05/28/2022 1449    Physical Exam:    VS:  BP (!) 148/76   Pulse (!) 54   Ht 5' 9 (1.753 m)   Wt 167 lb 9.6 oz (76  kg)   SpO2 94%   BMI 24.75 kg/m     Wt Readings from Last 3 Encounters:  06/02/24 167 lb 9.6 oz (76 kg)  12/05/23 170 lb (77.1 kg)  11/10/23 173 lb (78.5 kg)     GEN:  Well nourished, well developed in no acute distress HEENT: Normal NECK: No JVD; No carotid bruits LYMPHATICS: No lymphadenopathy CARDIAC: RRR, no murmurs, no rubs, no gallops RESPIRATORY:  Clear to auscultation without rales, wheezing or rhonchi  ABDOMEN: Soft, non-tender, non-distended MUSCULOSKELETAL:  No edema; No deformity  SKIN: Warm and dry LOWER EXTREMITIES: no swelling NEUROLOGIC:  Alert and oriented x 3 PSYCHIATRIC:  Normal affect   ASSESSMENT:  1. S/P CABG x 4   2. Dyslipidemia   3. Essential hypertension   4. RBBB (right bundle branch block with left anterior fascicular block)    PLAN:    In order of problems listed above:  Coronary disease stable from that point review, status post coronary artery bypass graft.  Noted.  Continue present management. Dyslipidemia I did review [facility] which show me data from 03/15/2024 LDL 4058 HDL 47, continue present management. Essential hypertension blood pressure elevated at the office however I did review his blood test from home and that is always good.  Continue present management.  Can right bundle branch block is a chronic phenomenon and continue watching.   Medication Adjustments/Labs and Tests Ordered: Current medicines are reviewed at length with the patient today.  Concerns regarding medicines are outlined above.  No orders of the defined types were placed in this encounter.  Medication changes: No orders of the defined types were placed in this encounter.   Signed, Lamar DOROTHA Fitch, MD, [facility] 06/02/2024 8:37 AM    [facility]

## 2024-06-14 ENCOUNTER — Encounter: Payer: Self-pay | Admitting: Cardiology

## 2024-06-14 ENCOUNTER — Ambulatory Visit: Attending: Cardiology | Admitting: Cardiology

## 2024-06-14 VITALS — BP 110/80 | HR 68 | Ht 69.0 in | Wt 169.0 lb

## 2024-06-14 DIAGNOSIS — I10 Essential (primary) hypertension: Secondary | ICD-10-CM | POA: Diagnosis not present

## 2024-06-14 DIAGNOSIS — I2510 Atherosclerotic heart disease of native coronary artery without angina pectoris: Secondary | ICD-10-CM | POA: Diagnosis not present

## 2024-06-14 DIAGNOSIS — Z951 Presence of aortocoronary bypass graft: Secondary | ICD-10-CM | POA: Diagnosis not present

## 2024-06-14 DIAGNOSIS — I493 Ventricular premature depolarization: Secondary | ICD-10-CM | POA: Diagnosis not present

## 2024-06-14 NOTE — Patient Instructions (Signed)
 Medication Instructions:  Your physician recommends that you continue on your current medications as directed. Please refer to the Current Medication list given to you today.  *If you need a refill on your cardiac medications before your next appointment, please call your pharmacy*   Lab Work: None Ordered If you have labs (blood work) drawn today and your tests are completely normal, you will receive your results only by: MyChart Message (if you have MyChart) OR A paper copy in the mail If you have any lab test that is abnormal or we need to change your treatment, we will call you to review the results.   Testing/Procedures: None Ordered   Follow-Up: At [facility], you and your health needs are our priority.  As part of our continuing mission to provide you with exceptional heart care, we have created designated Provider Care Teams.  These Care Teams include your primary Cardiologist (physician) and Advanced Practice Providers (APPs -  Physician Assistants and Nurse Practitioners) who all work together to provide you with the care you need, when you need it.  We recommend signing up for the patient portal called MyChart.  Sign up information is provided on this After Visit Summary.  MyChart is used to connect with patients for Virtual Visits (Telemedicine).  Patients are able to view lab/test results, encounter notes, upcoming appointments, etc.  Non-urgent messages can be sent to your provider as well.   To learn more about what you can do with MyChart, go to [email or web address].    Your next appointment:   4 month(s)  The format for your next appointment:   In Person  Provider:   Lamar Fitch, MD    Other Instructions NA

## 2024-06-14 NOTE — Progress Notes (Signed)
 Cardiology Office Note:    Date:  06/14/2024   ID:  Jakhai, Fant 1941-07-03, MRN [id number]  PCP:  Keren Vicenta BRAVO, MD  Cardiologist:  Lamar Fitch, MD    Referring MD: Keren Vicenta BRAVO, MD   Chief Complaint  Patient presents with   Dizziness    History of Present Illness:    Roger Smith is a 83 y.o. male   male past medical history significant for cardiomyopathy initial ejection fraction 20% and coronary artery bypass graft done in Jan 23, 2022 with LIMA to LAD SVG to diagonal left radial to obtuse marginal SVG to PDL that led to improvement left ventricular ejection fraction initially to 45% last echocardiogram 45-50, initial course of bypass surgery was complicated with atrial fibrillation managed with amiodarone  he also received a [employer] ICD device.  Comes today to my office yesterday he was at the church in the morning he got up in the morning drink coffee ate 2 [employer] and went to the church when he was standing over there for a while he started getting dizzy swimmy headed please sit down he started sweating some and somebody asking what is going on he said that he was very hot they went outside and then everything subsided.  EMS came his EKG he show me shows some PVCs, blood pressure was elevated heart rate was normal oxygen saturation was normal he refused to go to the hospital.  Comes today.  Overall doing well.  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    LIMA to LAD  SVG to DIAGONAL  LEFT RADIAL ARTERY to OM  SVG to PDA     Syncope 02/06/2022   Syncope and collapse    TIA (transient ischemic attack)    Ventricular ectopy 12/06/2021    Past Surgical History:  Procedure Laterality Date   CATARACT EXTRACTION Bilateral 2015   CORONARY ARTERY BYPASS GRAFT N/A 01/23/2022   Procedure: CORONARY ARTERY BYPASS GRAFTING (CABG) X4 USING LEFT INTERNAL MAMMARY ARTERY, LEFT RADIAL ARTERY, AND RIGHT GREATER SAPHENOUS VEIN HARVESTED ENDOSCOPICALLY;  Surgeon: Obadiah Coy, MD;  Location: [facility] OR;  Service: Open Heart Surgery;  Laterality: N/A;   ICD IMPLANT N/A 05/24/2022   Procedure: ICD IMPLANT;  Surgeon: Inocencio Soyla Lunger, MD;  Location: [facility] INVASIVE CV LAB;  Service: Cardiovascular;  Laterality: N/A;   INGUINAL HERNIA REPAIR Right 2005   INGUINAL HERNIA REPAIR Left 2017   Dr. Gerard   LEAD REVISION/REPAIR N/A 07/30/2022   Procedure: LEAD REVISION/REPAIR;  Surgeon: Inocencio Soyla Lunger, MD;  Location: [facility] INVASIVE CV LAB;  Service: Cardiovascular;  Laterality: N/A;   LEFT HEART CATH AND CORONARY ANGIOGRAPHY N/A 01/02/2022   Procedure: LEFT HEART CATH AND CORONARY ANGIOGRAPHY;  Surgeon: Verlin Lonni BIRCH, MD;  Location: [facility] INVASIVE CV LAB;  Service: Cardiovascular;  Laterality: N/A;   RADIAL ARTERY HARVEST Left 01/23/2022   Procedure: RADIAL ARTERY HARVEST;  Surgeon: Obadiah Coy, MD;  Location: [facility] OR;  Service: Open Heart Surgery;  Laterality: Left;   TEE WITHOUT CARDIOVERSION N/A 01/23/2022   Procedure: TRANSESOPHAGEAL ECHOCARDIOGRAM (TEE);  Surgeon: Obadiah Coy, MD;  Location: [facility] OR;  Service: Open Heart Surgery;  Laterality: N/A;    Current Medications: Current Meds  Medication Sig   aspirin  EC 325 MG tablet Take 1 tablet (325 mg total) by mouth daily.   carvedilol  (COREG ) 6.25 MG tablet Take 1 tablet (6.25 mg total) by mouth 2 (two) times daily with a meal.   multivitamin-lutein  (OCUVITE-LUTEIN ) CAPS capsule Take 1 capsule by mouth 2  (two) times daily.   Omega-3 Fatty Acids (FISH OIL) 1200 MG CAPS Take 1,200 mg by mouth daily.   rosuvastatin  (CRESTOR ) 10 MG tablet Take 1 tablet (10 mg total) by mouth daily.   sacubitril-valsartan (ENTRESTO ) 24-26 MG Take 1 tablet by mouth twice daily   spironolactone  (ALDACTONE ) 25 MG tablet Take 0.5 tablets (12.5 mg total) by mouth daily.     Allergies:   Patient has no known allergies.   Social History   Socioeconomic History   Marital status: Widowed    Spouse name: Not on file   Number of children: Not on file   Years of education: Not on file   Highest education level: Not on file  Occupational History   Not on file  Tobacco Use   Smoking status: Never   Smokeless tobacco: Never  Vaping Use   Vaping status: Never Used  Substance and Sexual Activity   Alcohol use: Never   Drug use: Never   Sexual activity: Not on file  Other Topics Concern   Not on file  Social History Narrative   Not on file   Social Drivers of Health   Financial Resource Strain: Not on file  Food Insecurity: Not on file  Transportation Needs: No Transportation Needs (02/09/2022)   PRAPARE - Administrator, Civil Service (Medical): No    Lack of Transportation (Non-Medical): No  Physical Activity: Not on file  Stress: Not on file  Social Connections: Unknown (02/09/2022)   Social Connection and Isolation Panel    Frequency of Communication with Friends and Family: Twice a week    Frequency of Social Gatherings with Friends and Family: Twice a week    Attends Religious Services: Not on Marketing executive or Organizations: Yes    Attends Banker Meetings: 1 to 4 times per year    Marital Status: Married     Family History: The patient's family history includes Heart attack in his mother; Heart disease in his mother; Stomach cancer in his maternal grandfather. ROS:   Please see the history of present illness.    All 14 point review of systems negative  except as described per history of present illness  EKGs/Labs/Other Studies Reviewed:    EKG Interpretation Date/Time:  Monday June 14 2024 10:59:21 EDT Ventricular Rate:  68 PR Interval:  170 QRS Duration:  154 QT Interval:  410 QTC Calculation: 435 R Axis:   -58  Text Interpretation: Sinus rhythm with sinus arrhythmia with occasional Premature ventricular complexes Left axis deviation Right bundle branch block Left ventricular hypertrophy with repolarization abnormality Cannot rule out Septal infarct (cited on or before 10-Nov-2023) Abnormal ECG When compared with ECG of 10-Nov-2023 12:29, Questionable change in initial forces of Septal leads Confirmed by Bernie Charleston [phone]) on 06/14/2024 11:14:55 AM    Recent Labs: No results found for requested labs within last 365 days.  Recent Lipid Panel  Component Value Date/Time   CHOL 118 05/28/2022 1449   TRIG 53 05/28/2022 1449   HDL 45 05/28/2022 1449   CHOLHDL 2.6 05/28/2022 1449   VLDL 11 05/28/2022 1449   LDLCALC 62 05/28/2022 1449    Physical Exam:    VS:  BP 110/80   Pulse 68   Ht 5' 9 (1.753 m)   Wt 169 lb (76.7 kg)   SpO2 94%   BMI 24.96 kg/m     Wt Readings from Last 3 Encounters:  06/14/24 169 lb (76.7 kg)  06/02/24 167 lb 9.6 oz (76 kg)  12/05/23 170 lb (77.1 kg)     GEN:  Well nourished, well developed in no acute distress HEENT: Normal NECK: No JVD; No carotid bruits LYMPHATICS: No lymphadenopathy CARDIAC: RRR, no murmurs, no rubs, no gallops RESPIRATORY:  Clear to auscultation without rales, wheezing or rhonchi  ABDOMEN: Soft, non-tender, non-distended MUSCULOSKELETAL:  No edema; No deformity  SKIN: Warm and dry LOWER EXTREMITIES: no swelling NEUROLOGIC:  Alert and oriented x 3 PSYCHIATRIC:  Normal affect   ASSESSMENT:    1. Essential hypertension   2. Frequent PVCs   3. Coronary artery disease involving native coronary artery of native heart without angina pectoris   4. S/P CABG x 4     PLAN:    In order of problems listed above:  Dizziness.  I suspect was vagal, ask him to stay well-hydrated, ask him also when he feels the sensation to sit down we will talk about potentially wearing monitor but he does have defibrillator.  No discharges from the device. Frequent PVCs.  Noted overall stable. Coronary disease stable denies have any chest pain tightness squeezing pressure.. Essential hypertension under control. I suspect his episode was probably vagal, I told him if he had to the situation again he is to let me know   Medication Adjustments/Labs and Tests Ordered: Current medicines are reviewed at length with the patient today.  Concerns regarding medicines are outlined above.  Orders Placed This Encounter  Procedures   EKG 12-Lead   Medication changes: No orders of the defined types were placed in this encounter.   Signed, Lamar DOROTHA Fitch, MD, [facility] 06/14/2024 11:22 AM    [facility]

## 2024-08-24 ENCOUNTER — Ambulatory Visit: Payer: Self-pay

## 2024-08-24 DIAGNOSIS — I452 Bifascicular block: Secondary | ICD-10-CM | POA: Diagnosis not present

## 2024-08-25 LAB — CUP PACEART REMOTE DEVICE CHECK
Battery Remaining Longevity: 150 mo
Battery Remaining Percentage: 100 %
Brady Statistic RV Percent Paced: 0 %
Date Time Interrogation Session: 20251223075600
HighPow Impedance: 73 Ohm
Implantable Lead Connection Status: 753985
Implantable Lead Implant Date: 20230922
Implantable Lead Location: 753860
Implantable Lead Model: 673
Implantable Lead Serial Number: 201030
Implantable Pulse Generator Implant Date: 20230922
Lead Channel Impedance Value: 1176 Ohm
Lead Channel Setting Pacing Amplitude: 3 V
Lead Channel Setting Pacing Pulse Width: 1 ms
Lead Channel Setting Sensing Sensitivity: 0.5 mV
Pulse Gen Serial Number: 318429
Zone Setting Status: 755011

## 2024-08-25 NOTE — Progress Notes (Signed)
 Remote ICD Transmission

## 2024-08-27 ENCOUNTER — Ambulatory Visit: Payer: Self-pay | Admitting: Cardiology

## 2024-09-06 ENCOUNTER — Telehealth: Payer: Self-pay | Admitting: Cardiology

## 2024-09-06 NOTE — Telephone Encounter (Signed)
 Dr. Inocencio has no est patient slots open and this patient came in wanting to schedule an appointment. I didn't want to use a consult spot and get in trouble. Could you help me get him scheduled? Thank you CB # [phone]

## 2024-10-05 DIAGNOSIS — E785 Hyperlipidemia, unspecified: Secondary | ICD-10-CM | POA: Insufficient documentation

## 2024-10-15 ENCOUNTER — Ambulatory Visit: Admitting: Cardiology
